# Patient Record
Sex: Female | Born: 2000 | Race: White | Hispanic: No | Marital: Single | Smoking: Former smoker
Health system: Southern US, Community
[De-identification: ages and names within clinical notes are randomized; demographics above are authoritative.]

## PROBLEM LIST (undated history)

## (undated) ENCOUNTER — Inpatient Hospital Stay (HOSPITAL_COMMUNITY): Payer: Self-pay

## (undated) DIAGNOSIS — N289 Disorder of kidney and ureter, unspecified: Secondary | ICD-10-CM

## (undated) DIAGNOSIS — Z8742 Personal history of other diseases of the female genital tract: Secondary | ICD-10-CM

## (undated) DIAGNOSIS — R51 Headache: Secondary | ICD-10-CM

## (undated) DIAGNOSIS — F32A Depression, unspecified: Secondary | ICD-10-CM

## (undated) DIAGNOSIS — R339 Retention of urine, unspecified: Secondary | ICD-10-CM

## (undated) DIAGNOSIS — R000 Tachycardia, unspecified: Secondary | ICD-10-CM

## (undated) DIAGNOSIS — E282 Polycystic ovarian syndrome: Secondary | ICD-10-CM

## (undated) DIAGNOSIS — F419 Anxiety disorder, unspecified: Secondary | ICD-10-CM

## (undated) DIAGNOSIS — R519 Headache, unspecified: Secondary | ICD-10-CM

## (undated) DIAGNOSIS — R87629 Unspecified abnormal cytological findings in specimens from vagina: Secondary | ICD-10-CM

## (undated) HISTORY — DX: Retention of urine, unspecified: R33.9

## (undated) HISTORY — DX: Headache, unspecified: R51.9

## (undated) HISTORY — DX: Tachycardia, unspecified: R00.0

## (undated) HISTORY — PX: NO PAST SURGERIES: SHX2092

## (undated) HISTORY — DX: Headache: R51

---
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
## 2018-01-07 ENCOUNTER — Ambulatory Visit (INDEPENDENT_AMBULATORY_CARE_PROVIDER_SITE_OTHER): Payer: BLUE CROSS/BLUE SHIELD | Admitting: Pediatrics

## 2018-01-07 ENCOUNTER — Encounter (INDEPENDENT_AMBULATORY_CARE_PROVIDER_SITE_OTHER): Payer: Self-pay | Admitting: Pediatrics

## 2018-01-07 VITALS — BP 98/80 | HR 72 | Ht 63.5 in | Wt 133.4 lb

## 2018-01-07 DIAGNOSIS — G44219 Episodic tension-type headache, not intractable: Secondary | ICD-10-CM

## 2018-01-07 DIAGNOSIS — G43009 Migraine without aura, not intractable, without status migrainosus: Secondary | ICD-10-CM

## 2018-01-07 MED ORDER — QUDEXY XR 100 MG PO CS24: EXTENDED_RELEASE_CAPSULE | ORAL | 5 refills | Status: DC

## 2018-01-07 NOTE — Patient Instructions (Signed)
Glad that your headaches are better.  I am concerned about the tingling in your hands.  That reason we are going to switch you to Qudexy or Trokendi.  These are extended release topiramate.  Please check with your pharmacist to see if the co-pay card significantly reduces how much she has to spend.  If we could need to go to the generic I will not hesitate to do so.  I want to know if this gets rid of your numbness and how well is helping her headaches.

## 2018-01-07 NOTE — Progress Notes (Signed)
Patient: Hannah Walker MRN: [id number] Sex: female DOB: 2000/04/11  Provider: Ellison Carwin, MD Location of Care: [facility]  Note type: Routine return visit  History of Present Illness: Referral Source: Suzanna Obey, MD History from: patient and [facility] chart Chief Complaint: Headaches  Hannah Walker is a 17 y.o. female who was evaluated on January 07, 2018 for the first time since October 22, 2017.  The patient has a history of new daily persistent.  She has persistent tension-type headaches and migraines.  Her headaches have dropped down to 2 to 3 per week.  Some are migrainous and others are tension type in nature.  She is having numbness in her hands in a glove distribution that is most prominent when she wakes up in the morning and is in a glove distribution for both hands.  I am certain it is related to topiramate.  The symptoms subside through the day.  Currently, she takes 100 mg of topiramate immediate release with 25 mg tablets.  About once a week, she has a headache that puts her to bed.  About once a month or so she has a headache that causes vomiting.  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  She sleeps 8 hours a night, but often works 16 hour shifts.  Fortunately, despite the long hours, she is doing well.  She wants to go to nursing school and I hope is saving some money and after working for year will be supported by her employers to obtain further education.  In general, her health is good.  She has gained about 7 pounds since I saw her a few months ago.  She has had no other significant medical problems.  Review of Systems: A complete review of systems was remarkable for patient reports that she is down to 2 or 3 headaches a week. She reports that when she takes the Topirimate, her fingers become numb, all other systems reviewed and negative.  Past Medical History Diagnosis Date  . Headache    Hospitalizations:  No., Head Injury: No., Nervous System Infections: No., Immunizations up to date: Yes.    Birth History 6 Lbs.7oz. infant born at [redacted]weeks gestational age to a 19year old g 1p 86female. Gestation wascomplicated bypreterm labor Normal spontaneous vaginal delivery Nursery Course wascomplicated bypremature lungs requiring surfactant Growth and Development wasrecalled asnormal  Behavior History none  Surgical History History reviewed. No pertinent surgical history.  Family History family history is not on file. Family history is negative for migraines, seizures, intellectual disabilities, blindness, deafness, birth defects, chromosomal disorder, or autism.  Social History Socioeconomic History  . Marital status: Single  . Years of education:  35  . Highest education level:  High school graduate  Occupational History  .  Works part-time in an assisted living facility  Social Needs  . Financial resource strain: Not on file  . Food insecurity:    Worry: Not on file    Inability: Not on file  . Transportation needs:    Medical: Not on file    Non-medical: Not on file  Tobacco Use  . Smoking status: Never Smoker  . Smokeless tobacco: Never Used  Substance and Sexual Activity  . Alcohol use: No  . Drug use: No  . 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    She was home schooled.    She  lives with her dad only. She has no siblings.    She enjoys playing with her dogs and being on her phone.   Allergies Allergen Reactions  . Ampicillin Rash    Has patient had a PCN reaction causing immediate rash, facial/tongue/throat swelling, SOB or lightheadedness with hypotension: Yes Has patient had a PCN reaction causing severe rash involving mucus membranes or skin necrosis: No Has patient had a PCN reaction that required hospitalization: No Has patient had a PCN reaction occurring within the last 10 years: Yes If all  of the above answers are "NO", then may proceed with Cephalosporin use.    Physical Exam BP 98/80   Pulse 72   Ht 5' 3.5" (1.613 m)   Wt 133 lb 6.4 oz (60.5 kg)   BMI 23.26 kg/m   General: alert, well developed, well nourished, in no acute distress, sandy hair, brown eyes, right handed Head: normocephalic, no dysmorphic features Ears, Nose and Throat: Otoscopic: tympanic membranes normal; pharynx: oropharynx is pink without exudates or tonsillar hypertrophy Neck: supple, full range of motion, no cranial or cervical bruits Respiratory: auscultation clear Cardiovascular: no murmurs, pulses are normal Musculoskeletal: no skeletal deformities or apparent scoliosis Skin: no rashes or neurocutaneous lesions  Neurologic Exam  Mental Status: alert; oriented to person, place and year; knowledge is normal for age; language is normal Cranial Nerves: visual fields are full to double simultaneous stimuli; extraocular movements are full and conjugate; pupils are round reactive to light; funduscopic examination shows sharp disc margins with normal vessels; symmetric facial strength; midline tongue and uvula; air conduction is greater than bone conduction bilaterally Motor: Normal strength, tone and mass; good fine motor movements; no pronator drift Sensory: intact responses to cold, vibration, proprioception and stereognosis Coordination: good finger-to-nose, rapid repetitive alternating movements and finger apposition Gait and Station: normal gait and station: patient is able to walk on heels, toes and tandem without difficulty; balance is adequate; Romberg exam is negative; Gower response is negative Reflexes: symmetric and diminished bilaterally; no clonus; bilateral flexor plantar responses  Assessment 1. Migraine without aura without status migrainosus, not intractable, G43.009. 2. Episodic tension-type headache, not intractable, G44.219.  Discussion I think that we are making progress with  the topiramate but I do not believe that we can push immediate release.  I have given her co-pay cards for Trokendi and Qudexy and written a prescription for Qudexy ER 100 mg tablets.  I asked her to speak with her pharmacist and find out which would work out best for her.  If both of the trade drugs do not work, then we will go to the generic in which case Qudexy will be fine.  Plan It is my hope that we will continue to see improvement in her headaches and diminution of her numbness.  We will continue her current treatment which includes rescue drugs with tizanidine, eletriptan, Zofran as needed for nausea and vomiting.  I asked her to limit her NSAID use which she has done.  She will return to see me in 3 months' time and will send me her headache calendars for mid July through September.  Greater than 50% of a 25 minute visit was spent in counseling and coordination of care concerning her headaches and dealing with side effects.   Medication List    Accurate as of 01/07/18  2:03 PM.      cetirizine 10 MG tablet Commonly known as:  ZYRTEC Take 1 tablet (10 mg total) by mouth daily.  eletriptan 40 MG tablet Commonly known as:  RELPAX Take 1 tablet (40 mg total) by mouth as needed for migraine or headache. May repeat in 2 hours if headache persists or recurs.   ibuprofen 200 MG tablet Commonly known as:  ADVIL,MOTRIN Take 400 mg by mouth every 6 (six) hours as needed for mild pain or moderate pain.   ipratropium 0.06 % nasal spray Commonly known as:  ATROVENT Place 2 sprays into both nostrils 4 (four) times daily.   ondansetron 4 MG tablet Commonly known as:  ZOFRAN Take 1 tablet as needed for nausea, no more than once daily   tiZANidine 4 MG tablet Commonly known as:  ZANAFLEX TAKE 1 TABLET BY MOUTH AT BEDTIME AS NEEDED FOR SEVERE PAIN   topiramate 25 MG tablet Commonly known as:  TOPAMAX Take 4 tablets at nighttime   TRI-SPRINTEC 0.18/0.215/0.25 MG-35 MCG tablet Generic  drug:  Norgestimate-Ethinyl Estradiol Triphasic TK 1 T PO D    The medication list was reviewed and reconciled. All changes or newly prescribed medications were explained.  A complete medication list was provided to the patient/caregiver.  Deetta Perla MD

## 2018-01-21 ENCOUNTER — Other Ambulatory Visit: Payer: Self-pay

## 2018-01-21 ENCOUNTER — Encounter: Payer: Self-pay | Admitting: Emergency Medicine

## 2018-01-21 ENCOUNTER — Emergency Department: Payer: BLUE CROSS/BLUE SHIELD

## 2018-01-21 ENCOUNTER — Ambulatory Visit
Admission: EM | Admit: 2018-01-21 | Discharge: 2018-01-21 | Disposition: A | Discharge status: Home / Self Care | Payer: BLUE CROSS/BLUE SHIELD | Attending: Emergency Medicine | Admitting: Emergency Medicine

## 2018-01-21 ENCOUNTER — Emergency Department
Admission: EM | Admit: 2018-01-21 | Discharge: 2018-01-21 | Disposition: A | Discharge status: Home / Self Care | Payer: BLUE CROSS/BLUE SHIELD | Attending: Emergency Medicine | Admitting: Emergency Medicine

## 2018-01-21 DIAGNOSIS — B9689 Other specified bacterial agents as the cause of diseases classified elsewhere: Secondary | ICD-10-CM | POA: Diagnosis not present

## 2018-01-21 DIAGNOSIS — Z87442 Personal history of urinary calculi: Secondary | ICD-10-CM | POA: Insufficient documentation

## 2018-01-21 DIAGNOSIS — N83201 Unspecified ovarian cyst, right side: Secondary | ICD-10-CM | POA: Insufficient documentation

## 2018-01-21 DIAGNOSIS — Z79899 Other long term (current) drug therapy: Secondary | ICD-10-CM | POA: Insufficient documentation

## 2018-01-21 DIAGNOSIS — E282 Polycystic ovarian syndrome: Secondary | ICD-10-CM | POA: Insufficient documentation

## 2018-01-21 DIAGNOSIS — R1031 Right lower quadrant pain: Secondary | ICD-10-CM | POA: Insufficient documentation

## 2018-01-21 DIAGNOSIS — R102 Pelvic and perineal pain: Secondary | ICD-10-CM | POA: Diagnosis present

## 2018-01-21 DIAGNOSIS — N760 Acute vaginitis: Secondary | ICD-10-CM | POA: Diagnosis not present

## 2018-01-21 HISTORY — DX: Disorder of kidney and ureter, unspecified: N28.9

## 2018-01-21 LAB — CBC WITH DIFFERENTIAL/PLATELET
Abs Immature Granulocytes: 0.02 10*3/uL (ref 0.00–0.07)
BASOS ABS: 0 10*3/uL (ref 0.0–0.1)
Basophils Relative: 0 %
Eosinophils Absolute: 0.2 10*3/uL (ref 0.0–0.5)
Eosinophils Relative: 2 %
HEMATOCRIT: 40 % (ref 36.0–46.0)
Hemoglobin: 13.7 g/dL (ref 12.0–15.0)
Immature Granulocytes: 0 %
LYMPHS ABS: 3.2 10*3/uL (ref 0.7–4.0)
LYMPHS PCT: 36 %
MCH: 29.6 pg (ref 26.0–34.0)
MCHC: 34.3 g/dL (ref 30.0–36.0)
MCV: 86.4 fL (ref 80.0–100.0)
Monocytes Absolute: 0.6 10*3/uL (ref 0.1–1.0)
Monocytes Relative: 7 %
NEUTROS ABS: 4.9 10*3/uL (ref 1.7–7.7)
NEUTROS PCT: 55 %
NRBC: 0 % (ref 0.0–0.2)
Platelets: 241 10*3/uL (ref 150–400)
RBC: 4.63 MIL/uL (ref 3.87–5.11)
RDW: 13 % (ref 11.5–15.5)
WBC: 8.9 10*3/uL (ref 4.0–10.5)

## 2018-01-21 LAB — CHLAMYDIA/NGC RT PCR (ARMC ONLY)
Chlamydia Tr: NOT DETECTED
N gonorrhoeae: NOT DETECTED

## 2018-01-21 LAB — WET PREP, GENITAL
Sperm: NONE SEEN
TRICH WET PREP: NONE SEEN
Yeast Wet Prep HPF POC: NONE SEEN

## 2018-01-21 LAB — URINALYSIS, COMPLETE (UACMP) WITH MICROSCOPIC
BILIRUBIN URINE: NEGATIVE
Glucose, UA: NEGATIVE mg/dL
Hgb urine dipstick: NEGATIVE
Ketones, ur: NEGATIVE mg/dL
Leukocytes, UA: NEGATIVE
NITRITE: NEGATIVE
PH: 5.5 (ref 5.0–8.0)
Protein, ur: NEGATIVE mg/dL
RBC / HPF: NONE SEEN RBC/hpf (ref 0–5)
SPECIFIC GRAVITY, URINE: 1.02 (ref 1.005–1.030)

## 2018-01-21 LAB — BASIC METABOLIC PANEL
Anion gap: 6 (ref 5–15)
BUN: 16 mg/dL (ref 6–20)
CHLORIDE: 108 mmol/L (ref 98–111)
CO2: 22 mmol/L (ref 22–32)
CREATININE: 0.68 mg/dL (ref 0.44–1.00)
Calcium: 10 mg/dL (ref 8.9–10.3)
GFR calc non Af Amer: >60 mL/min (ref >60)
Glucose, Bld: 109 mg/dL — ABNORMAL HIGH (ref 70–99)
Potassium: 3.5 mmol/L (ref 3.5–5.1)
Sodium: 136 mmol/L (ref 135–145)

## 2018-01-21 LAB — PREGNANCY, URINE: PREG TEST UR: NEGATIVE

## 2018-01-21 MED ORDER — KETOROLAC TROMETHAMINE 60 MG/2ML IM SOLN
30.0000 mg | Freq: Once | INTRAMUSCULAR | Status: AC
  Administered 2018-01-21: 30 mg via INTRAMUSCULAR

## 2018-01-21 MED ORDER — METRONIDAZOLE 500 MG PO TABS: 500.0000 mg | ORAL_TABLET | Freq: Two times a day (BID) | ORAL | 0 refills | Status: AC

## 2018-01-21 NOTE — ED Triage Notes (Signed)
Pt reports that she was at [facility] for abd pain, they did a pelvic, and labs on her, they sent here her because they think her ovaries may giving her the pain or that she may have a kidney stone. They do not have [place] or CT so they sent her her to be evaluated.

## 2018-01-21 NOTE — Discharge Instructions (Addendum)
Go immediately to the [facility] emergency department.  Do not have anything to eat or drink until your evaluation is complete.  You also have bacterial vaginosis, which is not an STD.  It is treated with Flagyl, so I am sending you home with a week of Flagyl.  Do not drink alcohol while taking this medicine.

## 2018-01-21 NOTE — ED Provider Notes (Signed)
HPI  SUBJECTIVE:  Hannah Walker is a 17 y.o. female who presents with 1 week of stabbing, "punching" intermittent right lower quadrant pain that radiates to her right back.  States that the pain is getting more intense, it has not moved since it started.  It lasts approximately 30 minutes and then resolves.  She also reports dysuria, urgency, frequency, hematuria, fevers T-max 100.  Decreased appetite.  She reports increased amount nonodorous, white vaginal discharge.  No vomiting.  No vaginal bleeding, odor, genital rash, itching.  No intercourse in the past week.  She is in a long-term monogamous relationship with a female who is asymptomatic, STDs are not a concern today.  She states that initially this felt like a urinary tract infection, but then it now feels more like a kidney stone.  She is on day #4 of a leftover Keflex scription 500 mg twice daily.  Symptoms are better with moving around, worse with lying down/sitting still.  She states that the car ride over here was painful.  She has a past medical history of UTIs, nonobstructing nephrolithiasis was confirmed on ultrasound.  No history of pyelonephritis.  Also history of migraines, PCOS.  She states that this does not feel like a ruptured cyst.  No history of diabetes, hypertension, abdominal surgeries, gonorrhea, chlamydia, HIV, HSV, syphilis, Trichomonas, BV, yeast.  No history of ectopic pregnancies, PID.  LMP: 10/13.  PMD: None.    Past Medical History:  Diagnosis Date  . Headache   . Renal disorder    kidney stones    Past Surgical History:  Procedure Laterality Date  . NO PAST SURGERIES      Family History  Problem Relation Age of Onset  . Healthy Mother   . Healthy Father     Social History   Tobacco Use  . Smoking status: Never Smoker  . Smokeless tobacco: Never Used  Substance Use Topics  . Alcohol use: No  . Drug use: No    No current facility-administered medications for this encounter.   Current Outpatient  Medications:  .  cetirizine (ZYRTEC) 10 MG tablet, Take 1 tablet (10 mg total) by mouth daily., Disp: 15 tablet, Rfl: 0 .  eletriptan (RELPAX) 40 MG tablet, Take 1 tablet (40 mg total) by mouth as needed for migraine or headache. May repeat in 2 hours if headache persists or recurs., Disp: 10 tablet, Rfl: 5 .  ipratropium (ATROVENT) 0.06 % nasal spray, Place 2 sprays into both nostrils 4 (four) times daily., Disp: 15 mL, Rfl: 0 .  ondansetron (ZOFRAN) 4 MG tablet, Take 1 tablet as needed for nausea, no more than once daily, Disp: 20 tablet, Rfl: 5 .  QUDEXY XR CS24 sprinkle capsule, Take 1 tablet at nighttime., Disp: 30 each, Rfl: 5 .  tiZANidine (ZANAFLEX) 4 MG tablet, TAKE 1 TABLET BY MOUTH AT BEDTIME AS NEEDED FOR SEVERE PAIN, Disp: 10 tablet, Rfl: 5 .  TRI-SPRINTEC 0.18/0.215/0.25 MG-35 MCG tablet, TK 1 T PO D, Disp: , Rfl: 12 .  metroNIDAZOLE (FLAGYL) 500 MG tablet, Take 1 tablet (500 mg total) by mouth 2 (two) times daily for 7 days., Disp: 14 tablet, Rfl: 0  Allergies  Allergen Reactions  . Ampicillin Rash    Has patient had a PCN reaction causing immediate rash, facial/tongue/throat swelling, SOB or lightheadedness with hypotension: Yes Has patient had a PCN reaction causing severe rash involving mucus membranes or skin necrosis: No Has patient had a PCN reaction that required hospitalization: No Has  patient had a PCN reaction occurring within the last 10 years: Yes If all of the above answers are "NO", then may proceed with Cephalosporin use.      ROS  As noted in HPI.   Physical Exam  BP 105/65 (BP Location: Left Arm)   Pulse 98   Temp 98.2 F (36.8 C) (Oral)   Resp 18   Ht 5\' 5"  (1.651 m)   Wt 59.9 kg   LMP 01/11/2018 (Approximate)   SpO2 99%   BMI 21.97 kg/m   Constitutional: Well developed, well nourished, no acute distress Eyes:  EOMI, conjunctiva normal bilaterally HENT: Normocephalic, atraumatic,mucus membranes moist Respiratory: Normal inspiratory  effort Cardiovascular: Normal rate GI: Normal appearance, soft, positive right lower quadrant tenderness with no guarding or rebound.  Negative Murphy, negative Rovsing.  Active bowel sounds.  Nondistended.  No flank tenderness Back: Mild right CVAT Pelvic: Normal external genitalia.  Normal os, scant white vaginal discharge.  Uterus nontender.  No CMT.  No appreciable adnexal masses.  Positive right adnexal tenderness.  Chaperone present during exam. skin: No rash, skin intact Musculoskeletal: no deformities Neurologic: Alert & oriented x 3, no focal neuro deficits Psychiatric: Speech and behavior appropriate   ED Course   Medications  ketorolac (TORADOL) injection 30 mg (30 mg Intramuscular Given 01/21/18 1142)    Orders Placed This Encounter  Procedures  . Pelvic exam    Standing Status:   Standing    Number of Occurrences:   1  . Wet prep, genital    Standing Status:   Standing    Number of Occurrences:   1    Order Specific Question:   Patient immune status    Answer:   Normal  . Chlamydia/NGC rt PCR    Standing Status:   Standing    Number of Occurrences:   1    Order Specific Question:   Patient immune status    Answer:   Normal  . Urine culture    Standing Status:   Standing    Number of Occurrences:   1    Order Specific Question:   Patient immune status    Answer:   Normal  . Urinalysis, Complete w Microscopic    Standing Status:   Standing    Number of Occurrences:   1  . CBC with Differential    Standing Status:   Standing    Number of Occurrences:   1  . Basic metabolic panel    Standing Status:   Standing    Number of Occurrences:   1  . Pregnancy, urine    Standing Status:   Standing    Number of Occurrences:   1    Results for orders placed or performed during the hospital encounter of 01/21/18 (from the past 24 hour(s))  Urinalysis, Complete w Microscopic     Status: Abnormal   Collection Time: 01/21/18 10:14 AM  Result Value Ref Range   Color,  Urine YELLOW YELLOW   APPearance CLEAR CLEAR   Specific Gravity, Urine 1.020 1.005 - 1.030   pH 5.5 5.0 - 8.0   Glucose, UA NEGATIVE NEGATIVE mg/dL   Hgb urine dipstick NEGATIVE NEGATIVE   Bilirubin Urine NEGATIVE NEGATIVE   Ketones, ur NEGATIVE NEGATIVE mg/dL   Protein, ur NEGATIVE NEGATIVE mg/dL   Nitrite NEGATIVE NEGATIVE   Leukocytes, UA NEGATIVE NEGATIVE   Squamous Epithelial / LPF 0-5 0 - 5   WBC, UA 0-5 0 - 5 WBC/hpf  RBC / HPF NONE SEEN 0 - 5 RBC/hpf   Bacteria, UA RARE (A) NONE SEEN  Pregnancy, urine     Status: None   Collection Time: 01/21/18 10:14 AM  Result Value Ref Range   Preg Test, Ur NEGATIVE NEGATIVE  CBC with Differential     Status: None   Collection Time: 01/21/18 10:59 AM  Result Value Ref Range   WBC 8.9 4.0 - 10.5 K/uL   RBC 4.63 3.87 - 5.11 MIL/uL   Hemoglobin 13.7 12.0 - 15.0 g/dL   HCT 40.9 81.1 - 91.4 %   MCV 86.4 80.0 - 100.0 fL   MCH 29.6 26.0 - 34.0 pg   MCHC 34.3 30.0 - 36.0 g/dL   RDW 78.2 95.6 - 21.3 %   Platelets 241 150 - 400 K/uL   nRBC 0.0 0.0 - 0.2 %   Neutrophils Relative % 55 %   Neutro Abs 4.9 1.7 - 7.7 K/uL   Lymphocytes Relative 36 %   Lymphs Abs 3.2 0.7 - 4.0 K/uL   Monocytes Relative 7 %   Monocytes Absolute 0.6 0.1 - 1.0 K/uL   Eosinophils Relative 2 %   Eosinophils Absolute 0.2 0.0 - 0.5 K/uL   Basophils Relative 0 %   Basophils Absolute 0.0 0.0 - 0.1 K/uL   Immature Granulocytes 0 %   Abs Immature Granulocytes 0.02 0.00 - 0.07 K/uL  Basic metabolic panel     Status: Abnormal   Collection Time: 01/21/18 10:59 AM  Result Value Ref Range   Sodium 136 135 - 145 mmol/L   Potassium 3.5 3.5 - 5.1 mmol/L   Chloride 108 98 - 111 mmol/L   CO2 22 22 - 32 mmol/L   Glucose, Bld 109 (H) 70 - 99 mg/dL   BUN 16 6 - 20 mg/dL   Creatinine, Ser 0.86 0.44 - 1.00 mg/dL   Calcium 57.8 8.9 - 46.9 mg/dL   GFR calc non Af Amer >60 >60 mL/min   GFR calc Af Amer >60 >60 mL/min   Anion gap 6 5 - 15  Wet prep, genital     Status:  Abnormal   Collection Time: 01/21/18 10:59 AM  Result Value Ref Range   Yeast Wet Prep HPF POC NONE SEEN NONE SEEN   Trich, Wet Prep NONE SEEN NONE SEEN   Clue Cells Wet Prep HPF POC PRESENT (A) NONE SEEN   WBC, Wet Prep HPF POC MANY (A) NONE SEEN   Sperm NONE SEEN    No results found.  ED Clinical Impression  Right lower quadrant abdominal pain  BV (bacterial vaginosis)   ED Assessment/Plan  In the differential is nephrolithiasis, ruptured ovarian cyst, appendicitis, much lower on the differential is ovarian torsion.  Doubt PID.  Checking UA, urine pregnancy, CBC, BMP, wet prep, gonorrhea, chlamydia.  Do not think that we need to treat empirically for gonorrhea chlamydia today.  Giving Toradol 30 mg IM.  Patient declined nausea medicine.  UA with rare bacteria, negative for UTI, hematuria.  Sending off for culture to confirm absence of UTI.  BMP, CBC normal.  Patient has BV.  Urine pregnancy negative.  Sending to the [facility] emergency department.  I think that the patient would benefit from additional work-up/advanced imaging to rule out the above.  We will give patient copy of her labs and also prescription for Flagyl for the BV.  Discussed labs, MDM, treatment plan, and rationale for transfer to the emergency department via private vehicle with the patient.  She agrees with plan.   Meds ordered this encounter  Medications  . ketorolac (TORADOL) injection 30 mg  . metroNIDAZOLE (FLAGYL) 500 MG tablet    Sig: Take 1 tablet (500 mg total) by mouth 2 (two) times daily for 7 days.    Dispense:  14 tablet    Refill:  0    *This clinic note was created using Scientist, clinical (histocompatibility and immunogenetics). Therefore, there may be occasional mistakes despite careful proofreading.   ?    Domenick Gong, MD 01/21/18 1155

## 2018-01-21 NOTE — ED Provider Notes (Signed)
[facility] Emergency Department Provider Note  Time seen: 3:02 PM  I have reviewed the triage vital signs and the nursing notes.   HISTORY  Chief Complaint Abdominal Pain    HPI Hannah Walker is a 17 y.o. female the past medical history of migraines, presents emergency department for right lower quadrant abdominal pain.  According to the patient for the past 1 week she has had fairly constant pain in her right pelvis.  States a very slight amount of vaginal discharge.  Denies any bleeding.  Denies any hematuria, did state a slight burning sensation earlier in the week which is resolved.  No nausea vomiting or diarrhea.  No known fever.  Describes the pain as a sharp type pain located very low in the right lower quadrant.   Past Medical History:  Diagnosis Date  . Headache   . Renal disorder    kidney stones    Patient Active Problem List   Diagnosis Date Noted  . Episodic tension-type headache, not intractable 01/07/2018  . Migraine without aura and without status migrainosus, not intractable 07/23/2017  . New daily persistent headache 02/27/2017  . Concussion without loss of consciousness 02/27/2017    Past Surgical History:  Procedure Laterality Date  . NO PAST SURGERIES      Prior to Admission medications   Medication Sig Start Date End Date Taking? Authorizing Provider  cetirizine (ZYRTEC) 10 MG tablet Take 1 tablet (10 mg total) by mouth daily. 10/27/17   Cathie Hoops, Amy V, PA-C  eletriptan (RELPAX) 40 MG tablet Take 1 tablet (40 mg total) by mouth as needed for migraine or headache. May repeat in 2 hours if headache persists or recurs. 10/22/17   Deetta Perla, MD  ipratropium (ATROVENT) 0.06 % nasal spray Place 2 sprays into both nostrils 4 (four) times daily. 10/27/17   Cathie Hoops, Amy V, PA-C  metroNIDAZOLE (FLAGYL) 500 MG tablet Take 1 tablet (500 mg total) by mouth 2 (two) times daily for 7 days. 01/21/18 01/28/18  Domenick Gong, MD  ondansetron  (ZOFRAN) 4 MG tablet Take 1 tablet as needed for nausea, no more than once daily 10/22/17   Deetta Perla, MD  QUDEXY XR CS24 sprinkle capsule Take 1 tablet at nighttime. 01/07/18   Deetta Perla, MD  tiZANidine (ZANAFLEX) 4 MG tablet TAKE 1 TABLET BY MOUTH AT BEDTIME AS NEEDED FOR SEVERE PAIN 10/22/17   Deetta Perla, MD  TRI-SPRINTEC 0.18/0.215/0.25 MG-35 MCG tablet TK 1 T PO D 03/27/17   [provider]    Allergies  Allergen Reactions  . Ampicillin Rash    Has patient had a PCN reaction causing immediate rash, facial/tongue/throat swelling, SOB or lightheadedness with hypotension: Yes Has patient had a PCN reaction causing severe rash involving mucus membranes or skin necrosis: No Has patient had a PCN reaction that required hospitalization: No Has patient had a PCN reaction occurring within the last 10 years: Yes If all of the above answers are "NO", then may proceed with Cephalosporin use.     Family History  Problem Relation Age of Onset  . Healthy Mother   . Healthy Father     Social History Social History   Tobacco Use  . Smoking status: Never Smoker  . Smokeless tobacco: Never Used  Substance Use Topics  . Alcohol use: No  . Drug use: No    Review of Systems Constitutional: Negative for fever. Cardiovascular: Negative for chest pain. Respiratory: Negative for shortness of breath.  Gastrointestinal: Right lower quadrant/right pelvis pain.  Negative for nausea vomiting diarrhea Genitourinary: Negative for dysuria/hematuria Musculoskeletal: Negative for musculoskeletal complaints Skin: Negative for skin complaints  Neurological: Negative for headache All other ROS negative  ____________________________________________   PHYSICAL EXAM:  VITAL SIGNS: ED Triage Vitals  Enc Vitals Group     BP 01/21/18 1227 119/70     Pulse Rate 01/21/18 1227 98     Resp 01/21/18 1227 20     Temp 01/21/18 1227 97.7 F (36.5 C)     Temp Source  01/21/18 1227 Oral     SpO2 01/21/18 1227 99 %     Weight 01/21/18 1228 132 lb (59.9 kg)     Height 01/21/18 1228 5\' 5"  (1.651 m)     Head Circumference --      Peak Flow --      Pain Score 01/21/18 1228 4     Pain Loc --      Pain Edu? --      Excl. in GC? --    Constitutional: Alert and oriented. Well appearing and in no distress. Eyes: Normal exam ENT   Head: Normocephalic and atraumatic.   Mouth/Throat: Mucous membranes are moist. Cardiovascular: Normal rate, regular rhythm. No murmur Respiratory: Normal respiratory effort without tachypnea nor retractions. Breath sounds are clear Gastrointestinal: Soft, mild to moderate tenderness in the very low right lower quadrant/right pelvis.  No significant discomfort over McBurney's point.  Abdomen otherwise benign.  No rebound guarding or distention. Musculoskeletal: Nontender with normal range of motion in all extremities. Neurologic:  Normal speech and language. No gross focal neurologic deficits Skin:  Skin is warm, dry and intact.  Psychiatric: Mood and affect are normal.   ____________________________________________    RADIOLOGY  Ultrasound shows 3.5 cm hemorrhagic cyst of the right ovary.  ____________________________________________   INITIAL IMPRESSION / ASSESSMENT AND PLAN / ED COURSE  Pertinent labs & imaging results that were available during my care of the patient were reviewed by me and considered in my medical decision making (see chart for details).  Patient presents the emergency department for very low right lower quadrant pain/right pelvis pain ongoing x1 week.  Patient was seen earlier today admit been urgent care and referred to the emergency department.  Patient had lab work performed at [facility] urgent care showing normal results including a normal chemistry and largely normal CBC with a normal white blood cell count.  Urinalysis also appeared normal.  Patient had a vaginal exam performed with wet prep  showing bacterial vaginitis and she was prescribed Flagyl.  I have reviewed the patient's notes and the patient was also tested for gonorrhea/chlamydia which is pending.  Given the patient's tenderness across the very low right lower quadrant/right pelvis I believe the patient would benefit from an ultrasound to help rule out ovarian pathology such as cyst, hemorrhagic cyst or torsion.  I believe appendicitis is less likely given no fever, normal white blood cell count and symptoms ongoing x1 week.  Does not appear suspicious for ureterolithiasis given no hematuria constant type pain.  We will obtain ultrasound and reassess.  Ultrasound shows likely hemorrhagic cyst of the right ovary.  I believe this explains the patient's symptoms quite well as well as clinical exam findings.  Discussed the findings with patient as well as mother, recommend repeat ultrasound in 6 to 12 weeks to ensure that the cyst has resolved.  ____________________________________________   FINAL CLINICAL IMPRESSION(S) / ED DIAGNOSES  Right pelvis pain Hemorrhagic cyst.  Minna Antis, MD 01/21/18 [phone]

## 2018-01-21 NOTE — Discharge Instructions (Addendum)
Please use Tylenol or ibuprofen as needed for pain/discomfort as written on the box.  Please follow-up with your OB/GYN or primary care doctor for a repeat ultrasound in 6 to 12 weeks to ensure resolution of your ovarian cyst.

## 2018-01-21 NOTE — ED Triage Notes (Signed)
Pt c/o lower back pain, pelvic pain on the right side, dysuria, nausea, urinary frequency, urinary rentention, and low grade fevers. Started about a week ago. Pt has a h/o kidney stones

## 2018-01-22 ENCOUNTER — Telehealth: Payer: Self-pay | Admitting: Emergency Medicine

## 2018-01-22 NOTE — Telephone Encounter (Signed)
GC/C are negative.  Urine culture still pending call when all results are in.

## 2018-01-23 ENCOUNTER — Telehealth: Payer: Self-pay | Admitting: Emergency Medicine

## 2018-01-23 LAB — URINE CULTURE: Special Requests: NORMAL

## 2018-01-23 NOTE — Telephone Encounter (Signed)
Called patient, demographics verified, results communicated, patient states she is still having some painful urination.  Patient encouraged to follow up, patient states she will follow back up with [place] or with her OBGYN.

## 2018-01-26 ENCOUNTER — Encounter (INDEPENDENT_AMBULATORY_CARE_PROVIDER_SITE_OTHER): Payer: Self-pay

## 2018-01-27 NOTE — Telephone Encounter (Signed)
I spoke with [facility].  She is having significant breakthrough bleeding at a time when she is not having her menstrual.  She also had a ruptured ovarian cyst.  I do not believe that Qudexy has anything to do with either of these.  She did not have any problems with topiramate immediate release.  I recommended that she continue the medication and see her GYN as planned on November 4 check to be certain that she is not significantly anemic and inform me if the bleeding continues on.  Ultimately the only way Qudexy could be affecting her bleeding is if it somehow affecting her birth control.

## 2018-02-15 ENCOUNTER — Other Ambulatory Visit: Payer: Self-pay

## 2018-02-15 ENCOUNTER — Ambulatory Visit (HOSPITAL_COMMUNITY)
Admission: EM | Admit: 2018-02-15 | Discharge: 2018-02-15 | Disposition: A | Discharge status: Home / Self Care | Payer: BLUE CROSS/BLUE SHIELD | Attending: Family Medicine | Admitting: Family Medicine

## 2018-02-15 ENCOUNTER — Encounter (HOSPITAL_COMMUNITY): Payer: Self-pay | Admitting: Family Medicine

## 2018-02-15 DIAGNOSIS — R05 Cough: Secondary | ICD-10-CM | POA: Diagnosis present

## 2018-02-15 DIAGNOSIS — J069 Acute upper respiratory infection, unspecified: Secondary | ICD-10-CM | POA: Diagnosis not present

## 2018-02-15 DIAGNOSIS — B9789 Other viral agents as the cause of diseases classified elsewhere: Secondary | ICD-10-CM | POA: Diagnosis not present

## 2018-02-15 DIAGNOSIS — J029 Acute pharyngitis, unspecified: Secondary | ICD-10-CM | POA: Insufficient documentation

## 2018-02-15 LAB — POCT RAPID STREP A: Streptococcus, Group A Screen (Direct): NEGATIVE

## 2018-02-15 MED ORDER — BENZONATATE 100 MG PO CAPS: 100.0000 mg | ORAL_CAPSULE | Freq: Three times a day (TID) | ORAL | 0 refills | Status: DC | PRN

## 2018-02-15 NOTE — Discharge Instructions (Signed)
Strep test is negative.

## 2018-02-15 NOTE — ED Provider Notes (Signed)
[facility]    CSN: [id number] Arrival date & time: 02/15/18  1216     History   Chief Complaint Chief Complaint  Patient presents with  . Cough  . Sore Throat    HPI [facility] is a 17 y.o. female.   [facility] with a complaint of a sore throat, cough and nasal congestion x 3 days.  Patient works as a [place]  She is accompanied by her mother today.     Past Medical History:  Diagnosis Date  . Headache   . Renal disorder    kidney stones    Patient Active Problem List   Diagnosis Date Noted  . Episodic tension-type headache, not intractable 01/07/2018  . Migraine without aura and without status migrainosus, not intractable 07/23/2017  . New daily persistent headache 02/27/2017  . Concussion without loss of consciousness 02/27/2017    Past Surgical History:  Procedure Laterality Date  . NO PAST SURGERIES      OB History   None      Home Medications    Prior to Admission medications   Medication Sig Start Date End Date Taking? Authorizing Provider  benzonatate (TESSALON) 100 MG capsule Take 1-2 capsules (100-200 mg total) by mouth 3 (three) times daily as needed for cough. 02/15/18   Elvina SidleLauenstein, Raylin Diguglielmo, MD  cetirizine (ZYRTEC) 10 MG tablet Take 1 tablet (10 mg total) by mouth daily. 10/27/17   Cathie HoopsYu, Amy V, PA-C  eletriptan (RELPAX) 40 MG tablet Take 1 tablet (40 mg total) by mouth as needed for migraine or headache. May repeat in 2 hours if headache persists or recurs. 10/22/17   Deetta PerlaHickling, William H, MD  ipratropium (ATROVENT) 0.06 % nasal spray Place 2 sprays into both nostrils 4 (four) times daily. 10/27/17   Cathie HoopsYu, Amy V, PA-C  ondansetron (ZOFRAN) 4 MG tablet Take 1 tablet as needed for nausea, no more than once daily 10/22/17   Deetta PerlaHickling, William H, MD  QUDEXY XR CS24 sprinkle capsule Take 1 tablet at nighttime. 01/07/18   Deetta PerlaHickling, William H, MD  tiZANidine (ZANAFLEX) 4 MG tablet TAKE 1 TABLET BY MOUTH AT BEDTIME AS  NEEDED FOR SEVERE PAIN 10/22/17   Deetta PerlaHickling, William H, MD  TRI-SPRINTEC 0.18/0.215/0.25 MG-35 MCG tablet TK 1 T PO D 03/27/17   [provider]    Family History Family History  Problem Relation Age of Onset  . Healthy Mother   . Healthy Father     Social History Social History   Tobacco Use  . Smoking status: Never Smoker  . Smokeless tobacco: Never Used  Substance Use Topics  . Alcohol use: No  . Drug use: No     Allergies   Ampicillin   Review of Systems Review of Systems   Physical Exam Triage Vital Signs ED Triage Vitals [02/15/18 1223]  Enc Vitals Group     BP      Pulse      Resp      Temp      Temp src      SpO2      Weight      Height      Head Circumference      Peak Flow      Pain Score 9     Pain Loc      Pain Edu?      Excl. in GC?    No data found.  Updated Vital Signs BP 118/79 (BP  Location: Right Arm)   Pulse 90   Temp 98.3 F (36.8 C) (Oral)   Resp 16   LMP 02/11/2018   SpO2 98%    Physical Exam  Constitutional: She is oriented to person, place, and time. She appears well-developed and well-nourished.  HENT:  Head: Normocephalic and atraumatic.  Right Ear: Hearing, tympanic membrane and ear canal normal.  Left Ear: Hearing, tympanic membrane and ear canal normal.  Mouth/Throat: Posterior oropharyngeal edema and posterior oropharyngeal erythema present.  Eyes: Pupils are equal, round, and reactive to light. EOM are normal.  Neck: Normal range of motion. Neck supple.  Cardiovascular: Normal rate, regular rhythm and normal heart sounds.  Pulmonary/Chest: Effort normal and breath sounds normal.  Neurological: She is alert and oriented to person, place, and time.  Skin: Skin is warm and dry.  Psychiatric: She has a normal mood and affect. Her behavior is normal.  Nursing note and vitals reviewed.    UC Treatments / Results  Labs (all labs ordered are listed, but only abnormal results are displayed) Labs Reviewed -  No data to display  EKG None  Radiology No results found.  Procedures Procedures (including critical care time)  Medications Ordered in UC Medications - No data to display  Initial Impression / Assessment and Plan / UC Course  I have reviewed the triage vital signs and the nursing notes.  Pertinent labs & imaging results that were available during my care of the patient were reviewed by me and considered in my medical decision making (see chart for details).    Final Clinical Impressions(s) / UC Diagnoses   Final diagnoses:  Viral URI with cough     Discharge Instructions     Strep test is negative.    ED Prescriptions    Medication Sig Dispense Auth. Provider   benzonatate (TESSALON) 100 MG capsule Take 1-2 capsules (100-200 mg total) by mouth 3 (three) times daily as needed for cough. 20 capsule Elvina Sidle, MD     Controlled Substance Prescriptions [place] Controlled Substance Registry consulted? Not Applicable   Elvina Sidle, MD 02/15/18 1247

## 2018-02-15 NOTE — ED Triage Notes (Addendum)
[facility] a [facility] of a sore throat, cough and nasal congestion x 3 days. The patient denied any measured fever at home.

## 2018-02-17 LAB — CULTURE, GROUP A STREP (THRC)

## 2018-03-29 ENCOUNTER — Ambulatory Visit (HOSPITAL_COMMUNITY)
Admission: EM | Admit: 2018-03-29 | Discharge: 2018-03-29 | Disposition: A | Discharge status: Home / Self Care | Payer: BLUE CROSS/BLUE SHIELD | Attending: Family Medicine | Admitting: Family Medicine

## 2018-03-29 ENCOUNTER — Other Ambulatory Visit: Payer: Self-pay

## 2018-03-29 ENCOUNTER — Encounter (HOSPITAL_COMMUNITY): Payer: Self-pay | Admitting: *Deleted

## 2018-03-29 DIAGNOSIS — R197 Diarrhea, unspecified: Secondary | ICD-10-CM | POA: Diagnosis not present

## 2018-03-29 DIAGNOSIS — G43001 Migraine without aura, not intractable, with status migrainosus: Secondary | ICD-10-CM | POA: Diagnosis not present

## 2018-03-29 MED ORDER — KETOROLAC TROMETHAMINE 60 MG/2ML IM SOLN
60.0000 mg | Freq: Once | INTRAMUSCULAR | Status: AC
  Administered 2018-03-29: 60 mg via INTRAMUSCULAR

## 2018-03-29 MED ORDER — METOCLOPRAMIDE HCL 5 MG/ML IJ SOLN
INTRAMUSCULAR | Status: AC
  Filled 2018-03-29: qty 2

## 2018-03-29 MED ORDER — DIPHENHYDRAMINE HCL 25 MG PO CAPS
ORAL_CAPSULE | ORAL | Status: AC
  Filled 2018-03-29: qty 1

## 2018-03-29 MED ORDER — DIPHENHYDRAMINE HCL 25 MG PO CAPS
25.0000 mg | ORAL_CAPSULE | Freq: Once | ORAL | Status: AC
  Administered 2018-03-29: 25 mg via ORAL

## 2018-03-29 MED ORDER — KETOROLAC TROMETHAMINE 60 MG/2ML IM SOLN
INTRAMUSCULAR | Status: AC
  Filled 2018-03-29: qty 2

## 2018-03-29 MED ORDER — METOCLOPRAMIDE HCL 5 MG/ML IJ SOLN
10.0000 mg | Freq: Once | INTRAMUSCULAR | Status: AC
  Administered 2018-03-29: 10 mg via INTRAMUSCULAR

## 2018-03-29 NOTE — ED Triage Notes (Signed)
C/O left earache and left HA x 4 days.  STates norovirus at work; started with diarrhea yesterday and emesis x 1.

## 2018-03-29 NOTE — Discharge Instructions (Signed)
Return if any problems.

## 2018-03-29 NOTE — ED Provider Notes (Signed)
[facility]    CSN: [id number] Arrival date & time: 03/29/18  1740     History   Chief Complaint Chief Complaint  Patient presents with  . Ear Pain  . Headache  . Diarrhea    HPI [name] Kafer is a 17 y.o. female.   The history is provided by the patient. No language interpreter was used.  Headache  Pain location:  Frontal Quality:  Sharp Radiates to:  Does not radiate Timing:  Constant Progression:  Worsening Chronicity:  New Similar to prior headaches: yes   Relieved by:  Nothing Worsened by:  Nothing Ineffective treatments:  Prescription medications Associated symptoms: diarrhea   Diarrhea  Associated symptoms: headaches     Past Medical History:  Diagnosis Date  . Headache   . Renal disorder    kidney stones    Patient Active Problem List   Diagnosis Date Noted  . Episodic tension-type headache, not intractable 01/07/2018  . Migraine without aura and without status migrainosus, not intractable 07/23/2017  . New daily persistent headache 02/27/2017  . Concussion without loss of consciousness 02/27/2017    Past Surgical History:  Procedure Laterality Date  . NO PAST SURGERIES      OB History   No obstetric history on file.      Home Medications    Prior to Admission medications   Medication Sig Start Date End Date Taking? Authorizing Provider  cetirizine (ZYRTEC) 10 MG tablet Take 1 tablet (10 mg total) by mouth daily. 10/27/17  Yes Yu, Amy V, PA-C  eletriptan (RELPAX) 40 MG tablet Take 1 tablet (40 mg total) by mouth as needed for migraine or headache. May repeat in 2 hours if headache persists or recurs. 10/22/17  Yes Deetta PerlaHickling, William H, MD  tiZANidine (ZANAFLEX) 4 MG tablet TAKE 1 TABLET BY MOUTH AT BEDTIME AS NEEDED FOR SEVERE PAIN 10/22/17  Yes Deetta PerlaHickling, William H, MD  TRI-SPRINTEC 0.18/0.215/0.25 MG-35 MCG tablet TK 1 T PO D 03/27/17  Yes [provider]  benzonatate (TESSALON) 100 MG capsule Take 1-2 capsules  (100-200 mg total) by mouth 3 (three) times daily as needed for cough. 02/15/18   Elvina SidleLauenstein, Kurt, MD  ondansetron (ZOFRAN) 4 MG tablet Take 1 tablet as needed for nausea, no more than once daily 10/22/17   Deetta PerlaHickling, William H, MD  QUDEXY XR CS24 sprinkle capsule Take 1 tablet at nighttime. 01/07/18   Deetta PerlaHickling, William H, MD    Family History Family History  Problem Relation Age of Onset  . Healthy Mother   . Healthy Father     Social History Social History   Tobacco Use  . Smoking status: Never Smoker  . Smokeless tobacco: Never Used  Substance Use Topics  . Alcohol use: No  . Drug use: No     Allergies   Ampicillin   Review of Systems Review of Systems  Gastrointestinal: Positive for diarrhea.  Neurological: Positive for headaches.  All other systems reviewed and are negative.    Physical Exam Triage Vital Signs ED Triage Vitals [03/29/18 1843]  Enc Vitals Group     BP 101/67     Pulse Rate 84     Resp 16     Temp 97.8 F (36.6 C)     Temp Source Oral     SpO2 95 %     Weight      Height      Head Circumference      Peak Flow  Pain Score 7     Pain Loc      Pain Edu?      Excl. in GC?    No data found.  Updated Vital Signs BP 101/67   Pulse 84   Temp 97.8 F (36.6 C) (Oral)   Resp 16   SpO2 95%   Visual Acuity Right Eye Distance:   Left Eye Distance:   Bilateral Distance:    Right Eye Near:   Left Eye Near:    Bilateral Near:     Physical Exam Vitals signs and nursing note reviewed.  Constitutional:      Appearance: She is well-developed.  HENT:     Head: Normocephalic.     Mouth/Throat:     Mouth: Mucous membranes are moist.  Eyes:     Extraocular Movements: Extraocular movements intact.  Neck:     Musculoskeletal: Normal range of motion.  Cardiovascular:     Rate and Rhythm: Normal rate.     Heart sounds: Normal heart sounds.  Pulmonary:     Effort: Pulmonary effort is normal.  Abdominal:     General: There is no  distension.  Musculoskeletal: Normal range of motion.  Skin:    General: Skin is warm.  Neurological:     Mental Status: She is alert and oriented to person, place, and time.  Psychiatric:        Mood and Affect: Mood normal.      UC Treatments / Results  Labs (all labs ordered are listed, but only abnormal results are displayed) Labs Reviewed - No data to display  EKG None  Radiology No results found.  Procedures Procedures (including critical care time)  Medications Ordered in UC Medications  ketorolac (TORADOL) injection 60 mg (has no administration in time range)  metoCLOPramide (REGLAN) injection 10 mg (has no administration in time range)  diphenhydrAMINE (BENADRYL) capsule 25 mg (has no administration in time range)    Initial Impression / Assessment and Plan / UC Course  I have reviewed the triage vital signs and the nursing notes.  Pertinent labs & imaging results that were available during my care of the patient were reviewed by me and considered in my medical decision making (see chart for details).     MDM  Pt probable has viral illness. Pt has been exposed to norovirus  Diarrhea has stopped.  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  I will try migraine cocktail Final Clinical Impressions(s) / UC Diagnoses   Final diagnoses:  Migraine without aura and with status migrainosus, not intractable  Diarrhea, unspecified type   Discharge Instructions   None    ED Prescriptions    None     Controlled Substance Prescriptions [place] Controlled Substance Registry consulted? Not Applicable   Osie CheeksSofia, Wm Sahagun K, PA-C 03/29/18 1904

## 2018-04-15 ENCOUNTER — Ambulatory Visit (INDEPENDENT_AMBULATORY_CARE_PROVIDER_SITE_OTHER): Payer: Medicaid Other | Admitting: Pediatrics

## 2018-04-16 ENCOUNTER — Encounter (INDEPENDENT_AMBULATORY_CARE_PROVIDER_SITE_OTHER): Payer: Self-pay | Admitting: Pediatrics

## 2018-04-16 ENCOUNTER — Ambulatory Visit (INDEPENDENT_AMBULATORY_CARE_PROVIDER_SITE_OTHER): Payer: BLUE CROSS/BLUE SHIELD | Admitting: Pediatrics

## 2018-04-16 VITALS — BP 98/68 | HR 76 | Ht 63.5 in | Wt 144.0 lb

## 2018-04-16 DIAGNOSIS — G43009 Migraine without aura, not intractable, without status migrainosus: Secondary | ICD-10-CM | POA: Diagnosis not present

## 2018-04-16 DIAGNOSIS — G44219 Episodic tension-type headache, not intractable: Secondary | ICD-10-CM

## 2018-04-16 MED ORDER — RIZATRIPTAN BENZOATE 10 MG PO TBDP: ORAL_TABLET | ORAL | 5 refills | Status: DC

## 2018-04-16 NOTE — Patient Instructions (Addendum)
I am sorry your headaches are problematic.  We talked about beta-blockers which are blood pressure medications.  Examples of this would be propranolol, atenolol, metoprolol, pindolol, timolol.  A newly released medication which is a monoclonal antibody shot once a month is called Aimovig.  Is not going to interfere with your contraceptives or any other medication.  It can cause problems with constipation and a funny tingling that usually improves.  Look these up and let me know what you want to do.  The beta-blockers are going to be fairly safe for you for all your life if they work.  I do not think the same can be said for Aimovig with pregnancy.  As we talked about please use My Chart to send me the old calendars and to send me completed charts monthly until I see you again.
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
## 2018-05-02 ENCOUNTER — Encounter (INDEPENDENT_AMBULATORY_CARE_PROVIDER_SITE_OTHER): Payer: Self-pay

## 2018-05-13 ENCOUNTER — Ambulatory Visit (INDEPENDENT_AMBULATORY_CARE_PROVIDER_SITE_OTHER): Payer: BLUE CROSS/BLUE SHIELD | Admitting: Family

## 2018-05-15 ENCOUNTER — Encounter (HOSPITAL_COMMUNITY): Payer: Self-pay | Admitting: *Deleted

## 2018-05-15 ENCOUNTER — Emergency Department (HOSPITAL_COMMUNITY)
Admission: EM | Admit: 2018-05-15 | Discharge: 2018-05-15 | Disposition: A | Discharge status: Home / Self Care | Payer: Self-pay | Attending: Emergency Medicine | Admitting: Emergency Medicine

## 2018-05-15 ENCOUNTER — Encounter (HOSPITAL_COMMUNITY)
Admission: AD | Disposition: A | Discharge status: Home / Self Care | Payer: Self-pay | Source: Ambulatory Visit | Attending: General Surgery

## 2018-05-15 ENCOUNTER — Encounter (HOSPITAL_COMMUNITY): Payer: Self-pay

## 2018-05-15 ENCOUNTER — Emergency Department (HOSPITAL_COMMUNITY): Payer: Self-pay

## 2018-05-15 ENCOUNTER — Ambulatory Visit (HOSPITAL_COMMUNITY): Payer: Medicaid Other | Admitting: Anesthesiology

## 2018-05-15 ENCOUNTER — Ambulatory Visit (HOSPITAL_COMMUNITY)
Admission: AD | Admit: 2018-05-15 | Discharge: 2018-05-15 | Disposition: A | Discharge status: Home / Self Care | Payer: Medicaid Other | Source: Ambulatory Visit | Attending: General Surgery | Admitting: General Surgery

## 2018-05-15 DIAGNOSIS — S66291A Other specified injury of extensor muscle, fascia and tendon of right thumb at wrist and hand level, initial encounter: Secondary | ICD-10-CM

## 2018-05-15 DIAGNOSIS — Y9389 Activity, other specified: Secondary | ICD-10-CM | POA: Insufficient documentation

## 2018-05-15 DIAGNOSIS — W458XXA Other foreign body or object entering through skin, initial encounter: Secondary | ICD-10-CM | POA: Insufficient documentation

## 2018-05-15 DIAGNOSIS — R531 Weakness: Secondary | ICD-10-CM | POA: Insufficient documentation

## 2018-05-15 DIAGNOSIS — S61511A Laceration without foreign body of right wrist, initial encounter: Secondary | ICD-10-CM

## 2018-05-15 DIAGNOSIS — S66221A Laceration of extensor muscle, fascia and tendon of right thumb at wrist and hand level, initial encounter: Secondary | ICD-10-CM | POA: Insufficient documentation

## 2018-05-15 DIAGNOSIS — Y998 Other external cause status: Secondary | ICD-10-CM | POA: Insufficient documentation

## 2018-05-15 DIAGNOSIS — Z79899 Other long term (current) drug therapy: Secondary | ICD-10-CM | POA: Insufficient documentation

## 2018-05-15 DIAGNOSIS — Y929 Unspecified place or not applicable: Secondary | ICD-10-CM | POA: Insufficient documentation

## 2018-05-15 DIAGNOSIS — W269XXA Contact with unspecified sharp object(s), initial encounter: Secondary | ICD-10-CM | POA: Insufficient documentation

## 2018-05-15 HISTORY — PX: TENDON REPAIR: SHX5111

## 2018-05-15 HISTORY — DX: Anxiety disorder, unspecified: F41.9

## 2018-05-15 HISTORY — DX: Polycystic ovarian syndrome: E28.2

## 2018-05-15 LAB — POCT PREGNANCY, URINE: PREG TEST UR: NEGATIVE

## 2018-05-15 SURGERY — TENDON REPAIR
Anesthesia: General | Site: Wrist | Laterality: Right

## 2018-05-15 MED ORDER — HYDROCODONE-ACETAMINOPHEN 5-325 MG PO TABS: 1.0000 | ORAL_TABLET | ORAL | 0 refills | Status: DC | PRN

## 2018-05-15 MED ORDER — MIDAZOLAM HCL 2 MG/2ML IJ SOLN
INTRAMUSCULAR | Status: AC
  Filled 2018-05-15: qty 2

## 2018-05-15 MED ORDER — HYDROCODONE-ACETAMINOPHEN 5-325 MG PO TABS
1.0000 | ORAL_TABLET | Freq: Once | ORAL | Status: AC
  Administered 2018-05-15: 1 via ORAL
  Filled 2018-05-15: qty 1

## 2018-05-15 MED ORDER — OXYCODONE HCL 5 MG/5ML PO SOLN: 5.0000 mg | Freq: Once | ORAL | Status: DC | PRN

## 2018-05-15 MED ORDER — LACTATED RINGERS IV SOLN
INTRAVENOUS | Status: DC
  Administered 2018-05-15: 13:00:00 via INTRAVENOUS

## 2018-05-15 MED ORDER — OXYCODONE HCL 5 MG PO TABS: 5.0000 mg | ORAL_TABLET | Freq: Once | ORAL | Status: DC | PRN

## 2018-05-15 MED ORDER — CLINDAMYCIN PHOSPHATE 600 MG/50ML IV SOLN
600.0000 mg | Freq: Once | INTRAVENOUS | Status: AC
  Administered 2018-05-15: 600 mg via INTRAVENOUS
  Filled 2018-05-15: qty 50

## 2018-05-15 MED ORDER — PROMETHAZINE HCL 25 MG/ML IJ SOLN
6.2500 mg | INTRAMUSCULAR | Status: DC | PRN
  Administered 2018-05-15: 6.25 mg via INTRAVENOUS

## 2018-05-15 MED ORDER — PROPOFOL 10 MG/ML IV BOLUS
INTRAVENOUS | Status: AC
  Filled 2018-05-15: qty 20

## 2018-05-15 MED ORDER — FENTANYL CITRATE (PF) 250 MCG/5ML IJ SOLN
INTRAMUSCULAR | Status: DC | PRN
  Administered 2018-05-15 (×2): 50 ug via INTRAVENOUS

## 2018-05-15 MED ORDER — PROMETHAZINE HCL 25 MG/ML IJ SOLN
INTRAMUSCULAR | Status: AC
  Filled 2018-05-15: qty 1

## 2018-05-15 MED ORDER — BUPIVACAINE HCL (PF) 0.25 % IJ SOLN
INTRAMUSCULAR | Status: AC
  Filled 2018-05-15: qty 30

## 2018-05-15 MED ORDER — FENTANYL CITRATE (PF) 100 MCG/2ML IJ SOLN
25.0000 ug | INTRAMUSCULAR | Status: DC | PRN
  Administered 2018-05-15: 50 ug via INTRAVENOUS

## 2018-05-15 MED ORDER — LIDOCAINE-EPINEPHRINE (PF) 1 %-1:200000 IJ SOLN
30.0000 mL | Freq: Once | INTRAMUSCULAR | Status: AC
  Administered 2018-05-15: 30 mL
  Filled 2018-05-15: qty 30

## 2018-05-15 MED ORDER — 0.9 % SODIUM CHLORIDE (POUR BTL) OPTIME
TOPICAL | Status: DC | PRN
  Administered 2018-05-15: 1000 mL

## 2018-05-15 MED ORDER — ACETAMINOPHEN 10 MG/ML IV SOLN: 1000.0000 mg | Freq: Once | INTRAVENOUS | Status: DC | PRN

## 2018-05-15 MED ORDER — PROPOFOL 10 MG/ML IV BOLUS
INTRAVENOUS | Status: DC | PRN
  Administered 2018-05-15: 200 mg via INTRAVENOUS

## 2018-05-15 MED ORDER — ONDANSETRON HCL 4 MG/2ML IJ SOLN
INTRAMUSCULAR | Status: DC | PRN
  Administered 2018-05-15: 4 mg via INTRAVENOUS

## 2018-05-15 MED ORDER — MIDAZOLAM HCL 5 MG/5ML IJ SOLN
INTRAMUSCULAR | Status: DC | PRN
  Administered 2018-05-15: 2 mg via INTRAVENOUS

## 2018-05-15 MED ORDER — DEXAMETHASONE SODIUM PHOSPHATE 10 MG/ML IJ SOLN
INTRAMUSCULAR | Status: DC | PRN
  Administered 2018-05-15: 10 mg via INTRAVENOUS

## 2018-05-15 MED ORDER — BUPIVACAINE HCL (PF) 0.25 % IJ SOLN
INTRAMUSCULAR | Status: DC | PRN
  Administered 2018-05-15: 15 mL

## 2018-05-15 MED ORDER — FENTANYL CITRATE (PF) 100 MCG/2ML IJ SOLN
INTRAMUSCULAR | Status: AC
  Filled 2018-05-15: qty 2

## 2018-05-15 MED ORDER — FENTANYL CITRATE (PF) 250 MCG/5ML IJ SOLN
INTRAMUSCULAR | Status: AC
  Filled 2018-05-15: qty 5

## 2018-05-15 MED ORDER — LIDOCAINE HCL (CARDIAC) PF 100 MG/5ML IV SOSY
PREFILLED_SYRINGE | INTRAVENOUS | Status: DC | PRN
  Administered 2018-05-15: 60 mg via INTRAVENOUS

## 2018-05-15 SURGICAL SUPPLY — 54 items
BANDAGE ACE 3X5.8 VEL STRL LF (GAUZE/BANDAGES/DRESSINGS) IMPLANT
BANDAGE ACE 4X5 VEL STRL LF (GAUZE/BANDAGES/DRESSINGS) IMPLANT
BNDG CMPR 9X4 STRL LF SNTH (GAUZE/BANDAGES/DRESSINGS) ×1
BNDG COHESIVE 1X5 TAN STRL LF (GAUZE/BANDAGES/DRESSINGS) IMPLANT
BNDG CONFORM 2 STRL LF (GAUZE/BANDAGES/DRESSINGS) ×2 IMPLANT
BNDG ELASTIC 2X5.8 VLCR STR LF (GAUZE/BANDAGES/DRESSINGS) ×2 IMPLANT
BNDG ESMARK 4X9 LF (GAUZE/BANDAGES/DRESSINGS) ×2 IMPLANT
BNDG GAUZE ELAST 4 BULKY (GAUZE/BANDAGES/DRESSINGS) IMPLANT
CORD BIPOLAR FORCEPS 12FT (ELECTRODE) ×2 IMPLANT
COVER SURGICAL LIGHT HANDLE (MISCELLANEOUS) ×2 IMPLANT
COVER WAND RF STERILE (DRAPES) ×2 IMPLANT
CUFF TOURNIQUET SINGLE 18IN (TOURNIQUET CUFF) IMPLANT
CUFF TOURNIQUET SINGLE 24IN (TOURNIQUET CUFF) ×2 IMPLANT
DECANTER SPIKE VIAL GLASS SM (MISCELLANEOUS) ×2 IMPLANT
DRAPE OEC MINIVIEW 54X84 (DRAPES) IMPLANT
DRAPE SURG 17X23 STRL (DRAPES) ×2 IMPLANT
DRSG XEROFORM 1X8 (GAUZE/BANDAGES/DRESSINGS) ×2 IMPLANT
GAUZE SPONGE 2X2 8PLY STRL LF (GAUZE/BANDAGES/DRESSINGS) IMPLANT
GAUZE SPONGE 4X4 12PLY STRL (GAUZE/BANDAGES/DRESSINGS) ×2 IMPLANT
GAUZE XEROFORM 1X8 LF (GAUZE/BANDAGES/DRESSINGS) IMPLANT
GLOVE BIOGEL M 8.0 STRL (GLOVE) ×2 IMPLANT
GOWN STRL REUS W/ TWL LRG LVL3 (GOWN DISPOSABLE) ×2 IMPLANT
GOWN STRL REUS W/ TWL XL LVL3 (GOWN DISPOSABLE) ×1 IMPLANT
GOWN STRL REUS W/TWL LRG LVL3 (GOWN DISPOSABLE) ×4
GOWN STRL REUS W/TWL XL LVL3 (GOWN DISPOSABLE) ×2
KIT BASIN OR (CUSTOM PROCEDURE TRAY) ×2 IMPLANT
KIT TURNOVER KIT B (KITS) ×2 IMPLANT
MANIFOLD NEPTUNE II (INSTRUMENTS) ×2 IMPLANT
NEEDLE HYPO 25GX1X1/2 BEV (NEEDLE) ×2 IMPLANT
NS IRRIG 1000ML POUR BTL (IV SOLUTION) ×2 IMPLANT
PACK ORTHO EXTREMITY (CUSTOM PROCEDURE TRAY) ×2 IMPLANT
PAD ARMBOARD 7.5X6 YLW CONV (MISCELLANEOUS) ×4 IMPLANT
PAD CAST 4YDX4 CTTN HI CHSV (CAST SUPPLIES) IMPLANT
PADDING CAST COTTON 4X4 STRL (CAST SUPPLIES)
PADDING UNDERCAST 2 STRL (CAST SUPPLIES) ×1
PADDING UNDERCAST 2X4 STRL (CAST SUPPLIES) ×1 IMPLANT
SOAP 2 % CHG 4 OZ (WOUND CARE) ×2 IMPLANT
SPECIMEN JAR SMALL (MISCELLANEOUS) ×2 IMPLANT
SPLINT FIBERGLASS 3X12 (CAST SUPPLIES) ×2 IMPLANT
SPONGE GAUZE 2X2 STER 10/PKG (GAUZE/BANDAGES/DRESSINGS)
SUCTION FRAZIER HANDLE 10FR (MISCELLANEOUS)
SUCTION TUBE FRAZIER 10FR DISP (MISCELLANEOUS) IMPLANT
SUT FIBERWIRE 4-0 18 TAPR NDL (SUTURE) ×4
SUT MERSILENE 4 0 P 3 (SUTURE) IMPLANT
SUT PROLENE 4 0 PS 2 18 (SUTURE) IMPLANT
SUT VIC AB 2-0 CT1 27 (SUTURE)
SUT VIC AB 2-0 CT1 TAPERPNT 27 (SUTURE) IMPLANT
SUTURE FIBERWR 4-0 18 TAPR NDL (SUTURE) ×2 IMPLANT
SYR CONTROL 10ML LL (SYRINGE) ×2 IMPLANT
TOWEL OR 17X24 6PK STRL BLUE (TOWEL DISPOSABLE) ×2 IMPLANT
TOWEL OR 17X26 10 PK STRL BLUE (TOWEL DISPOSABLE) ×2 IMPLANT
TUBE CONNECTING 12X1/4 (SUCTIONS) ×2 IMPLANT
UNDERPAD 30X30 (UNDERPADS AND DIAPERS) ×2 IMPLANT
WATER STERILE IRR 1000ML POUR (IV SOLUTION) ×2 IMPLANT

## 2018-05-15 NOTE — Anesthesia Postprocedure Evaluation (Signed)
Anesthesia Post Note  Patient: Hannah Walker  Procedure(s) Performed: EXTENSOR TENDON REPAIR RIGHT THUMB (Right Wrist)     Patient location during evaluation: PACU Anesthesia Type: General Level of consciousness: awake and alert Pain management: pain level controlled Vital Signs Assessment: post-procedure vital signs reviewed and stable Respiratory status: spontaneous breathing, nonlabored ventilation, respiratory function stable and patient connected to nasal cannula oxygen Cardiovascular status: blood pressure returned to baseline and stable Postop Assessment: no apparent nausea or vomiting Anesthetic complications: no    Last Vitals:  Vitals:   05/15/18 1637 05/15/18 1652  BP: 107/73 110/76  Pulse: 75 85  Resp: 10 12  Temp:    SpO2: 97% 98%    Last Pain:  Vitals:   05/15/18 1637  TempSrc:   PainSc: Asleep                 Ryan P Ellender

## 2018-05-15 NOTE — H&P (Signed)
Reason for Consult:hand laceration Referring Physician: ER  CC:I cut my hand  HPI:  Hannah Walker is an 18 y.o. right handed female who presents with laceration to R wrist from plate this am.  Pt unable to extend distal thumb.        Pain is rated at    7/10 and is described as sharp.  Pain is constant.  Pain is made better by rest/immobilization, worse with motion.   Associated signs/symptoms: inability to extend thumb Previous treatment:  n/a  Past Medical History:  Diagnosis Date  . Anxiety   . Headache   . PCO (polycystic ovaries)   . Renal disorder    kidney stones    Past Surgical History:  Procedure Laterality Date  . NO PAST SURGERIES      Family History  Problem Relation Age of Onset  . Healthy Mother   . Healthy Father     Social History:  reports that she has never smoked. She has never used smokeless tobacco. She reports that she does not drink alcohol or use drugs.  Allergies:  Allergies  Allergen Reactions  . Ampicillin Rash    Has patient had a PCN reaction causing immediate rash, facial/tongue/throat swelling, SOB or lightheadedness with hypotension: Yes Has patient had a PCN reaction causing severe rash involving mucus membranes or skin necrosis: No Has patient had a PCN reaction that required hospitalization: No Has patient had a PCN reaction occurring within the last 10 years: Yes If all of the above answers are "NO", then may proceed with Cephalosporin use.     Medications: I have reviewed the patient's current medications.  Results for orders placed or performed during the hospital encounter of 05/15/18 (from the past 48 hour(s))  Pregnancy, urine POC     Status: None   Collection Time: 05/15/18 12:08 PM  Result Value Ref Range   Preg Test, Ur NEGATIVE NEGATIVE    Comment:        THE SENSITIVITY OF THIS METHODOLOGY IS >24 mIU/mL     Dg Hand Complete Right  Result Date: 05/15/2018 CLINICAL DATA:  Hand laceration. EXAM: RIGHT HAND -  COMPLETE 3+ VIEW COMPARISON:  None. FINDINGS: Artifact from overlying bandage. No fracture or opaque foreign body. No dislocation. IMPRESSION: No osseous abnormality or opaque foreign body. Electronically Signed   By: Marnee Spring M.D.   On: 05/15/2018 06:19    Pertinent items are noted in HPI. Temp:  [98.5 F (36.9 C)-98.7 F (37.1 C)] 98.5 F (36.9 C) (02/14 1215) Pulse Rate:  [85-99] 85 (02/14 1215) Resp:  [16-18] 18 (02/14 1215) BP: (111-121)/(73-84) 111/73 (02/14 1215) SpO2:  [99 %-100 %] 100 % (02/14 1215) Weight:  [66.2 kg-66.7 kg] 66.2 kg (02/14 1215) General appearance: alert and cooperative Resp: clear to auscultation bilaterally Cardio: regular rate and rhythm GI: soft, non-tender; bowel sounds normal; no masses,  no organomegaly Extremities: extremities normal, atraumatic, no cyanosis or edema Except for 3cm laceration at base of thumb, inability to extend thumb  Assessment:laceration R thumb with probable extensor tendon injury Plan: Explore wound tendon repair as needed. I have discussed this treatment plan in detail with patient and family, including the risks of the recommended treatment or surgery, the benefits and the alternatives.  The patient and caregiver understands that additional treatment may be necessary.  Johnette Abraham 05/15/2018, 12:33 PM

## 2018-05-15 NOTE — Transfer of Care (Signed)
Immediate Anesthesia Transfer of Care Note  Patient: Hannah Walker Born  Procedure(s) Performed: EXTENSOR TENDON REPAIR RIGHT THUMB (Right Wrist)  Patient Location: PACU  Anesthesia Type:General  Level of Consciousness: awake, alert , oriented and patient cooperative  Airway & Oxygen Therapy: Patient Spontanous Breathing  Post-op Assessment: Report given to RN and Post -op Vital signs reviewed and stable  Post vital signs: Reviewed and stable  Last Vitals:  Vitals Value Taken Time  BP 127/92 05/15/2018  3:37 PM  Temp 36.4 C 05/15/2018  3:35 PM  Pulse 107 05/15/2018  3:37 PM  Resp 18 05/15/2018  3:37 PM  SpO2 100 % 05/15/2018  3:37 PM  Vitals shown include unvalidated device data.  Last Pain:  Vitals:   05/15/18 1215  TempSrc: Oral         Complications: No apparent anesthesia complications

## 2018-05-15 NOTE — Op Note (Signed)
NAME: Hannah Walker, PLAUTZ MEDICAL RECORD ZO:[id number] ACCOUNT [id number] DATE OF BIRTH:[birth year] FACILITY: [facility] LOCATION: [facility] PHYSICIAN:Marilyn Wing C. Shakisha Abend, MD  OPERATIVE REPORT  DATE OF PROCEDURE:  05/15/2018  PREOPERATIVE DIAGNOSIS:  Laceration to the dorsal right hand with presumed extensor tendon injury.  POSTOPERATIVE DIAGNOSIS:  Laceration to the dorsal right hand with presumed extensor tendon injury.  PROCEDURE: 1.  Exploration of wound of the right hand. 2.  Repair of the extensor pollicis longus tendon. 3.  Repair of the extensor carpi radialis longus. 4.  Repair of the extensor carpi radialis brevis.  INDICATIONS:  The patient is a 18 year old female who lacerated her dorsal hand on some kind of plate this morning.  She was seen and had inability to extend her thumb as well as weak wrist extension, presumed tendon injury was suspected.  Risks,  benefits and alternatives of surgical exploration and repair were discussed with her and her family.  They agree with these risks.  Consent was obtained.  DESCRIPTION OF PROCEDURE:  The patient was taken to the operating room and placed supine on the operating table.  Anesthesia was administered without difficulty.  The temporary sutures were removed from the wound.  The wound was opened and explored after  it was prepped and draped in normal sterile fashion.  Initially, it was felt that only the extensor pollicis longus tendon was lacerated.  After exploration, it was evident that the wound and damage was more extensive with complete lacerations of the  ECRL and ECRB as well as the EPL.  The wound had to be opened a couple of centimeters proximally to gain access to the ends of the tendons.  The ECRL and ECRB were repaired first with a modified Kessler with 4-0 FiberWire creating a nice compact and  strong repair.  The EPL tendon was then grasped and also repaired in a modified Kessler type fashion.  Afterwards, gentle range of motion  of the thumb and wrist did not show any gaping at the tendon repair site.  The tourniquet was released.  Hemostasis  was obtained.  The wound was closed with interrupted 4-0 nylon sutures.  A sterile dressing and splint with the thumb in abduction and extension as well as the wrist in extension was performed.  The patient tolerated the procedure well and was taken to  recovery room stable.  TN/NUANCE  D:05/15/2018 T:05/15/2018 JOB:[id number]

## 2018-05-15 NOTE — Discharge Instructions (Signed)
As we discussed, we suspect you have a tendon injury to your thumb.  Call Dr. Debby Bud office today at 9:00 to be seen today.  Do not eat or drink until you see him.  Your sutures need to be removed in 1 week but he may put a new sutures today after evaluating your wound.  Return to the ED with worsening pain, weakness, numbness, bleeding, drainage or other concerns.

## 2018-05-15 NOTE — Progress Notes (Signed)
Loraine Leriche, Consulting civil engineer administered Fentanyl to patient while CJ, RN was at lunch.  CJ, RN and Ladona Ridgel, RN wasted Fentanyl in Pyxis stericycle in PACU.  Patient has been discharged home.

## 2018-05-15 NOTE — Discharge Instructions (Signed)
Discharge Instructions:  Keep your dressing clean, dry and in place until instructed to remove by Dr. Coley.  If the dressing becomes dirty or wet call the office for instructions during business hours. Elevate the extremity to help with swelling, this will also help with any discomfort. Take your medication as prescribed. No lifting with the injured  extremity. If you feel that the dressing is too tight, you may loosen it, but keep it on; finger tips should be pink; if there is a concern, call the office. [phone] Ice may be used if the injury is a fracture, do not apply ice directly to the skin. Please call the office on the next business day after discharge to arrange a follow up appointment.  Call [phone] between the hours of 9am - 5pm M-Th or 9am - 1pm on Fri. For most hand injuries and/or conditions, you may return to work using the uninjured hand (one handed duty) within 24-72 hours.  A detailed note will be provided to you at your follow up appointment or may contact the office prior to your follow up.   Post Anesthesia Home Care Instructions  Activity: Get plenty of rest for the remainder of the day. A responsible individual must stay with you for 24 hours following the procedure.  For the next 24 hours, DO NOT: -Drive a car -Operate machinery -Drink alcoholic beverages -Take any medication unless instructed by your physician -Make any legal decisions or sign important papers.  Meals: Start with liquid foods such as gelatin or soup. Progress to regular foods as tolerated. Avoid greasy, spicy, heavy foods. If nausea and/or vomiting occur, drink only clear liquids until the nausea and/or vomiting subsides. Call your physician if vomiting continues.  Special Instructions/Symptoms: Your throat may feel dry or sore from the anesthesia or the breathing tube placed in your throat during surgery. If this causes discomfort, gargle with warm salt water. The discomfort should  disappear within 24 hours.  If you had a scopolamine patch placed behind your ear for the management of post- operative nausea and/or vomiting:  1. The medication in the patch is effective for 72 hours, after which it should be removed.  Wrap patch in a tissue and discard in the trash. Wash hands thoroughly with soap and water. 2. You may remove the patch earlier than 72 hours if you experience unpleasant side effects which may include dry mouth, dizziness or visual disturbances. 3. Avoid touching the patch. Wash your hands with soap and water after contact with the patch.        

## 2018-05-15 NOTE — Final Progress Note (Signed)
Lab work not needed per [employer]

## 2018-05-15 NOTE — Anesthesia Procedure Notes (Signed)
Procedure Name: LMA Insertion Date/Time: 05/15/2018 1:57 PM Performed by: Adonis Housekeeper, CRNA Pre-anesthesia Checklist: Patient identified, Emergency Drugs available, Suction available and Patient being monitored Patient Re-evaluated:Patient Re-evaluated prior to induction Oxygen Delivery Method: Circle system utilized Preoxygenation: Pre-oxygenation with 100% oxygen Induction Type: IV induction Ventilation: Mask ventilation without difficulty LMA: LMA inserted LMA Size: 4.0 Number of attempts: 1 Placement Confirmation: positive ETCO2 and breath sounds checked- equal and bilateral Tube secured with: Tape Dental Injury: Teeth and Oropharynx as per pre-operative assessment

## 2018-05-15 NOTE — ED Provider Notes (Signed)
[facility]ERGENCY DEPARTMENT Provider Note   CSN: [id number] Arrival date & time: 05/15/18  0530     History   Chief Complaint Chief Complaint  Patient presents with  . Laceration    HPI Hannah Walker is a 18 y.o. female.  Patient sustained laceration to her right dorsal hand and wrist from a ceramic plate that shattered.  States she bumped a plate and it fell and shattered lacerating her right dorsal wrist.  Bleeding is controlled.  She has some weakness in thumb extension.  There is no numbness or tingling.  She believes her tetanus shot is up-to-date.  No other injuries.  Denies any intentional self-harm.  The history is provided by the patient.  Laceration  Associated symptoms: no fever     Past Medical History:  Diagnosis Date  . Headache   . Renal disorder    kidney stones    Patient Active Problem List   Diagnosis Date Noted  . Episodic tension-type headache, not intractable 01/07/2018  . Migraine without aura and without status migrainosus, not intractable 07/23/2017  . New daily persistent headache 02/27/2017  . Concussion without loss of consciousness 02/27/2017    Past Surgical History:  Procedure Laterality Date  . NO PAST SURGERIES       OB History   No obstetric history on file.      Home Medications    Prior to Admission medications   Medication Sig Start Date End Date Taking? Authorizing Provider  cetirizine (ZYRTEC) 10 MG tablet Take 1 tablet (10 mg total) by mouth daily. 10/27/17  Yes Yu, Amy V, PA-C  ESTARYLLA 0.25-35 MG-MCG tablet TK 1 T PO D 04/08/18  Yes [provider]  ondansetron (ZOFRAN) 4 MG tablet Take 1 tablet as needed for nausea, no more than once daily 10/22/17  Yes Hickling, Deanna ArtisWilliam H, MD  rizatriptan (MAXALT-MLT) 10 MG disintegrating tablet Take 1 tablet under your tongue with a nonsteroidal medication and onset of migraine may repeat an additional tablet in 2 hours if needed 04/16/18  Yes Hickling, Deanna ArtisWilliam H, MD    tiZANidine (ZANAFLEX) 4 MG tablet TAKE 1 TABLET BY MOUTH AT BEDTIME AS NEEDED FOR SEVERE PAIN 10/22/17  Yes Deetta PerlaHickling, William H, MD  benzonatate (TESSALON) 100 MG capsule Take 1-2 capsules (100-200 mg total) by mouth 3 (three) times daily as needed for cough. 02/15/18   Elvina SidleLauenstein, Kurt, MD  QUDEXY XR CS24 sprinkle capsule Take 1 tablet at nighttime. Patient not taking: Reported on 04/16/2018 01/07/18   Deetta PerlaHickling, William H, MD  TRI-SPRINTEC 0.18/0.215/0.25 MG-35 MCG tablet TK 1 T PO D 03/27/17   [provider]    Family History Family History  Problem Relation Age of Onset  . Healthy Mother   . Healthy Father     Social History Social History   Tobacco Use  . Smoking status: Never Smoker  . Smokeless tobacco: Never Used  Substance Use Topics  . Alcohol use: No  . Drug use: No     Allergies   Ampicillin   Review of Systems Review of Systems  Constitutional: Negative for activity change, appetite change and fever.  HENT: Negative for congestion.   Respiratory: Negative for cough, chest tightness and shortness of breath.   Cardiovascular: Negative for chest pain.  Gastrointestinal: Negative for abdominal pain, nausea and vomiting.  Genitourinary: Negative for dysuria, vaginal bleeding and vaginal discharge.  Musculoskeletal: Negative for arthralgias and myalgias.  Skin: Positive for wound.  Neurological: Positive for weakness. Negative  for light-headedness and headaches.    all other systems are negative except as noted in the HPI and PMH.    Physical Exam Updated Vital Signs BP 117/84 (BP Location: Left Arm)   Pulse 98   Temp 98.7 F (37.1 C) (Oral)   Resp 17   Ht 5\' 4"  (1.626 m)   Wt 66.7 kg   LMP 05/14/2018   SpO2 100%   BMI 25.23 kg/m   Physical Exam Vitals signs and nursing note reviewed.  Constitutional:      General: She is not in acute distress.    Appearance: She is well-developed.     Comments: Tearful and anxious  HENT:     Head:  Normocephalic and atraumatic.     Mouth/Throat:     Mouth: Mucous membranes are moist.     Pharynx: No oropharyngeal exudate.  Eyes:     Conjunctiva/sclera: Conjunctivae normal.     Pupils: Pupils are equal, round, and reactive to light.  Neck:     Musculoskeletal: Normal range of motion and neck supple.     Comments: No meningismus. Cardiovascular:     Rate and Rhythm: Normal rate and regular rhythm.     Heart sounds: Normal heart sounds. No murmur.  Pulmonary:     Effort: Pulmonary effort is normal. No respiratory distress.     Breath sounds: Normal breath sounds.  Abdominal:     Palpations: Abdomen is soft.     Tenderness: There is no abdominal tenderness. There is no guarding or rebound.  Musculoskeletal: Normal range of motion.        General: Tenderness and signs of injury present.     Comments: There is a 3 cm laceration across right dorsal wrist.  Bleeding is controlled. No visible foreign body. Wrist flexion and extension are intact.  There is weakness on thumb extension.  Patient is able to oppose thumb and flex at IP joint as well as flex thumb into the palm.  She states she cannot extend thumb at MCP or IP joint.  On encouragement, patient does have some resistance of extension of her thumb MCP joint but still has weakness with extension of the IP joint  Skin:    General: Skin is warm.     Capillary Refill: Capillary refill takes less than 2 seconds.  Neurological:     Mental Status: She is alert and oriented to person, place, and time. Mental status is at baseline.     Cranial Nerves: No cranial nerve deficit.     Motor: No abnormal muscle tone.     Coordination: Coordination normal.     Comments:  5/5 strength throughout. CN 2-12 intact.Equal grip strength.   Psychiatric:        Behavior: Behavior normal.        ED Treatments / Results  Labs (all labs ordered are listed, but only abnormal results are displayed) Labs Reviewed - No data to  display  EKG None  Radiology Dg Hand Complete Right  Result Date: 05/15/2018 CLINICAL DATA:  Hand laceration. EXAM: RIGHT HAND - COMPLETE 3+ VIEW COMPARISON:  None. FINDINGS: Artifact from overlying bandage. No fracture or opaque foreign body. No dislocation. IMPRESSION: No osseous abnormality or opaque foreign body. Electronically Signed   By: Marnee Spring M.D.   On: 05/15/2018 06:19    Procedures .Marland KitchenLaceration Repair Date/Time: 05/15/2018 7:11 AM Performed by: Glynn Octave, MD Authorized by: Glynn Octave, MD   Consent:    Consent obtained:  Emergent situation  and verbal   Consent given by:  Patient   Risks discussed:  Infection, pain, poor cosmetic result, poor wound healing, need for additional repair, nerve damage, retained foreign body, tendon damage and vascular damage   Alternatives discussed:  No treatment Anesthesia (see MAR for exact dosages):    Anesthesia method:  Local infiltration   Local anesthetic:  Lidocaine 1% WITH epi Laceration details:    Location:  Hand   Hand location:  R wrist   Length (cm):  3 Repair type:    Repair type:  Complex Pre-procedure details:    Preparation:  Patient was prepped and draped in usual sterile fashion and imaging obtained to evaluate for foreign bodies Exploration:    Hemostasis achieved with:  Epinephrine and direct pressure   Wound exploration: wound explored through full range of motion and entire depth of wound probed and visualized     Wound extent: fascia violated, muscle damage, nerve damage and tendon damage     Wound extent: no foreign bodies/material noted     Tendon damage location:  Upper extremity   Upper extremity tendon damage location:  Finger extensor   Finger extensor tendon:  Extensor pollicis longus   Tendon damage extent:  Complete transection   Tendon repair plan:  Refer for evaluation   Contaminated: no   Treatment:    Area cleansed with:  Betadine   Amount of cleaning:  Standard    Irrigation solution:  Sterile saline   Irrigation volume:  2 L   Irrigation method:  Pressure wash   Visualized foreign bodies/material removed: no     Scar revision: no   Skin repair:    Repair method:  Sutures   Suture size:  4-0   Suture material:  Nylon   Suture technique:  Simple interrupted   Number of sutures:  7 Approximation:    Approximation:  Close Post-procedure details:    Dressing:  Antibiotic ointment, adhesive bandage and splint for protection   Patient tolerance of procedure:  Tolerated well, no immediate complications   (including critical care time)  Medications Ordered in ED Medications  lidocaine-EPINEPHrine (XYLOCAINE-EPINEPHrine) 1 %-1:200000 (PF) injection 30 mL (has no administration in time range)  HYDROcodone-acetaminophen (NORCO/VICODIN) 5-325 MG per tablet 1 tablet (has no administration in time range)     Initial Impression / Assessment and Plan / ED Course  I have reviewed the triage vital signs and the nursing notes.  Pertinent labs & imaging results that were available during my care of the patient were reviewed by me and considered in my medical decision making (see chart for details).    Laceration with concern for thumb extensor injury.  Bleeding is controlled.  Tetanus is up-to-date.  Discussed with hand surgery Dr. Roda Shutters. he states he is not on-call for hand surgery even though he is listed for call at [facility] hand surgery.  Discussed with Dr. Izora Ribas of hand surgery.  He asks for wound to be closed loosely and he will see patient today in the office.  Recommends she call the office at 9 AM.  Remain n.p.o. until seen.  Patient wound repaired as above.  Emphasized the patient that she will need additional repair due to her extensor tendon injury.   Wound repaired as above.  Patient splinted.  She is in agreement to call Dr. Debby Bud office today at 9 AM for an appointment later today.  She understands her sutures will need to be removed in 7  days. Return precautions  discussed.   Final Clinical Impressions(s) / ED Diagnoses   Final diagnoses:  Laceration of right wrist, initial encounter  Other specified injury of extensor muscle, fascia and tendon of right thumb at wrist and hand level, initial encounter    ED Discharge Orders    None       Bryelle Spiewak, Jeannett Senior, MD 05/15/18 [phone]

## 2018-05-15 NOTE — ED Triage Notes (Signed)
Pt had a ceramic plate fall off of a shelf and hit her in the right wrist as the plate shattered.

## 2018-05-15 NOTE — Anesthesia Preprocedure Evaluation (Addendum)
Anesthesia Evaluation  Patient identified by MRN, date of birth, ID band Patient awake    Reviewed: Allergy & Precautions, NPO status , Patient's Chart, lab work & pertinent test results  History of Anesthesia Complications Negative for: history of anesthetic complications  Airway Mallampati: I  TM Distance: >3 FB Neck ROM: Full    Dental  (+) Teeth Intact, Dental Advisory Given   Pulmonary neg pulmonary ROS,    Pulmonary exam normal breath sounds clear to auscultation       Cardiovascular negative cardio ROS Normal cardiovascular exam Rhythm:Regular Rate:Normal     Neuro/Psych  Headaches, Anxiety    GI/Hepatic negative GI ROS, Neg liver ROS,   Endo/Other  negative endocrine ROSPCO (polycystic ovaries)  Renal/GU negative Renal ROS     Musculoskeletal negative musculoskeletal ROS (+)   Abdominal   Peds  Hematology negative hematology ROS (+)   Anesthesia Other Findings Laceration  Reproductive/Obstetrics hcg negative                            Anesthesia Physical Anesthesia Plan  ASA: II and emergent  Anesthesia Plan: General   Post-op Pain Management:    Induction: Intravenous  PONV Risk Score and Plan: 3 and Ondansetron, Dexamethasone, Midazolam and Treatment may vary due to age or medical condition  Airway Management Planned: LMA  Additional Equipment:   Intra-op Plan:   Post-operative Plan: Extubation in OR  Informed Consent: I have reviewed the patients History and Physical, chart, labs and discussed the procedure including the risks, benefits and alternatives for the proposed anesthesia with the patient or authorized representative who has indicated his/her understanding and acceptance.     Dental advisory given  Plan Discussed with: CRNA  Anesthesia Plan Comments:        Anesthesia Quick Evaluation

## 2018-05-16 ENCOUNTER — Encounter (HOSPITAL_COMMUNITY): Payer: Self-pay | Admitting: General Surgery

## 2018-06-04 ENCOUNTER — Ambulatory Visit (INDEPENDENT_AMBULATORY_CARE_PROVIDER_SITE_OTHER): Payer: Medicaid Other | Admitting: Family

## 2018-07-17 ENCOUNTER — Telehealth (INDEPENDENT_AMBULATORY_CARE_PROVIDER_SITE_OTHER): Payer: Self-pay | Admitting: Pediatrics

## 2018-07-17 ENCOUNTER — Ambulatory Visit (INDEPENDENT_AMBULATORY_CARE_PROVIDER_SITE_OTHER): Payer: Medicaid Other | Admitting: Pediatrics

## 2018-07-17 NOTE — Telephone Encounter (Signed)
Patient apparently quit her job in January has a new job, but does not yet have full insurance.  In a freak accident she severed some tendons in her arm in February and she is gradually recovering, but did this without insurance and so is in a very difficult financial bind.  I told her when she got to a point where she felt she could come that she should make an appoint with Inetta Fermo who would discuss Aimovig with her.  I asked her to schedule on a day when I was available.

## 2018-07-23 ENCOUNTER — Encounter (INDEPENDENT_AMBULATORY_CARE_PROVIDER_SITE_OTHER): Payer: Self-pay | Admitting: Family

## 2018-07-23 ENCOUNTER — Other Ambulatory Visit: Payer: Self-pay

## 2018-07-23 ENCOUNTER — Ambulatory Visit (INDEPENDENT_AMBULATORY_CARE_PROVIDER_SITE_OTHER): Payer: Managed Care, Other (non HMO) | Admitting: Family

## 2018-07-23 VITALS — BP 104/68 | HR 80 | Ht 63.75 in | Wt 141.0 lb

## 2018-07-23 DIAGNOSIS — G43009 Migraine without aura, not intractable, without status migrainosus: Secondary | ICD-10-CM | POA: Diagnosis not present

## 2018-07-23 NOTE — Patient Instructions (Signed)
Thank you for coming in today.   Instructions for you until your next appointment are as follows: 1. Call your insurance to ask if they will cover Aimovig. We may need to do some work to get it approved, such as a prior authorization, but we can work on that once we know if it is on your insurance formulary 2. If they do not cover Aimovig, the other two options are Emgality and Ajovy, so ask if either of those would be covered.  3. If the Aimovig is covered by Rosann Auerbach, you can go online to Aimovig.com and they have a coupon card and as well as a patient assistance program, in the event that the medication is not on your insurance plan.  4. Once you find out about the medication, send me a MyChart message or call me to let me know. I will send in a prescription and then we can get you scheduled to come in for your first injection.

## 2018-07-23 NOTE — Progress Notes (Signed)
Patient: [name] MRN: [id number] Sex: female DOB: Oct 03, 2000  Provider: Elveria Risingina Goodpasture, NP Location of Care: [facility]  Note type: Routine return visit  History of Present Illness: Referral Source: Suzanna Obeyeleste Wallace, MD [facility] Chief Complaint: Headaches  [name] is a 18 y.o. girl with history of new daily persistent headaches that are migrainous with pounding pain, nausea, vomiting and dizziness. She was last seen by Dr Sharene SkeansHickling on April 16, 2018. She tried Qudexy XR for prevention but had breakthrough bleeding and stopped it. He has been reluctant to try her on Depakote as she is a young woman of childbearing age. He is also reluctant to try beta blockers because her blood pressure is borderline and could make her symptomatically low. Dr Sharene SkeansHickling recommended a trial of CGRP inhibitors such as Aimovig, and she is here today to learn more about that.   [name] has tried Eletriptan, Sumatriptan, Axert, Zomig, and Maxalt without good results. She continues to have severe daily headaches that can be debilitating at times.   [name] has been otherwise generally healthy since she was last seen. She has no other health concerns   today other than previously mentioned.  Review of Systems: Please see the HPI for neurologic and other pertinent review of systems. Otherwise, all other systems were reviewed and were negative.    Past Medical History:  Diagnosis Date  . Anxiety   . Headache   . PCO (polycystic ovaries)   . Renal disorder    kidney stones   Hospitalizations: No., Head Injury: No., Nervous System Infections: No., Immunizations up to date: Yes.   Past Medical History Comments: See HPI Copied from previous record:  Birth History 6 Lbs.7oz. infant born at 7936weeks gestational age to a 2568year old g 1p 710female. Gestation wascomplicated bypreterm labor Normal spontaneous vaginal delivery Nursery Course  wascomplicated bypremature lungs requiring surfactant Growth and Development wasrecalled asnormal  Surgical History Past Surgical History:  Procedure Laterality Date  . NO PAST SURGERIES    . TENDON REPAIR Right 05/15/2018   Procedure: EXTENSOR TENDON REPAIR RIGHT THUMB;  Surgeon: Knute Neuoley, Harrill, MD;  Location: [facility] OR;  Service: Plastics;  Laterality: Right;    Family History family history includes Healthy in her father and mother. Family History is otherwise negative for migraines, seizures, cognitive impairment, blindness, deafness, birth defects, chromosomal disorder, autism.  Social History Social History   Socioeconomic History  . Marital status: Single    Spouse name: Not on file  . Number of children: Not on file  . Years of education: Not on file  . Highest education level: Not on file  Occupational History  . Not on file  Social Needs  . Financial resource strain: Not on file  . Food insecurity:    Worry: Not on file    Inability: Not on file  . Transportation needs:    Medical: Not on file    Non-medical: Not on file  Tobacco Use  . Smoking status: Never Smoker  . Smokeless tobacco: Never Used  Substance and Sexual Activity  . Alcohol use: No  . Drug use: No  . Sexual activity: Not on file  Lifestyle  . Physical activity:    Days per week: Not on file    Minutes per session: Not on file  . Stress: Not on file  Relationships  . Social connections:    Talks on phone: Not on file    Gets together: Not  on file    Attends religious service: Not on file    Active member of club or organization: Not on file    Attends meetings of clubs or organizations: Not on file    Relationship status: Not on file  Other Topics Concern  . 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   She was home schooled.   She lives with her dad only. She has no siblings.   She enjoys playing with her dogs and being on her phone.    Allergies  Allergies  Allergen Reactions  . Ampicillin Rash    Has patient had a PCN reaction causing immediate rash, facial/tongue/throat swelling, SOB or lightheadedness with hypotension: Yes Has patient had a PCN reaction causing severe rash involving mucus membranes or skin necrosis: No Has patient had a PCN reaction that required hospitalization: No Has patient had a PCN reaction occurring within the last 10 years: Yes If all of the above answers are "NO", then may proceed with Cephalosporin use.     Physical Exam BP 104/68   Pulse 80   Ht 5' 3.75" (1.619 m)   Wt 141 lb (64 kg)   BMI 24.39 kg/m  General: Well developed, well nourished, seated, in no evident distress, sandy red hair, brown eyes, right handed. She reports having a headache at the time of the visit. Head: Head normocephalic and atraumatic.  Oropharynx benign. Neck: Supple with no carotid bruits Cardiovascular: Regular rate and rhythm, no murmurs Respiratory: Breath sounds clear to auscultation Musculoskeletal: No obvious deformities or scoliosis Skin: No rashes or neurocutaneous lesions  Neurologic Exam Mental Status: Awake and fully alert.  Oriented to place and time.  Recent and remote memory intact.  Attention span, concentration, and fund of knowledge appropriate.  Mood and affect appropriate. Cranial Nerves: Fundoscopic exam reveals sharp disc margins.  Pupils equal, briskly reactive to light.  Extraocular movements full without nystagmus.  Visual fields full to confrontation.  Hearing intact and symmetric to finger rub.  Facial sensation intact.  Face tongue, palate move normally and symmetrically.  Neck flexion and extension normal. Motor: Normal bulk and tone. Normal strength in all tested extremity muscles. Sensory: Intact to touch and temperature in all extremities.  Coordination: Rapid alternating movements normal in all extremities.  Finger-to-nose and heel-to shin performed accurately bilaterally.  Romberg  negative. Gait and Station: Arises from chair without difficulty.  Stance is normal. Gait demonstrates normal stride length and balance.   Able to heel, toe and tandem walk without difficulty.  Impression 1.  Migraine without aura 2.  Episodic tension headache 3.  Daily persistent headache  Recommendations for plan of care The patient's previous [facility] records were reviewed. Altie has neither had nor required imaging or lab studies since the last visit. She is a 18 year old girl with history of migraine and tension headaches as well as daily persistent headaches. She is interested in treatment with a CGRP inhibitor and I talked with her about these medications, reviewing risks and benefits as well as possible side effects. I explained that she would need to come into the office for her first injection and she agreed with that. Rayann will check to see what her insurance will cover and will let me know. I will then schedule her for an appointment to receive her first injection and learn how to administer it at home after that. Jerelene agreed to the plans made today.  The medication list  was reviewed and reconciled.  No changes were made in the prescribed medications today.  A complete medication list was provided to the patient.   Allergies as of 07/23/2018      Reactions   Ampicillin Rash   Has patient had a PCN reaction causing immediate rash, facial/tongue/throat swelling, SOB or lightheadedness with hypotension: Yes Has patient had a PCN reaction causing severe rash involving mucus membranes or skin necrosis: No Has patient had a PCN reaction that required hospitalization: No Has patient had a PCN reaction occurring within the last 10 years: Yes If all of the above answers are "NO", then may proceed with Cephalosporin use.      Medication List       Accurate as of July 23, 2018  2:33 PM. Always use your most recent med list.        benzonatate 100 MG capsule Commonly known as:   TESSALON Take 1-2 capsules (100-200 mg total) by mouth 3 (three) times daily as needed for cough.   cetirizine 10 MG tablet Commonly known as:  ZYRTEC Take 1 tablet (10 mg total) by mouth daily.   Estarylla 0.25-35 MG-MCG tablet Generic drug:  norgestimate-ethinyl estradiol TK 1 T PO D   HYDROcodone-acetaminophen 5-325 MG tablet Commonly known as:  NORCO/VICODIN Take 1 tablet by mouth every 4 (four) hours as needed for moderate pain.   ondansetron 4 MG tablet Commonly known as:  ZOFRAN Take 1 tablet as needed for nausea, no more than once daily   rizatriptan 10 MG disintegrating tablet Commonly known as:  MAXALT-MLT Take 1 tablet under your tongue with a nonsteroidal medication and onset of migraine may repeat an additional tablet in 2 hours if needed   tiZANidine 4 MG tablet Commonly known as:  ZANAFLEX TAKE 1 TABLET BY MOUTH AT BEDTIME AS NEEDED FOR SEVERE PAIN       Dr. Sharene Skeans was consulted and came in to see the patient.   Total time spent with the patient was 25 minutes, of which 50% or more was spent in counseling and coordination of care.   Elveria Rising NP-C

## 2018-07-24 ENCOUNTER — Encounter (INDEPENDENT_AMBULATORY_CARE_PROVIDER_SITE_OTHER): Payer: Self-pay | Admitting: Family

## 2018-08-13 ENCOUNTER — Ambulatory Visit (INDEPENDENT_AMBULATORY_CARE_PROVIDER_SITE_OTHER): Payer: Medicaid Other | Admitting: Pediatrics

## 2018-10-28 ENCOUNTER — Other Ambulatory Visit: Payer: Self-pay

## 2018-10-28 ENCOUNTER — Other Ambulatory Visit: Payer: Medicaid Other

## 2018-10-28 DIAGNOSIS — Z20822 Contact with and (suspected) exposure to covid-19: Secondary | ICD-10-CM

## 2018-10-30 LAB — NOVEL CORONAVIRUS, NAA: SARS-CoV-2, NAA: NOT DETECTED

## 2018-11-16 ENCOUNTER — Other Ambulatory Visit (INDEPENDENT_AMBULATORY_CARE_PROVIDER_SITE_OTHER): Payer: Self-pay | Admitting: Pediatrics

## 2018-11-16 DIAGNOSIS — G43009 Migraine without aura, not intractable, without status migrainosus: Secondary | ICD-10-CM

## 2019-02-18 ENCOUNTER — Ambulatory Visit
Admission: EM | Admit: 2019-02-18 | Discharge: 2019-02-18 | Disposition: A | Discharge status: Home / Self Care | Payer: Medicaid Other | Attending: Emergency Medicine | Admitting: Emergency Medicine

## 2019-02-18 ENCOUNTER — Other Ambulatory Visit: Payer: Self-pay

## 2019-02-18 DIAGNOSIS — Z20828 Contact with and (suspected) exposure to other viral communicable diseases: Secondary | ICD-10-CM | POA: Insufficient documentation

## 2019-02-18 DIAGNOSIS — J0100 Acute maxillary sinusitis, unspecified: Secondary | ICD-10-CM | POA: Insufficient documentation

## 2019-02-18 DIAGNOSIS — Z20822 Contact with and (suspected) exposure to covid-19: Secondary | ICD-10-CM

## 2019-02-18 DIAGNOSIS — R300 Dysuria: Secondary | ICD-10-CM | POA: Insufficient documentation

## 2019-02-18 DIAGNOSIS — R05 Cough: Secondary | ICD-10-CM | POA: Insufficient documentation

## 2019-02-18 DIAGNOSIS — R059 Cough, unspecified: Secondary | ICD-10-CM

## 2019-02-18 LAB — POCT URINALYSIS DIP (MANUAL ENTRY)
Bilirubin, UA: NEGATIVE
Blood, UA: NEGATIVE
Glucose, UA: NEGATIVE mg/dL
Ketones, POC UA: NEGATIVE mg/dL
Leukocytes, UA: NEGATIVE
Nitrite, UA: NEGATIVE
Protein Ur, POC: NEGATIVE mg/dL
Spec Grav, UA: 1.015 (ref 1.010–1.025)
Urobilinogen, UA: 0.2 E.U./dL
pH, UA: 6.5 (ref 5.0–8.0)

## 2019-02-18 LAB — POCT RAPID STREP A (OFFICE): Rapid Strep A Screen: NEGATIVE

## 2019-02-18 LAB — POCT URINE PREGNANCY: Preg Test, Ur: NEGATIVE

## 2019-02-18 MED ORDER — DOXYCYCLINE HYCLATE 100 MG PO CAPS: 100.0000 mg | ORAL_CAPSULE | Freq: Two times a day (BID) | ORAL | 0 refills | Status: DC

## 2019-02-18 MED ORDER — BENZONATATE 100 MG PO CAPS: 100.0000 mg | ORAL_CAPSULE | Freq: Three times a day (TID) | ORAL | 0 refills | Status: DC

## 2019-02-18 NOTE — ED Provider Notes (Signed)
[facility]   [id number] 02/18/19 Arrival Time: 1151   CC: URI symptoms; dysuria  SUBJECTIVE: History from: patient.  Hannah Walker is a 18 y.o. female who presents with sore throat, nasal congestion, sinus pain/ pressure x 1 week, and dry cough that began yesterday.  Denies sick exposure to COVID, flu or strep.  Denies recent travel.  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  Has tried OTC medications with minimal relief.  Symptoms are made worse with laying down.  Reports previous symptoms in the past related to sinuses and strep.     Patient also complains of burning with urination, and frequency x 4 days.  Denies precipitating event.  Denies aggravating or alleviating factors.  Admits to similar symptoms in the past associated with UTI.    Denies fever, chills, fatigue, sinus pain, rhinorrhea, sore throat, SOB, wheezing, chest pain, nausea, vomiting, abdominal pain, changes in bowel habits.    ROS: As per HPI.  All other pertinent ROS negative.     Past Medical History:  Diagnosis Date  . Anxiety   . Headache   . PCO (polycystic ovaries)   . Renal disorder    kidney stones   Past Surgical History:  Procedure Laterality Date  . NO PAST SURGERIES    . TENDON REPAIR Right 05/15/2018   Procedure: EXTENSOR TENDON REPAIR RIGHT THUMB;  Surgeon: Knute Neu, MD;  Location: [facility] OR;  Service: Plastics;  Laterality: Right;   Allergies  Allergen Reactions  . Ampicillin Rash    Has patient had a PCN reaction causing immediate rash, facial/tongue/throat swelling, SOB or lightheadedness with hypotension: Yes Has patient had a PCN reaction causing severe rash involving mucus membranes or skin necrosis: No Has patient had a PCN reaction that required hospitalization: No Has patient had a PCN reaction occurring within the last 10 years: Yes If all of the above answers are "NO", then may proceed with Cephalosporin use.    No current facility-administered medications on  file prior to encounter.    Current Outpatient Medications on File Prior to Encounter  Medication Sig Dispense Refill  . cetirizine (ZYRTEC) 10 MG tablet Take 1 tablet (10 mg total) by mouth daily. 15 tablet 0  . HYDROcodone-acetaminophen (NORCO/VICODIN) 5-325 MG tablet Take 1 tablet by mouth every 4 (four) hours as needed for moderate pain. (Patient not taking: Reported on 07/23/2018) 20 tablet 0  . ondansetron (ZOFRAN) 4 MG tablet Take 1 tablet as needed for nausea, no more than once daily 20 tablet 5  . rizatriptan (MAXALT-MLT) 10 MG disintegrating tablet Take 1 tablet under your tongue with a nonsteroidal medication and onset of migraine may repeat an additional tablet in 2 hours if needed 10 tablet 5  . tiZANidine (ZANAFLEX) 4 MG tablet TAKE 1 TABLET BY MOUTH AT BEDTIME AS NEEDED FOR SEVERE PAIN 10 tablet 5  . [DISCONTINUED] ESTARYLLA 0.25-35 MG-MCG tablet TK 1 T PO D    . [DISCONTINUED] topiramate (TOPAMAX) 25 MG tablet TAKE 4 TABLETS BY MOUTH AT NIGHT TIME 124 tablet 0   Social History   Socioeconomic History  . Marital status: Single    Spouse name: Not on file  . Number of children: Not on file  . Years of education: Not on file  . Highest education level: Not on file  Occupational History  . Not on file  Social Needs  . Financial resource strain: Not on file  . Food insecurity    Worry: Not on file  Inability: Not on file  . Transportation needs    Medical: Not on file    Non-medical: Not on file  Tobacco Use  . Smoking status: Never Smoker  . Smokeless tobacco: Never Used  Substance and Sexual Activity  . Alcohol use: No  . Drug use: No  . Sexual activity: Not on file  Lifestyle  . Physical activity    Days per week: Not on file    Minutes per session: Not on file  . Stress: Not on file  Relationships  . Social Musicianconnections    Talks on phone: Not on file    Gets together: Not on file    Attends religious service: Not on file    Active member of club or  organization: Not on file    Attends meetings of clubs or organizations: Not on file    Relationship status: Not on file  . Intimate partner violence    Fear of current or ex partner: Not on file    Emotionally abused: Not on file    Physically abused: Not on file    Forced sexual activity: Not on file  Other Topics Concern  . 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   She was home schooled.   She lives with her dad only. She has no siblings.   She enjoys playing with her dogs and being on her phone.   Family History  Problem Relation Age of Onset  . Healthy Mother   . Healthy Father     OBJECTIVE:  Vitals:   02/18/19 1207  BP: 115/75  Pulse: 89  Resp: 20  Temp: 99 F (37.2 C)  SpO2: 98%     General appearance: alert; appears mildly fatigued, but nontoxic; speaking in full sentences and tolerating own secretions HEENT: NCAT; Ears: EACs clear, TMs pearly gray; Eyes: PERRL.  EOM grossly intact. Sinuses: TTP over maxillary sinuses; Nose: nares patent without rhinorrhea, Throat: oropharynx clear, tonsils non erythematous or enlarged, uvula midline  Neck: supple without LAD Lungs: unlabored respirations, symmetrical air entry; cough: mild; no respiratory distress; CTAB Heart: regular rate and rhythm.   Back: No CVA tenderness Abdomen: soft, nondistended, normal active bowel sounds; nontender to palpation; no guarding  Skin: warm and dry Psychological: alert and cooperative; normal mood and affect  LABS:  Results for orders placed or performed during the hospital encounter of 02/18/19 (from the past 24 hour(s))  POCT rapid strep A     Status: None   Collection Time: 02/18/19 12:16 PM  Result Value Ref Range   Rapid Strep A Screen Negative Negative  POCT urinalysis dipstick     Status: None   Collection Time: 02/18/19 12:21 PM  Result Value Ref Range   Color, UA yellow yellow   Clarity, UA clear clear   Glucose, UA negative negative  mg/dL   Bilirubin, UA negative negative   Ketones, POC UA negative negative mg/dL   Spec Grav, UA [id number] - 1.025   Blood, UA negative negative   pH, UA 6.5 5.0 - 8.0   Protein Ur, POC negative negative mg/dL   Urobilinogen, UA 0.2 0.2 or 1.0 E.U./dL   Nitrite, UA Negative Negative   Leukocytes, UA Negative Negative  POCT urine pregnancy     Status: None   Collection Time: 02/18/19 12:25 PM  Result Value Ref Range   Preg Test, Ur Negative Negative     ASSESSMENT & PLAN:  1.  Acute non-recurrent maxillary sinusitis   2. Suspected COVID-19 virus infection   3. Dysuria   4. Cough     Meds ordered this encounter  Medications  . doxycycline (VIBRAMYCIN) 100 MG capsule    Sig: Take 1 capsule (100 mg total) by mouth 2 (two) times daily.    Dispense:  20 capsule    Refill:  0    Order Specific Question:   Supervising Provider    Answer:   Raylene Everts [[id number]]  . benzonatate (TESSALON) 100 MG capsule    Sig: Take 1 capsule (100 mg total) by mouth every 8 (eight) hours.    Dispense:  21 capsule    Refill:  0    Order Specific Question:   Supervising Provider    Answer:   Raylene Everts [[id number]]   Sinus infection/ COVID test: Strep was negative.  Culture sent.   COVID testing ordered.  It will take between 5-7 days for test results.  Someone will contact you regarding abnormal results.    In the meantime: You should remain isolated in your home for 10 days from symptom onset AND greater than 72 hours after symptoms resolution (absence of fever without the use of fever-reducing medication and improvement in respiratory symptoms), whichever is longer Get plenty of rest and push fluids Tessalon Perles prescribed for cough Doxycycline prescribed for sinus infection.  Take as directed and to completion You may use OTC zyrtec for nasal congestion, runny nose, and/or sore throat You may use OTC flonase for for nasal congestion and runny nose Use medications daily for  symptom relief Use OTC medications like ibuprofen or tylenol as needed fever or pain Call or go to the ED if you have any new or worsening symptoms such as fever, worsening cough, shortness of breath, chest tightness, chest pain, turning blue, changes in mental status, etc...   Burning with urination: Urine did not show signs of infection We will send out to culture and notify you abnormal results Push fluids Continue with AZO as needed Follow up with PCP as needed Return or go to the ED if you have any new or worsening symptoms such as nausea, vomiting, abdominal pain, vaginal symptoms, etc...  Reviewed expectations re: course of current medical issues. Questions answered. Outlined signs and symptoms indicating need for more acute intervention. Patient verbalized understanding. After Visit Summary given.         Lestine Box, PA-C 02/18/19 1248

## 2019-02-18 NOTE — ED Triage Notes (Signed)
Pt also states that she has had dysuria since Sunday as well

## 2019-02-18 NOTE — Discharge Instructions (Signed)
Sinus infection/ COVID test: Strep was negative.  Culture sent.   COVID testing ordered.  It will take between 5-7 days for test results.  Someone will contact you regarding abnormal results.    In the meantime: You should remain isolated in your home for 10 days from symptom onset AND greater than 72 hours after symptoms resolution (absence of fever without the use of fever-reducing medication and improvement in respiratory symptoms), whichever is longer Get plenty of rest and push fluids Tessalon Perles prescribed for cough Doxycycline prescribed for sinus infection.  Take as directed and to completion You may use OTC zyrtec for nasal congestion, runny nose, and/or sore throat You may use OTC flonase for for nasal congestion and runny nose Use medications daily for symptom relief Use OTC medications like ibuprofen or tylenol as needed fever or pain Call or go to the ED if you have any new or worsening symptoms such as fever, worsening cough, shortness of breath, chest tightness, chest pain, turning blue, changes in mental status, etc...   Burning with urination: Urine did not show signs of infection We will send out to culture and notify you abnormal results Push fluids Continue with AZO as needed Follow up with PCP as needed Return or go to the ED if you have any new or worsening symptoms such as nausea, vomiting, abdominal pain, vaginal symptoms, etc..[name]

## 2019-02-18 NOTE — ED Triage Notes (Signed)
Pt presents with c/o sore throat and cold symptoms that began a couple weeks ago

## 2019-02-20 ENCOUNTER — Telehealth: Payer: Self-pay

## 2019-02-20 NOTE — Telephone Encounter (Signed)
Pt called for results- results not back. Pt verbalized understanding.

## 2019-02-21 LAB — CULTURE, GROUP A STREP (THRC)

## 2019-02-21 LAB — NOVEL CORONAVIRUS, NAA: SARS-CoV-2, NAA: NOT DETECTED

## 2019-03-24 ENCOUNTER — Encounter (INDEPENDENT_AMBULATORY_CARE_PROVIDER_SITE_OTHER): Payer: Self-pay

## 2019-04-07 ENCOUNTER — Ambulatory Visit
Admission: EM | Admit: 2019-04-07 | Discharge: 2019-04-07 | Disposition: A | Discharge status: Home / Self Care | Payer: BLUE CROSS/BLUE SHIELD | Attending: Emergency Medicine | Admitting: Emergency Medicine

## 2019-04-07 ENCOUNTER — Encounter: Payer: Self-pay | Admitting: Emergency Medicine

## 2019-04-07 ENCOUNTER — Other Ambulatory Visit: Payer: Self-pay

## 2019-04-07 DIAGNOSIS — R531 Weakness: Secondary | ICD-10-CM | POA: Diagnosis not present

## 2019-04-07 DIAGNOSIS — Z20822 Contact with and (suspected) exposure to covid-19: Secondary | ICD-10-CM | POA: Diagnosis not present

## 2019-04-07 DIAGNOSIS — R42 Dizziness and giddiness: Secondary | ICD-10-CM

## 2019-04-07 LAB — POCT URINALYSIS DIP (MANUAL ENTRY)
Bilirubin, UA: NEGATIVE
Blood, UA: NEGATIVE
Glucose, UA: NEGATIVE mg/dL
Ketones, POC UA: NEGATIVE mg/dL
Leukocytes, UA: NEGATIVE
Nitrite, UA: NEGATIVE
Protein Ur, POC: NEGATIVE mg/dL
Spec Grav, UA: 1.02 (ref 1.010–1.025)
Urobilinogen, UA: 0.2 E.U./dL
pH, UA: 8.5 — AB (ref 5.0–8.0)

## 2019-04-07 LAB — POCT FASTING CBG KUC MANUAL ENTRY: POCT Glucose (KUC): 106 mg/dL — AB (ref 70–99)

## 2019-04-07 LAB — POCT URINE PREGNANCY: Preg Test, Ur: NEGATIVE

## 2019-04-07 MED ORDER — MECLIZINE HCL 25 MG PO TABS: 25.0000 mg | ORAL_TABLET | Freq: Three times a day (TID) | ORAL | 0 refills | Status: DC | PRN

## 2019-04-07 NOTE — ED Provider Notes (Signed)
[facility]    CSN: [id number] Arrival date & time: 04/07/19  1256      History   Chief Complaint No chief complaint on file.   HPI Hannah Walker is a 19 y.o. female.   Patient complains of dizziness that began this morning.  Denies a precipitating event, trauma, or recent URI within the past month.  Describes the dizziness as "the room spinning".  She states that it is t intermittent with episodes lasting 10 seconds seconds/.  Has tried ibuprofen without relief.  Symptoms made worse with movement.  Denies  previous symptoms.  Denies fever, chills, nausea, vomiting, hearing changes, tinnitus, ear pain, chest pain, syncope, SOB, weakness, slurred speech, memory or emotional changes, facial drooping/ asymmetry, incoordination, numbness or tingling, abdominal pain, changes in bowel or bladder habits.    The history is provided by the patient. No language interpreter was used.    Past Medical History:  Diagnosis Date  . Anxiety   . Headache   . PCO (polycystic ovaries)   . Renal disorder    kidney stones    Patient Active Problem List   Diagnosis Date Noted  . Episodic tension-type headache, not intractable 01/07/2018  . Migraine without aura and without status migrainosus, not intractable 07/23/2017  . New daily persistent headache 02/27/2017  . Concussion without loss of consciousness 02/27/2017    Past Surgical History:  Procedure Laterality Date  . NO PAST SURGERIES    . TENDON REPAIR Right 05/15/2018   Procedure: EXTENSOR TENDON REPAIR RIGHT THUMB;  Surgeon: Knute Neu, MD;  Location: [facility];  Service: Plastics;  Laterality: Right;    OB History   No obstetric history on file.      Home Medications    Prior to Admission medications   Medication Sig Start Date End Date Taking? Authorizing Provider  ibuprofen (ADVIL) 200 MG tablet Take 200 mg by mouth every 6 (six) hours as needed.   Yes [provider]  benzonatate (TESSALON) 100 MG  capsule Take 1 capsule (100 mg total) by mouth every 8 (eight) hours. 02/18/19   Wurst, [place], PA-C  cetirizine (ZYRTEC) 10 MG tablet Take 1 tablet (10 mg total) by mouth daily. 10/27/17   Cathie Hoops, Amy V, PA-C  doxycycline (VIBRAMYCIN) 100 MG capsule Take 1 capsule (100 mg total) by mouth 2 (two) times daily. 02/18/19   Wurst, [place], PA-C  HYDROcodone-acetaminophen (NORCO/VICODIN) 5-325 MG tablet Take 1 tablet by mouth every 4 (four) hours as needed for moderate pain. Patient not taking: Reported on 07/23/2018 05/15/18 05/15/19  Knute Neu, MD  ondansetron [facility]) 4 MG tablet Take 1 tablet as needed for nausea, no more than once daily 10/22/17   Deetta Perla, MD  rizatriptan (MAXALT-MLT) 10 MG disintegrating tablet Take 1 tablet under your tongue with a nonsteroidal medication and onset of migraine may repeat an additional tablet in 2 hours if needed 04/16/18   Deetta Perla, MD  tiZANidine (ZANAFLEX) 4 MG tablet TAKE 1 TABLET BY MOUTH AT BEDTIME AS NEEDED FOR SEVERE PAIN 10/22/17   Deetta Perla, MD  ESTARYLLA 0.25-35 MG-MCG tablet TK 1 T PO D 04/08/18 02/18/19  [provider]  topiramate (TOPAMAX) 25 MG tablet TAKE 4 TABLETS BY MOUTH AT NIGHT TIME 11/17/18 02/18/19  Deetta Perla, MD    Family History Family History  Problem Relation Age of Onset  . Healthy Mother   . Healthy Father     Social History Social History  Tobacco Use  . Smoking status: Never Smoker  . Smokeless tobacco: Never Used  Substance Use Topics  . Alcohol use: No  . Drug use: No     Allergies   Ampicillin   Review of Systems Review of Systems  Constitutional: Negative.   Respiratory: Negative.   Cardiovascular: Negative.   Musculoskeletal: Negative.   Neurological: Positive for dizziness.  ROS: All other negative   Physical Exam Triage Vital Signs ED Triage Vitals [04/07/19 1303]  Enc Vitals Group     BP 133/75     Pulse Rate 95     Resp 16     Temp 98.2 F  (36.8 C)     Temp Source Oral     SpO2 98 %     Weight      Height      Head Circumference      Peak Flow      Pain Score      Pain Loc      Pain Edu?      Excl. in GC?    No data found.  Updated Vital Signs BP 133/75 (BP Location: Right Arm)   Pulse 95   Temp 98.2 F (36.8 C) (Oral)   Resp 16   SpO2 98%   Visual Acuity Right Eye Distance:   Left Eye Distance:   Bilateral Distance:    Right Eye Near:   Left Eye Near:    Bilateral Near:     Physical Exam Vitals and nursing note reviewed.  Constitutional:      General: She is not in acute distress.    Appearance: Normal appearance. She is normal weight. She is not toxic-appearing or diaphoretic.  Cardiovascular:     Rate and Rhythm: Normal rate and regular rhythm.     Pulses: Normal pulses.     Heart sounds: Normal heart sounds. No murmur.  Pulmonary:     Effort: Pulmonary effort is normal. No respiratory distress.     Breath sounds: Normal breath sounds. No stridor. No wheezing, rhonchi or rales.  Chest:     Chest wall: No tenderness.  Neurological:     General: No focal deficit present.     Mental Status: She is alert. Mental status is at baseline.     Cranial Nerves: No cranial nerve deficit.     Sensory: No sensory deficit.     Motor: No weakness.     Coordination: Coordination normal.     Gait: Gait normal.     Deep Tendon Reflexes: Reflexes normal.      UC Treatments / Results  Labs (all labs ordered are listed, but only abnormal results are displayed) Labs Reviewed  POCT FASTING CBG [facility] MANUAL ENTRY - Abnormal; Notable for the following components:      Result Value   POCT Glucose ([facility]) 106 (*)    All other components within normal limits  POCT URINALYSIS DIP (MANUAL ENTRY) - Abnormal; Notable for the following components:   pH, UA 8.5 (*)    All other components within normal limits  URINE CULTURE  NOVEL CORONAVIRUS, NAA  CBC WITH DIFFERENTIAL/PLATELET  COMPREHENSIVE METABOLIC PANEL    POCT URINE PREGNANCY    EKG   Radiology No results found.  Procedures Procedures (including critical care time)  Medications Ordered in UC Medications - No data to display  Initial Impression / Assessment and Plan / UC Course  I have reviewed the triage vital signs and the nursing notes.  Pertinent labs &  imaging results that were available during my care of the patient were reviewed by me and considered in my medical decision making (see chart for details).   Point-of-care urine analysis test was ordered result review.  Normal urine result. Point of care CBGs test was ordered and result review.  Point-of-care CBG is 106 Point-of-care urine pregnancy test was ordered and result review.  Result is negative for pregnancy Patient is stable for discharge.  Benign physical exam. CBC, CMP test were ordered.  Patient is stable for discharge.  Advised patient to quarantine until COVID-19 test result become available.  To go to ED for worsening of symptoms.  Patient verbalized understanding of the plan of care.  Final Clinical Impressions(s) / UC Diagnoses   Final diagnoses:  Dizziness  Weakness  COVID-19 ruled out     Discharge Instructions     Urine analysis, urine pregnancy test were negative CBC and CMP were ordered. Somebody will call if result is abnormal Advised patient to drink plenty of water Advised patient to quarantine until COVID-19 test result become available Meclizine was prescribed for dizziness To go to ED for worsening of symptoms     ED Prescriptions    None     PDMP not reviewed this encounter.   Emerson Monte, [place] 04/07/19 1515

## 2019-04-07 NOTE — ED Triage Notes (Signed)
Patient felt like hands and face became puffy.  This has resolved, but now feels lightheaded and weak.    Patient took ibuprofen

## 2019-04-07 NOTE — Discharge Instructions (Addendum)
Urine analysis, urine pregnancy test were negative CBC and CMP were ordered. Somebody will call if result is abnormal Advised patient to drink plenty of water Advised patient to quarantine until COVID-19 test result become available Meclizine was prescribed for dizziness To go to ED for worsening of symptoms

## 2019-04-08 LAB — CBC WITH DIFFERENTIAL/PLATELET
Basophils Absolute: 0 10*3/uL (ref 0.0–0.2)
Basos: 0 %
EOS (ABSOLUTE): 0.2 10*3/uL (ref 0.0–0.4)
Eos: 2 %
Hematocrit: 39.7 % (ref 34.0–46.6)
Hemoglobin: 14.5 g/dL (ref 11.1–15.9)
Immature Grans (Abs): 0 10*3/uL (ref 0.0–0.1)
Immature Granulocytes: 0 %
Lymphocytes Absolute: 2.9 10*3/uL (ref 0.7–3.1)
Lymphs: 34 %
MCH: 31.2 pg (ref 26.6–33.0)
MCHC: 36.5 g/dL — ABNORMAL HIGH (ref 31.5–35.7)
MCV: 85 fL (ref 79–97)
Monocytes Absolute: 0.7 10*3/uL (ref 0.1–0.9)
Monocytes: 9 %
Neutrophils Absolute: 4.7 10*3/uL (ref 1.4–7.0)
Neutrophils: 55 %
Platelets: 279 10*3/uL (ref 150–450)
RBC: 4.65 x10E6/uL (ref 3.77–5.28)
RDW: 12.6 % (ref 11.7–15.4)
WBC: 8.6 10*3/uL (ref 3.4–10.8)

## 2019-04-08 LAB — COMPREHENSIVE METABOLIC PANEL
ALT: 15 IU/L (ref 0–32)
AST: 17 IU/L (ref 0–40)
Albumin/Globulin Ratio: 1.8 (ref 1.2–2.2)
Albumin: 4.5 g/dL (ref 3.9–5.0)
Alkaline Phosphatase: 96 IU/L (ref 39–117)
BUN/Creatinine Ratio: 14 (ref 9–23)
BUN: 9 mg/dL (ref 6–20)
Bilirubin Total: <0.2 mg/dL (ref 0.0–1.2)
CO2: 22 mmol/L (ref 20–29)
Calcium: 10.3 mg/dL — ABNORMAL HIGH (ref 8.7–10.2)
Chloride: 103 mmol/L (ref 96–106)
Creatinine, Ser: 0.66 mg/dL (ref 0.57–1.00)
GFR calc Af Amer: 147 mL/min/{1.73_m2} (ref >59)
GFR calc non Af Amer: 128 mL/min/{1.73_m2} (ref >59)
Globulin, Total: 2.5 g/dL (ref 1.5–4.5)
Sodium: 139 mmol/L (ref 134–144)
Total Protein: 7 g/dL (ref 6.0–8.5)

## 2019-04-08 LAB — NOVEL CORONAVIRUS, NAA: SARS-CoV-2, NAA: NOT DETECTED

## 2019-04-09 LAB — URINE CULTURE: Culture: NO GROWTH

## 2019-04-16 ENCOUNTER — Ambulatory Visit (INDEPENDENT_AMBULATORY_CARE_PROVIDER_SITE_OTHER): Payer: Managed Care, Other (non HMO) | Admitting: Pediatrics

## 2019-05-06 ENCOUNTER — Ambulatory Visit (INDEPENDENT_AMBULATORY_CARE_PROVIDER_SITE_OTHER): Payer: Managed Care, Other (non HMO) | Admitting: Pediatrics

## 2019-05-14 ENCOUNTER — Ambulatory Visit (HOSPITAL_COMMUNITY)
Admission: EM | Admit: 2019-05-14 | Discharge: 2019-05-14 | Disposition: A | Discharge status: Home / Self Care | Payer: BLUE CROSS/BLUE SHIELD | Attending: Urgent Care | Admitting: Urgent Care

## 2019-05-14 ENCOUNTER — Encounter (HOSPITAL_COMMUNITY): Payer: Self-pay

## 2019-05-14 ENCOUNTER — Other Ambulatory Visit: Payer: Self-pay

## 2019-05-14 DIAGNOSIS — R112 Nausea with vomiting, unspecified: Secondary | ICD-10-CM

## 2019-05-14 DIAGNOSIS — R102 Pelvic and perineal pain: Secondary | ICD-10-CM

## 2019-05-14 DIAGNOSIS — Z3202 Encounter for pregnancy test, result negative: Secondary | ICD-10-CM

## 2019-05-14 DIAGNOSIS — Z975 Presence of (intrauterine) contraceptive device: Secondary | ICD-10-CM

## 2019-05-14 LAB — POC URINE PREG, ED: Preg Test, Ur: NEGATIVE

## 2019-05-14 LAB — POCT URINALYSIS DIP (DEVICE)
Bilirubin Urine: NEGATIVE
Glucose, UA: NEGATIVE mg/dL
Hgb urine dipstick: NEGATIVE
Ketones, ur: NEGATIVE mg/dL
Nitrite: NEGATIVE
Protein, ur: NEGATIVE mg/dL
Specific Gravity, Urine: 1.025 (ref 1.005–1.030)
Urobilinogen, UA: 0.2 mg/dL (ref 0.0–1.0)
pH: 7 (ref 5.0–8.0)

## 2019-05-14 LAB — POCT PREGNANCY, URINE: Preg Test, Ur: NEGATIVE

## 2019-05-14 MED ORDER — ONDANSETRON 8 MG PO TBDP: 8.0000 mg | ORAL_TABLET | Freq: Three times a day (TID) | ORAL | 0 refills | Status: DC | PRN

## 2019-05-14 MED ORDER — PROMETHAZINE HCL 25 MG PO TABS: 25.0000 mg | ORAL_TABLET | Freq: Four times a day (QID) | ORAL | 0 refills | Status: DC | PRN

## 2019-05-14 MED ORDER — NAPROXEN 500 MG PO TABS: 500.0000 mg | ORAL_TABLET | Freq: Two times a day (BID) | ORAL | 0 refills | Status: DC

## 2019-05-14 NOTE — ED Triage Notes (Addendum)
Pt is here with constant vomiting since last Tuesday, she thinks its from her birth control this is overall a daily thing. She was COVID tested yesterday.

## 2019-05-14 NOTE — ED Provider Notes (Signed)
[facility]   MRN: [id number] DOB: 03-10-2001  Subjective:   Hannah Walker is a 19 y.o. female presenting for 2 week hx of persistent nausea and vomiting in the mornings. Had intermittent lower left quadrant/pelvic pains.  Started spotting ~2 weeks ago.  Has been using Tylenol.  Also using Zofran tablets without relief.  Had Nexplanon implant in 07/2018 after having Nexplanon implanted. Has not had problems since then.  She discussed her symptoms with her gynecologist, was advised to take a pregnancy test at home which she did.  This was negative.  However, concern was that she still has some kind of pregnancy, possible ectopic pregnancy.  They advise she come to our clinic for consideration of blood test to rule this out.  Patient also reports a history of intermittent ovarian cyst.  No current facility-administered medications for this encounter.  Current Outpatient Medications:  .  norethindrone-ethinyl estradiol (CYCLAFEM) 0.5/0.75/1-35 MG-MCG tablet, , Disp: , Rfl:  .  cetirizine (ZYRTEC) 10 MG tablet, Take 1 tablet (10 mg total) by mouth daily., Disp: 15 tablet, Rfl: 0 .  eletriptan (RELPAX) 40 MG tablet, eletriptan 40 mg tablet, Disp: , Rfl:  .  etonogestrel (NEXPLANON) 68 MG IMPL implant, Nexplanon 68 mg subdermal implant  Inject 1 implant by subcutaneous route., Disp: , Rfl:  .  ipratropium (ATROVENT) 0.06 % nasal spray, ipratropium bromide 42 mcg (0.06 %) nasal spray  U 2 SPRAYS IEN QID, Disp: , Rfl:  .  nitrofurantoin, macrocrystal-monohydrate, (MACROBID) 100 MG capsule, nitrofurantoin monohydrate/macrocrystals 100 mg capsule, Disp: , Rfl:  .  ondansetron (ZOFRAN) 4 MG tablet, Take 1 tablet as needed for nausea, no more than once daily, Disp: 20 tablet, Rfl: 5 .  rizatriptan (MAXALT-MLT) 10 MG disintegrating tablet, Take 1 tablet under your tongue with a nonsteroidal medication and onset of migraine may repeat an additional tablet in 2 hours if needed, Disp: 10 tablet, Rfl:  5   Allergies  Allergen Reactions  . Ampicillin Rash    Has patient had a PCN reaction causing immediate rash, facial/tongue/throat swelling, SOB or lightheadedness with hypotension: Yes Has patient had a PCN reaction causing severe rash involving mucus membranes or skin necrosis: No Has patient had a PCN reaction that required hospitalization: No Has patient had a PCN reaction occurring within the last 10 years: Yes If all of the above answers are "NO", then may proceed with Cephalosporin use.     Past Medical History:  Diagnosis Date  . Anxiety   . Headache   . PCO (polycystic ovaries)   . Renal disorder    kidney stones     Past Surgical History:  Procedure Laterality Date  . NO PAST SURGERIES    . TENDON REPAIR Right 05/15/2018   Procedure: EXTENSOR TENDON REPAIR RIGHT THUMB;  Surgeon: Knute Neu, MD;  Location: [facility];  Service: Plastics;  Laterality: Right;    Family History  Problem Relation Age of Onset  . Healthy Mother   . Healthy Father     Social History   Tobacco Use  . Smoking status: Never Smoker  . Smokeless tobacco: Never Used  Substance Use Topics  . Alcohol use: No  . Drug use: No    ROS   Objective:   Vitals: BP 107/72 (BP Location: Left Arm)   Pulse 79   Temp 98.1 F (36.7 C) (Oral)   Resp 17   Wt 153 lb 9.6 oz (69.7 kg)   SpO2 100%   BMI 26.57 kg/m  Physical Exam Constitutional:      General: She is not in acute distress.    Appearance: Normal appearance. She is well-developed. She is not ill-appearing, toxic-appearing or diaphoretic.  HENT:     Head: Normocephalic and atraumatic.     Nose: Nose normal.     Mouth/Throat:     Mouth: Mucous membranes are moist.     Pharynx: Oropharynx is clear.  Eyes:     General: No scleral icterus.    Extraocular Movements: Extraocular movements intact.     Pupils: Pupils are equal, round, and reactive to light.  Cardiovascular:     Rate and Rhythm: Normal rate.  Pulmonary:      Effort: Pulmonary effort is normal.  Abdominal:     General: Bowel sounds are normal. There is no distension.     Palpations: Abdomen is soft. There is no mass.     Tenderness: There is abdominal tenderness in the suprapubic area and left lower quadrant. There is no right CVA tenderness, left CVA tenderness, guarding or rebound.  Skin:    General: Skin is warm and dry.  Neurological:     General: No focal deficit present.     Mental Status: She is alert and oriented to person, place, and time.  Psychiatric:        Mood and Affect: Mood normal.        Behavior: Behavior normal.        Thought Content: Thought content normal.        Judgment: Judgment normal.     Results for orders placed or performed during the hospital encounter of 05/14/19 (from the past 24 hour(s))  POCT urinalysis dip (device)     Status: Abnormal   Collection Time: 05/14/19 11:10 AM  Result Value Ref Range   Glucose, UA NEGATIVE NEGATIVE mg/dL   Bilirubin Urine NEGATIVE NEGATIVE   Ketones, ur NEGATIVE NEGATIVE mg/dL   Specific Gravity, Urine 1.025 1.005 - 1.030   Hgb urine dipstick NEGATIVE NEGATIVE   pH 7.0 5.0 - 8.0   Protein, ur NEGATIVE NEGATIVE mg/dL   Urobilinogen, UA 0.2 0.0 - 1.0 mg/dL   Nitrite NEGATIVE NEGATIVE   Leukocytes,Ua TRACE (A) NEGATIVE  POC urine pregnancy     Status: None   Collection Time: 05/14/19 11:24 AM  Result Value Ref Range   Preg Test, Ur NEGATIVE NEGATIVE  Pregnancy, urine POC     Status: None   Collection Time: 05/14/19 11:24 AM  Result Value Ref Range   Preg Test, Ur NEGATIVE NEGATIVE    Assessment and Plan :   1. Nausea and vomiting, intractability of vomiting not specified, unspecified vomiting type   2. Pelvic pain in female   3. Nexplanon in place     Urine pregnancy test is negative.  Unfortunately we do not have the correct test tubes to perform beta hCG qualitative in clinic.  I printed requisition for her to take this to the laboratory.  Recommended she  discuss obtaining a pelvic and transvaginal ultrasound as an outpatient through her gynecologist.  Will use supportive care including Zofran ODT, Phenergan suppositories and naproxen. Counseled patient on potential for adverse effects with medications prescribed/recommended today, ER and return-to-clinic precautions discussed, patient verbalized understanding.    Jaynee Eagles, PA-C 05/14/19 1150

## 2019-05-21 ENCOUNTER — Telehealth (HOSPITAL_COMMUNITY): Payer: Self-pay

## 2019-05-31 ENCOUNTER — Encounter: Payer: BLUE CROSS/BLUE SHIELD | Admitting: Adult Health

## 2019-06-02 ENCOUNTER — Other Ambulatory Visit (INDEPENDENT_AMBULATORY_CARE_PROVIDER_SITE_OTHER): Payer: Self-pay | Admitting: Pediatrics

## 2019-06-02 DIAGNOSIS — G43009 Migraine without aura, not intractable, without status migrainosus: Secondary | ICD-10-CM

## 2019-06-03 ENCOUNTER — Other Ambulatory Visit: Payer: Self-pay

## 2019-06-03 ENCOUNTER — Encounter: Payer: Self-pay | Admitting: Adult Health

## 2019-06-03 ENCOUNTER — Encounter: Payer: BLUE CROSS/BLUE SHIELD | Admitting: Adult Health

## 2019-06-03 ENCOUNTER — Ambulatory Visit (INDEPENDENT_AMBULATORY_CARE_PROVIDER_SITE_OTHER): Payer: BLUE CROSS/BLUE SHIELD | Admitting: Adult Health

## 2019-06-03 VITALS — BP 111/76 | HR 83 | Ht 65.0 in | Wt 153.0 lb

## 2019-06-03 DIAGNOSIS — Z975 Presence of (intrauterine) contraceptive device: Secondary | ICD-10-CM | POA: Diagnosis not present

## 2019-06-03 DIAGNOSIS — R5383 Other fatigue: Secondary | ICD-10-CM | POA: Diagnosis not present

## 2019-06-03 DIAGNOSIS — R102 Pelvic and perineal pain: Secondary | ICD-10-CM | POA: Insufficient documentation

## 2019-06-03 DIAGNOSIS — Z8742 Personal history of other diseases of the female genital tract: Secondary | ICD-10-CM

## 2019-06-03 DIAGNOSIS — R1032 Left lower quadrant pain: Secondary | ICD-10-CM | POA: Insufficient documentation

## 2019-06-03 DIAGNOSIS — N926 Irregular menstruation, unspecified: Secondary | ICD-10-CM | POA: Insufficient documentation

## 2019-06-03 HISTORY — DX: Personal history of other diseases of the female genital tract: Z87.42

## 2019-06-03 LAB — POCT HEMOGLOBIN: Hemoglobin: 14 g/dL (ref 11–14.6)

## 2019-06-03 MED ORDER — ONDANSETRON 8 MG PO TBDP: 8.0000 mg | ORAL_TABLET | Freq: Three times a day (TID) | ORAL | 1 refills | Status: DC | PRN

## 2019-06-03 MED ORDER — MEGESTROL ACETATE 40 MG PO TABS: ORAL_TABLET | ORAL | 0 refills | Status: DC

## 2019-06-03 NOTE — Progress Notes (Signed)
Patient ID: Hannah Walker, female   DOB: Jan 24, 2001, 19 y.o.   MRN: [id number] History of Present Illness: Hannah Walker is a 19 year old white female, single, G0P0 with nexplanon , in complaining of bleeding.IT started with spotting second week in February and she has some nausea and then bleeding got heavier Sunday and has had pelvic pain with nausea and vomiting and felt tired. She had negative HPT and spoke with Dr Langston Masker at [facility] in [place], who placed the nexplanon in May 2020. She has history of ovarian cyst she says and she fels now like she did then. 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   Current Medications, Allergies, Past Medical History, Past Surgical History, Family History and Social History were reviewed in [employer] record.     Review of Systems: Bleeding since mid February, heavier now +Tired +nausea and vomiting +Dizzy at times.    Physical Exam:BP 111/76 (BP Location: Left Arm, Patient Position: Sitting, Cuff Size: Normal)   Pulse 83   Ht 5\' 5"  (1.651 m)   Wt 153 lb (69.4 kg)   BMI 25.46 kg/m HGB 14. General:  Well developed, well nourished, no acute distress Skin:  Warm and dry Neck:  Midline trachea, normal thyroid, good ROM, no lymphadenopathy Lungs; Clear to auscultation bilaterally Cardiovascular: Regular rate and rhythm Abdomen:  Soft, non tender, no hepatosplenomegaly Pelvic:  External genitalia is normal in appearance, no lesions.  The vagina is normal in appearance,+blood. Urethra has no lesions or masses. The cervix is smooth, noCMT.  Uterus is felt to be normal size, shape, and contour, mildly tender.  No adnexal masses or tenderness noted.Bladder is non tender, no masses felt. Extremities/musculoskeletal:  No swelling or varicosities noted, no clubbing or cyanosis Psych:  No mood changes, alert and cooperative,seems happy Fall risk is low PHQ 2 score is 0. Examination chaperoned by LPN  Impression and  Plan:  1. Irregular bleeding Will Rx megace to stop the bleeding Will refill Zofran  Meds ordered this encounter  Medications  . ondansetron (ZOFRAN-ODT) 8 MG disintegrating tablet    Sig: Take 1 tablet (8 mg total) by mouth every 8 (eight) hours as needed for nausea or vomiting.    Dispense:  20 tablet    Refill:  1    Order Specific Question:   Supervising Provider    Answer:   Malachy Mood, LUTHER H [[id number]]  . megestrol (MEGACE) 40 MG tablet    Sig: Take 3 x 5 days then 2 x 5 days then 1 daily    Dispense:  45 tablet    Refill:  0    Order Specific Question:   Supervising Provider    Answer:   Despina Hidden [[id number]]  Will get GYN Lazaro Arms to assess uterus and ovaries   2. Nexplanon in place   3. History of ovarian cyst Will get GYN [place] in a week and talk when results back   4. Tired Push fluids  5. Pelvic pain Will get GUN [place] in a week to assess uterus and ovaries  Note given for work today, return 06/04/19

## 2019-06-09 ENCOUNTER — Other Ambulatory Visit: Payer: BLUE CROSS/BLUE SHIELD

## 2019-06-24 ENCOUNTER — Ambulatory Visit: Payer: BLUE CROSS/BLUE SHIELD | Attending: Internal Medicine

## 2019-06-24 ENCOUNTER — Ambulatory Visit: Payer: BLUE CROSS/BLUE SHIELD

## 2019-06-24 DIAGNOSIS — Z23 Encounter for immunization: Secondary | ICD-10-CM

## 2019-06-24 NOTE — Progress Notes (Signed)
   Covid-19 Vaccination Clinic  Name:  Hannah Walker    MRN: [id number] DOB: 2000/08/16  06/24/2019  Ms. Hyden was observed post Covid-19 immunization for 15 minutes without incident. She was provided with Vaccine Information Sheet and instruction to access the V-Safe system.   Ms. Roat was instructed to call 911 with any severe reactions post vaccine: [name] Difficulty breathing  . Swelling of face and throat  . A fast heartbeat  . A bad rash all over body  . Dizziness and weakness   Immunizations Administered    Name Date Dose VIS Date Route   Moderna COVID-19 Vaccine 06/24/2019 12:35 PM 0.5 mL 03/02/2019 Intramuscular   Manufacturer: Moderna   Lot: [id number]   NDC: 77824-235-36

## 2019-07-27 ENCOUNTER — Ambulatory Visit: Payer: BLUE CROSS/BLUE SHIELD | Attending: Internal Medicine

## 2019-07-27 DIAGNOSIS — Z23 Encounter for immunization: Secondary | ICD-10-CM

## 2019-07-27 NOTE — Progress Notes (Signed)
   Covid-19 Vaccination Clinic  Name:  LUANNE KRZYZANOWSKI    MRN: [id number] DOB: 18-Aug-2000  07/27/2019  Ms. Slaydon was observed post Covid-19 immunization for 15 minutes without incident. She was provided with Vaccine Information Sheet and instruction to access the V-Safe system.   Ms. Mirabal was instructed to call 911 with any severe reactions post vaccine: [name] Difficulty breathing  . Swelling of face and throat  . A fast heartbeat  . A bad rash all over body  . Dizziness and weakness   Immunizations Administered    Name Date Dose VIS Date Route   Moderna COVID-19 Vaccine 07/27/2019 12:44 PM 0.5 mL 03/2019 Intramuscular   Manufacturer: Moderna   Lot: [id number]   NDC: 68341-962-22

## 2019-09-17 ENCOUNTER — Other Ambulatory Visit: Payer: Self-pay

## 2019-09-17 ENCOUNTER — Emergency Department (HOSPITAL_COMMUNITY)
Admission: EM | Admit: 2019-09-17 | Discharge: 2019-09-17 | Discharge status: Against Medical Advice | Payer: BLUE CROSS/BLUE SHIELD | Attending: Emergency Medicine | Admitting: Emergency Medicine

## 2019-09-17 ENCOUNTER — Encounter (HOSPITAL_COMMUNITY): Payer: Self-pay | Admitting: Emergency Medicine

## 2019-09-17 ENCOUNTER — Ambulatory Visit (INDEPENDENT_AMBULATORY_CARE_PROVIDER_SITE_OTHER)
Admission: EM | Admit: 2019-09-17 | Discharge: 2019-09-17 | Disposition: A | Discharge status: Home / Self Care | Payer: BLUE CROSS/BLUE SHIELD | Source: Home / Self Care

## 2019-09-17 ENCOUNTER — Encounter (HOSPITAL_COMMUNITY): Payer: Self-pay | Admitting: *Deleted

## 2019-09-17 DIAGNOSIS — G43109 Migraine with aura, not intractable, without status migrainosus: Secondary | ICD-10-CM

## 2019-09-17 DIAGNOSIS — Z5321 Procedure and treatment not carried out due to patient leaving prior to being seen by health care provider: Secondary | ICD-10-CM | POA: Diagnosis not present

## 2019-09-17 DIAGNOSIS — G43909 Migraine, unspecified, not intractable, without status migrainosus: Secondary | ICD-10-CM | POA: Insufficient documentation

## 2019-09-17 MED ORDER — DEXAMETHASONE SODIUM PHOSPHATE 10 MG/ML IJ SOLN
10.0000 mg | Freq: Once | INTRAMUSCULAR | Status: AC
  Administered 2019-09-17: 10 mg via INTRAMUSCULAR

## 2019-09-17 MED ORDER — DEXAMETHASONE SODIUM PHOSPHATE 10 MG/ML IJ SOLN
INTRAMUSCULAR | Status: AC
  Filled 2019-09-17: qty 1

## 2019-09-17 MED ORDER — KETOROLAC TROMETHAMINE 30 MG/ML IJ SOLN
INTRAMUSCULAR | Status: AC
  Filled 2019-09-17: qty 1

## 2019-09-17 MED ORDER — METOCLOPRAMIDE HCL 5 MG/ML IJ SOLN
INTRAMUSCULAR | Status: AC
  Filled 2019-09-17: qty 2

## 2019-09-17 MED ORDER — METOCLOPRAMIDE HCL 5 MG/ML IJ SOLN
5.0000 mg | Freq: Once | INTRAMUSCULAR | Status: AC
  Administered 2019-09-17: 5 mg via INTRAMUSCULAR

## 2019-09-17 MED ORDER — KETOROLAC TROMETHAMINE 30 MG/ML IJ SOLN
30.0000 mg | Freq: Once | INTRAMUSCULAR | Status: AC
  Administered 2019-09-17: 30 mg via INTRAMUSCULAR

## 2019-09-17 NOTE — ED Triage Notes (Signed)
Migraine started at 3am. Vomited several times. Photophobia.   She has history of same and has tried her migraine meds. Last seen in ER for migraine in Jan 2021

## 2019-09-17 NOTE — ED Provider Notes (Signed)
[facility]    CSN: [id number] Arrival date & time: 09/17/19  1932   History   Chief Complaint Chief Complaint  Patient presents with  . Migraine    HPI Hannah Walker is a 19 y.o. female with past medical history of migraine headaches presents to urgent care today with complaints of headache.  Patient states headaches like her typical migraines however unrelieved with Maxalt taken x 2 prior to visit.  Patient reports generalized throbbing pain worse in temporal region with nausea, vomiting and photophobia    Past Medical History:  Diagnosis Date  . Anxiety   . Headache   . PCO (polycystic ovaries)   . Renal disorder    kidney stones    Patient Active Problem List   Diagnosis Date Noted  . Tired 06/03/2019  . History of ovarian cyst 06/03/2019  . Nexplanon in place 06/03/2019  . LLQ pain 06/03/2019  . Irregular bleeding 06/03/2019  . Pelvic pain 06/03/2019  . Episodic tension-type headache, not intractable 01/07/2018  . Migraine without aura and without status migrainosus, not intractable 07/23/2017  . New daily persistent headache 02/27/2017  . Concussion without loss of consciousness 02/27/2017    Past Surgical History:  Procedure Laterality Date  . NO PAST SURGERIES    . TENDON REPAIR Right 05/15/2018   Procedure: EXTENSOR TENDON REPAIR RIGHT THUMB;  Surgeon: Knute Neu, MD;  Location: [facility];  Service: Plastics;  Laterality: Right;    OB History    Gravida  0   Para  0   Term  0   Preterm  0   AB  0   Living  0     SAB  0   TAB  0   Ectopic  0   Multiple  0   Live Births  0            Home Medications    Prior to Admission medications   Medication Sig Start Date End Date Taking? Authorizing Provider  ondansetron (ZOFRAN-ODT) 8 MG disintegrating tablet Take 1 tablet (8 mg total) by mouth every 8 (eight) hours as needed for nausea or vomiting. 06/03/19  Yes Cyril Mourning A, NP  rizatriptan (MAXALT-MLT) 10 MG  disintegrating tablet TAKE 1 TABLET BY MOUTH UNDER YOUR TONGUE WITH NONSTEROIDAL MEDICATION AND ONSET OF MIGRAINE MAY REPEAT AN ADDITIONAL TABLET IN 2 HOURS IF NEEDED 06/02/19  Yes Deetta Perla, MD  etonogestrel (NEXPLANON) 68 MG IMPL implant Nexplanon 68 mg subdermal implant  Inject 1 implant by subcutaneous route.    [provider]  megestrol (MEGACE) 40 MG tablet Take 3 x 5 days then 2 x 5 days then 1 daily 06/03/19   Adline Potter, NP  ondansetron (ZOFRAN) 4 MG tablet Take 1 tablet as needed for nausea, no more than once daily 10/22/17   Deetta Perla, MD  ESTARYLLA 0.25-35 MG-MCG tablet TK 1 T PO D 04/08/18 02/18/19  [provider]  topiramate (TOPAMAX) 25 MG tablet TAKE 4 TABLETS BY MOUTH AT NIGHT TIME 11/17/18 02/18/19  Deetta Perla, MD    Family History Family History  Problem Relation Age of Onset  . Healthy Mother   . Healthy Father     Social History Social History   Tobacco Use  . Smoking status: Never Smoker  . Smokeless tobacco: Never Used  Vaping Use  . Vaping Use: Never used  Substance Use Topics  . Alcohol use: No  . Drug use: No  Allergies   Ampicillin   Review of Systems As stated in hpi, otherwise negative.    Physical Exam Triage Vital Signs ED Triage Vitals [09/17/19 1945]  Enc Vitals Group     BP 109/74     Pulse Rate 80     Resp 16     Temp 98.1 F (36.7 C)     Temp Source Oral     SpO2 100 %     Weight      Height      Head Circumference      Peak Flow      Pain Score      Pain Loc      Pain Edu?      Excl. in [place]?    No data found.  Updated Vital Signs BP 109/74 (BP Location: Left Arm)   Pulse 80   Temp 98.1 F (36.7 C) (Oral)   Resp 16   SpO2 100%     Physical Exam Constitutional:      Appearance: Normal appearance.  HENT:     Head: Normocephalic and atraumatic.  Eyes:     Extraocular Movements: Extraocular movements intact.     Pupils: Pupils are equal, round, and reactive to  light.  Neurological:     General: No focal deficit present.     Mental Status: She is alert and oriented to person, place, and time.  Psychiatric:        Mood and Affect: Mood normal.        Behavior: Behavior normal.      UC Treatments / Results  Labs (all labs ordered are listed, but only abnormal results are displayed) Labs Reviewed - No data to display  EKG   Radiology No results found.  Procedures Procedures (including critical care time)  Medications Ordered in UC Medications  ketorolac (TORADOL) 30 MG/ML injection 30 mg (30 mg Intramuscular Given 09/17/19 2038)  metoCLOPramide (REGLAN) injection 5 mg (5 mg Intramuscular Given 09/17/19 2039)  dexamethasone (DECADRON) injection 10 mg (10 mg Intramuscular Given 09/17/19 2039)    Initial Impression / Assessment and Plan / UC Course  I have reviewed the triage vital signs and the nursing notes.  Pertinent labs & imaging results that were available during my care of the patient were reviewed by me and considered in my medical decision making (see chart for details).  Migraine headache -hx of same. Unrelieved c Maxalt -IM Toradol, Reglan and decadron -f/u as needed   Final Clinical Impressions(s) / UC Diagnoses   Final diagnoses:  Migraine with aura and without status migrainosus, not intractable     Discharge Instructions     Get plenty of rest and drink plenty of fluids.  Return to urgent care or go to ER for any worsening discomfort or if symptoms persist    ED Prescriptions    None     PDMP not reviewed this encounter.   Rudolpho Sevin, NP 09/19/19 1019

## 2019-09-17 NOTE — ED Triage Notes (Signed)
Migraine headache since 0300

## 2019-09-17 NOTE — Discharge Instructions (Addendum)
Get plenty of rest and drink plenty of fluids.  Return to urgent care or go to ER for any worsening discomfort or if symptoms persist

## 2019-10-31 ENCOUNTER — Ambulatory Visit (HOSPITAL_COMMUNITY): Admit: 2019-10-31 | Discharge status: Against Medical Advice | Payer: BLUE CROSS/BLUE SHIELD | Source: Home / Self Care

## 2019-11-16 ENCOUNTER — Ambulatory Visit
Admission: EM | Admit: 2019-11-16 | Discharge: 2019-11-16 | Disposition: A | Discharge status: Home / Self Care | Payer: BLUE CROSS/BLUE SHIELD | Attending: Emergency Medicine | Admitting: Emergency Medicine

## 2019-11-16 ENCOUNTER — Other Ambulatory Visit: Payer: Self-pay

## 2019-11-16 DIAGNOSIS — Z1152 Encounter for screening for COVID-19: Secondary | ICD-10-CM | POA: Insufficient documentation

## 2019-11-16 DIAGNOSIS — J069 Acute upper respiratory infection, unspecified: Secondary | ICD-10-CM | POA: Insufficient documentation

## 2019-11-16 DIAGNOSIS — Z20822 Contact with and (suspected) exposure to covid-19: Secondary | ICD-10-CM | POA: Diagnosis present

## 2019-11-16 LAB — POCT RAPID STREP A (OFFICE): Rapid Strep A Screen: NEGATIVE

## 2019-11-16 MED ORDER — BENZONATATE 100 MG PO CAPS
100.0000 mg | ORAL_CAPSULE | Freq: Three times a day (TID) | ORAL | 0 refills | Status: DC
Start: 2019-11-16 — End: 2020-06-12

## 2019-11-16 NOTE — ED Triage Notes (Signed)
Pt presents with c/o fever that began Saturday and then developed sore throat, 4 coworkers tested positive

## 2019-11-16 NOTE — ED Provider Notes (Signed)
[facility]   [id number] 11/16/19 Arrival Time: 0820   CC: COVID symptoms  SUBJECTIVE: History from: patient.  Hannah Walker is a 19 y.o. female who presents with runny nose, congestion, ear pain, sore throat, cough and fever, with tmax of 102, x 3-4 days.  Immunized coworkers tested positive for COVID.  Patient is immunized as well.  Has tried OTC medications with relief.  Symptoms are made worse at night.  Reports previous symptoms in the past.   Denies SOB, wheezing, chest pain, nausea, vomiting,changes in bowel or bladder habits.    ROS: As per HPI.  All other pertinent ROS negative.     Past Medical History:  Diagnosis Date  . Anxiety   . Headache   . PCO (polycystic ovaries)   . Renal disorder    kidney stones   Past Surgical History:  Procedure Laterality Date  . NO PAST SURGERIES    . TENDON REPAIR Right 05/15/2018   Procedure: EXTENSOR TENDON REPAIR RIGHT THUMB;  Surgeon: Knute Neu, MD;  Location: [facility];  Service: Plastics;  Laterality: Right;   Allergies  Allergen Reactions  . Ampicillin Rash    Has patient had a PCN reaction causing immediate rash, facial/tongue/throat swelling, SOB or lightheadedness with hypotension: Yes Has patient had a PCN reaction causing severe rash involving mucus membranes or skin necrosis: No Has patient had a PCN reaction that required hospitalization: No Has patient had a PCN reaction occurring within the last 10 years: Yes If all of the above answers are "NO", then may proceed with Cephalosporin use.    No current facility-administered medications on file prior to encounter.   Current Outpatient Medications on File Prior to Encounter  Medication Sig Dispense Refill  . ondansetron (ZOFRAN) 4 MG tablet Take 1 tablet as needed for nausea, no more than once daily 20 tablet 5  . ondansetron (ZOFRAN-ODT) 8 MG disintegrating tablet Take 1 tablet (8 mg total) by mouth every 8 (eight) hours as needed for nausea or vomiting.  20 tablet 1  . rizatriptan (MAXALT-MLT) 10 MG disintegrating tablet TAKE 1 TABLET BY MOUTH UNDER YOUR TONGUE WITH NONSTEROIDAL MEDICATION AND ONSET OF MIGRAINE MAY REPEAT AN ADDITIONAL TABLET IN 2 HOURS IF NEEDED 10 tablet 5  . [DISCONTINUED] ESTARYLLA 0.25-35 MG-MCG tablet TK 1 T PO D    . [DISCONTINUED] etonogestrel (NEXPLANON) 68 MG IMPL implant Nexplanon 68 mg subdermal implant  Inject 1 implant by subcutaneous route.    . [DISCONTINUED] topiramate (TOPAMAX) 25 MG tablet TAKE 4 TABLETS BY MOUTH AT NIGHT TIME 124 tablet 0   Social History   Socioeconomic History  . Marital status: Single    Spouse name: Not on file  . Number of children: Not on file  . Years of education: Not on file  . Highest education level: Not on file  Occupational History  . Not on file  Tobacco Use  . Smoking status: Never Smoker  . Smokeless tobacco: Never Used  Vaping Use  . Vaping Use: Never used  Substance and Sexual Activity  . Alcohol use: No  . Drug use: No  . Sexual activity: Yes    Birth control/protection: Implant  Other Topics Concern  . 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   She was home schooled.   She lives with her dad only. She has no siblings.   She enjoys playing with her dogs and being on her phone.   Social  Determinants of Health   Financial Resource Strain:   . Difficulty of Paying Living Expenses:   Food Insecurity:   . Worried About Programme researcher, broadcasting/film/video in the Last Year:   . Barista in the Last Year:   Transportation Needs:   . Freight forwarder (Medical):   [name] Lack of Transportation (Non-Medical):   Physical Activity:   . Days of Exercise per Week:   . Minutes of Exercise per Session:   Stress:   . Feeling of Stress :   Social Connections:   . Frequency of Communication with Friends and Family:   . Frequency of Social Gatherings with Friends and Family:   . Attends Religious Services:   . Active Member of Clubs or  Organizations:   . Attends Banker Meetings:   [name] Marital Status:   Intimate Partner Violence:   . Fear of Current or Ex-Partner:   . Emotionally Abused:   [name] Physically Abused:   . Sexually Abused:    Family History  Problem Relation Age of Onset  . Healthy Mother   . Healthy Father     OBJECTIVE:  Vitals:   11/16/19 0832  BP: 108/77  Pulse: 95  Resp: 20  Temp: 98.1 F (36.7 C)  SpO2: 95%     General appearance: alert; appears fatigued, but nontoxic; speaking in full sentences and tolerating own secretions HEENT: NCAT; Ears: EACs clear, TMs pearly gray; Eyes: PERRL.  EOM grossly intact. Nose: nares patent with clear rhinorrhea, Throat: oropharynx clear, tonsils non erythematous or enlarged, uvula midline  Neck: supple without LAD Lungs: unlabored respirations, symmetrical air entry; cough: absent; no respiratory distress; CTAB Heart: regular rate and rhythm.   Skin: warm and dry Psychological: alert and cooperative; normal mood and affect  LABS:   No results found for this or any previous visit (from the past 24 hour(s)).   ASSESSMENT & PLAN:  1. Encounter for screening for COVID-19   2. Viral URI with cough   3. Suspected COVID-19 virus infection     Meds ordered this encounter  Medications  . benzonatate (TESSALON) 100 MG capsule    Sig: Take 1 capsule (100 mg total) by mouth every 8 (eight) hours.    Dispense:  21 capsule    Refill:  0    Order Specific Question:   Supervising Provider    Answer:   Eustace Moore [[id number]]    COVID testing ordered.  It will take between 2-5 days for test results.  Someone will contact you regarding abnormal results.    In the meantime: You should remain isolated in your home for 10 days from symptom onset AND greater than 72 hours after symptoms resolution (absence of fever without the use of fever-reducing medication and improvement in respiratory symptoms), whichever is longer Get plenty of rest and  push fluids Tessalon Perles prescribed for cough Use OTC zyrtec for nasal congestion, runny nose, and/or sore throat Use OTC flonase for nasal congestion and runny nose Use medications daily for symptom relief Use OTC medications like ibuprofen or tylenol as needed fever or pain Call or go to the ED if you have any new or worsening symptoms such as fever, worsening cough, shortness of breath, chest tightness, chest pain, turning blue, changes in mental status, etc...   Reviewed expectations re: course of current medical issues. Questions answered. Outlined signs and symptoms indicating need for more acute intervention. Patient verbalized understanding. After Visit Summary given.  Rennis Harding, PA-C 11/16/19 [phone]

## 2019-11-16 NOTE — Discharge Instructions (Signed)

## 2019-11-17 LAB — NOVEL CORONAVIRUS, NAA: SARS-CoV-2, NAA: NOT DETECTED

## 2019-11-17 LAB — SARS-COV-2, NAA 2 DAY TAT

## 2019-11-18 ENCOUNTER — Telehealth: Payer: Self-pay | Admitting: Emergency Medicine

## 2019-11-18 MED ORDER — AZITHROMYCIN 250 MG PO TABS: 250.0000 mg | ORAL_TABLET | Freq: Every day | ORAL | 0 refills | Status: DC

## 2019-11-18 NOTE — Telephone Encounter (Signed)
Pt covid is neg and was told to call office to have abx called in

## 2019-11-19 LAB — CULTURE, GROUP A STREP (THRC)

## 2019-12-11 ENCOUNTER — Ambulatory Visit
Admission: EM | Admit: 2019-12-11 | Discharge: 2019-12-11 | Disposition: A | Discharge status: Home / Self Care | Payer: BLUE CROSS/BLUE SHIELD | Attending: Emergency Medicine | Admitting: Emergency Medicine

## 2019-12-11 ENCOUNTER — Other Ambulatory Visit: Payer: Self-pay

## 2019-12-11 ENCOUNTER — Encounter: Payer: Self-pay | Admitting: Emergency Medicine

## 2019-12-11 DIAGNOSIS — Z113 Encounter for screening for infections with a predominantly sexual mode of transmission: Secondary | ICD-10-CM | POA: Insufficient documentation

## 2019-12-11 DIAGNOSIS — R300 Dysuria: Secondary | ICD-10-CM

## 2019-12-11 LAB — POCT URINALYSIS DIP (MANUAL ENTRY)
Blood, UA: NEGATIVE
Glucose, UA: NEGATIVE mg/dL
Nitrite, UA: NEGATIVE
Protein Ur, POC: 100 mg/dL — AB
Spec Grav, UA: >=1.03 — AB (ref 1.010–1.025)
Urobilinogen, UA: 1 E.U./dL
pH, UA: 6 (ref 5.0–8.0)

## 2019-12-11 LAB — POCT URINE PREGNANCY: Preg Test, Ur: NEGATIVE

## 2019-12-11 MED ORDER — PHENAZOPYRIDINE HCL 100 MG PO TABS
100.0000 mg | ORAL_TABLET | Freq: Three times a day (TID) | ORAL | 0 refills | Status: DC | PRN
Start: 2019-12-11 — End: 2020-06-12

## 2019-12-11 MED ORDER — METRONIDAZOLE 500 MG PO TABS
500.0000 mg | ORAL_TABLET | Freq: Two times a day (BID) | ORAL | 0 refills | Status: DC
Start: 2019-12-11 — End: 2020-06-12

## 2019-12-11 NOTE — ED Triage Notes (Addendum)
Vaginal irritation and thick white discharge, has tried at home yeast meds with no relief. Also burning with urination.

## 2019-12-11 NOTE — Discharge Instructions (Addendum)
Prescribed metronidazole 500 mg twice daily for 7 days (do not take while consuming alcohol and/or if breastfeeding) Take medications as prescribed and to completion If tests results are positive, please abstain from sexual activity until you and your partner(s) have been treated Follow up with PCP or [facility] if symptoms persists Return here or go to ER if you have any new or worsening symptoms fever, chills, nausea, vomiting, abdominal or pelvic pain, painful intercourse, vaginal discharge, vaginal bleeding, persistent symptoms despite treatment, etc..[name]

## 2019-12-11 NOTE — ED Provider Notes (Signed)
[facility]   [id number] 12/11/19 Arrival Time: 1059   CZ:[id number] DISCHARGE  SUBJECTIVE:  Hannah Walker is a 19 y.o. female who presents to the urgent care for complaint of dysuria, vaginal irritation and thick white discharge for the past few days.  She denies a precipitating event, recent sexual encounter or recent antibiotic use.  Patient is sexually active with 1 female partners.   Denies aggravating factors.  Has tried OTC Monistat and Diflucan with no relief.  She denies fever, chills, nausea, vomiting, abdominal or pelvic pain, urinary symptoms,  dyspareunia, vaginal rashes or lesions.   Patient's last menstrual period was 11/06/2019. Current birth control method: Compliant with BC:  ROS: As per HPI.  All other pertinent ROS negative.     Past Medical History:  Diagnosis Date  . Anxiety   . Headache   . PCO (polycystic ovaries)   . Renal disorder    kidney stones   Past Surgical History:  Procedure Laterality Date  . NO PAST SURGERIES    . TENDON REPAIR Right 05/15/2018   Procedure: EXTENSOR TENDON REPAIR RIGHT THUMB;  Surgeon: Knute Neu, MD;  Location: [facility] OR;  Service: Plastics;  Laterality: Right;   Allergies  Allergen Reactions  . Ampicillin Rash    Has patient had a PCN reaction causing immediate rash, facial/tongue/throat swelling, SOB or lightheadedness with hypotension: Yes Has patient had a PCN reaction causing severe rash involving mucus membranes or skin necrosis: No Has patient had a PCN reaction that required hospitalization: No Has patient had a PCN reaction occurring within the last 10 years: Yes If all of the above answers are "NO", then may proceed with Cephalosporin use.    No current facility-administered medications on file prior to encounter.   Current Outpatient Medications on File Prior to Encounter  Medication Sig Dispense Refill  . azithromycin (ZITHROMAX) 250 MG tablet Take 1 tablet (250 mg total) by mouth daily. Take first  2 tablets together, then 1 every day until finished. 6 tablet 0  . benzonatate (TESSALON) 100 MG capsule Take 1 capsule (100 mg total) by mouth every 8 (eight) hours. 21 capsule 0  . ondansetron (ZOFRAN) 4 MG tablet Take 1 tablet as needed for nausea, no more than once daily 20 tablet 5  . ondansetron (ZOFRAN-ODT) 8 MG disintegrating tablet Take 1 tablet (8 mg total) by mouth every 8 (eight) hours as needed for nausea or vomiting. 20 tablet 1  . rizatriptan (MAXALT-MLT) 10 MG disintegrating tablet TAKE 1 TABLET BY MOUTH UNDER YOUR TONGUE WITH NONSTEROIDAL MEDICATION AND ONSET OF MIGRAINE MAY REPEAT AN ADDITIONAL TABLET IN 2 HOURS IF NEEDED 10 tablet 5  . [DISCONTINUED] ESTARYLLA 0.25-35 MG-MCG tablet TK 1 T PO D    . [DISCONTINUED] etonogestrel (NEXPLANON) 68 MG IMPL implant Nexplanon 68 mg subdermal implant  Inject 1 implant by subcutaneous route.    . [DISCONTINUED] topiramate (TOPAMAX) 25 MG tablet TAKE 4 TABLETS BY MOUTH AT NIGHT TIME 124 tablet 0    Social History   Socioeconomic History  . Marital status: Single    Spouse name: Not on file  . Number of children: Not on file  . Years of education: Not on file  . Highest education level: Not on file  Occupational History  . Not on file  Tobacco Use  . Smoking status: Never Smoker  . Smokeless tobacco: Never Used  Vaping Use  . Vaping Use: Never used  Substance and Sexual Activity  . Alcohol use: No  .  Drug use: No  . Sexual activity: Yes    Birth control/protection: Implant  Other Topics Concern  . 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   She was home schooled.   She lives with her dad only. She has no siblings.   She enjoys playing with her dogs and being on her phone.   Social Determinants of Health   Financial Resource Strain:   . Difficulty of Paying Living Expenses: Not on file  Food Insecurity:   . Worried About Programme researcher, broadcasting/film/video in the Last Year: Not on file  . Ran Out of  Food in the Last Year: Not on file  Transportation Needs:   . Lack of Transportation (Medical): Not on file  . Lack of Transportation (Non-Medical): Not on file  Physical Activity:   . Days of Exercise per Week: Not on file  . Minutes of Exercise per Session: Not on file  Stress:   . Feeling of Stress : Not on file  Social Connections:   . Frequency of Communication with Friends and Family: Not on file  . Frequency of Social Gatherings with Friends and Family: Not on file  . Attends Religious Services: Not on file  . Active Member of Clubs or Organizations: Not on file  . Attends Banker Meetings: Not on file  . Marital Status: Not on file  Intimate Partner Violence:   . Fear of Current or Ex-Partner: Not on file  . Emotionally Abused: Not on file  . Physically Abused: Not on file  . Sexually Abused: Not on file   Family History  Problem Relation Age of Onset  . Healthy Mother   . Healthy Father     OBJECTIVE:  Vitals:   12/11/19 1134 12/11/19 1135  BP: 109/73   Pulse: 69   Resp: 18   Temp: 98.2 F (36.8 C)   TempSrc: Oral   SpO2: 98%   Weight:  150 lb (68 kg)  Height:  5\' 5"  (1.651 m)     General appearance: Alert, NAD, appears stated age Head: NCAT Throat: lips, mucosa, and tongue normal; teeth and gums normal Lungs: CTA bilaterally without adventitious breath sounds Heart: regular rate and rhythm.  Radial pulses 2+ symmetrical bilaterally Back: no CVA tenderness Abdomen: soft, non-tender; bowel sounds normal; no masses or organomegaly; no guarding or rebound tenderness GU:  Cervical self swab obtained Skin: warm and dry Psychological:  Alert and cooperative. Normal mood and affect.  LABS:  Results for orders placed or performed during the hospital encounter of 12/11/19  POCT urinalysis dipstick  Result Value Ref Range   Color, UA yellow yellow   Clarity, UA clear clear   Glucose, UA negative negative mg/dL   Bilirubin, UA small (A) negative    Ketones, POC UA trace (5) (A) negative mg/dL   Spec Grav, UA 02/10/20 (A) 1.010 - 1.025   Blood, UA negative negative   pH, UA 6.0 5.0 - 8.0   Protein Ur, POC =100 (A) negative mg/dL   Urobilinogen, UA 1.0 0.2 or 1.0 E.U./dL   Nitrite, UA Negative Negative   Leukocytes, UA Trace (A) Negative  POCT urine pregnancy  Result Value Ref Range   Preg Test, Ur Negative Negative    Labs Reviewed  POCT URINALYSIS DIP (MANUAL ENTRY) - Abnormal; Notable for the following components:      Result Value   Bilirubin, UA small (*)    Ketones, POC  UA trace (5) (*)    Spec Grav, UA >=1.030 (*)    Protein Ur, POC =100 (*)    Leukocytes, UA Trace (*)    All other components within normal limits  URINE CULTURE  POCT URINE PREGNANCY  CERVICOVAGINAL ANCILLARY ONLY    ASSESSMENT & PLAN:  1. Screening for STD (sexually transmitted disease)   2. Dysuria     Meds ordered this encounter  Medications  . metroNIDAZOLE (FLAGYL) 500 MG tablet    Sig: Take 1 tablet (500 mg total) by mouth 2 (two) times daily.    Dispense:  14 tablet    Refill:  0  . phenazopyridine (PYRIDIUM) 100 MG tablet    Sig: Take 1 tablet (100 mg total) by mouth 3 (three) times daily as needed for pain.    Dispense:  10 tablet    Refill:  0    Pending: Labs Reviewed  POCT URINALYSIS DIP (MANUAL ENTRY) - Abnormal; Notable for the following components:      Result Value   Bilirubin, UA small (*)    Ketones, POC UA trace (5) (*)    Spec Grav, UA >=1.030 (*)    Protein Ur, POC =100 (*)    Leukocytes, UA Trace (*)    All other components within normal limits  URINE CULTURE  POCT URINE PREGNANCY  CERVICOVAGINAL ANCILLARY ONLY   She has been treated for possible BV or Trichomonas.  Will await cervical ancillary test.   Discharge instructions   Prescribed metronidazole 500 mg twice daily for 7 days (do not take while consuming alcohol and/or if breastfeeding) Take medications as prescribed and to completion If tests  results are positive, please abstain from sexual activity until you and your partner(s) have been treated Follow up with PCP or [facility] if symptoms persists Return here or go to ER if you have any new or worsening symptoms fever, chills, nausea, vomiting, abdominal or pelvic pain, painful intercourse, vaginal discharge, vaginal bleeding, persistent symptoms despite treatment, etc...  Reviewed expectations re: course of current medical issues. Questions answered. Outlined signs and symptoms indicating need for more acute intervention. Patient verbalized understanding. After Visit Summary given.    Note: This document was prepared using Dragon voice recognition software and may include unintentional dictation errors.    Durward Parcel, FNP 12/11/19 1216

## 2019-12-12 LAB — URINE CULTURE: Special Requests: NORMAL

## 2019-12-13 LAB — CERVICOVAGINAL ANCILLARY ONLY
Bacterial Vaginitis (gardnerella): NEGATIVE
Candida Glabrata: NEGATIVE
Candida Vaginitis: POSITIVE — AB
Chlamydia: NEGATIVE
Comment: NEGATIVE
Comment: NEGATIVE
Comment: NEGATIVE
Comment: NEGATIVE
Comment: NEGATIVE
Comment: NORMAL
Neisseria Gonorrhea: NEGATIVE
Trichomonas: NEGATIVE

## 2019-12-13 NOTE — Telephone Encounter (Signed)
Called back to office for re collection of urine

## 2019-12-14 ENCOUNTER — Telehealth (HOSPITAL_COMMUNITY): Payer: Self-pay

## 2019-12-14 LAB — URINE CULTURE: Culture: 70000 — AB

## 2019-12-14 MED ORDER — FLUCONAZOLE 150 MG PO TABS
150.0000 mg | ORAL_TABLET | Freq: Every day | ORAL | 0 refills | Status: AC
Start: 2019-12-14 — End: 2019-12-16

## 2020-02-14 ENCOUNTER — Encounter (HOSPITAL_COMMUNITY): Payer: Self-pay

## 2020-02-14 ENCOUNTER — Other Ambulatory Visit: Payer: Self-pay

## 2020-02-14 ENCOUNTER — Ambulatory Visit (HOSPITAL_COMMUNITY): Payer: Self-pay

## 2020-02-14 ENCOUNTER — Emergency Department (HOSPITAL_COMMUNITY)
Admission: EM | Admit: 2020-02-14 | Discharge: 2020-02-14 | Disposition: A | Discharge status: Home / Self Care | Payer: 59 | Attending: Emergency Medicine | Admitting: Emergency Medicine

## 2020-02-14 DIAGNOSIS — R519 Headache, unspecified: Secondary | ICD-10-CM | POA: Insufficient documentation

## 2020-02-14 DIAGNOSIS — M62838 Other muscle spasm: Secondary | ICD-10-CM | POA: Diagnosis not present

## 2020-02-14 DIAGNOSIS — M542 Cervicalgia: Secondary | ICD-10-CM | POA: Diagnosis present

## 2020-02-14 MED ORDER — DIPHENHYDRAMINE HCL 50 MG/ML IJ SOLN
25.0000 mg | Freq: Once | INTRAMUSCULAR | Status: DC
  Filled 2020-02-14: qty 1

## 2020-02-14 MED ORDER — METOCLOPRAMIDE HCL 5 MG/ML IJ SOLN
10.0000 mg | Freq: Once | INTRAMUSCULAR | Status: DC
  Filled 2020-02-14: qty 2

## 2020-02-14 MED ORDER — CYCLOBENZAPRINE HCL 10 MG PO TABS
10.0000 mg | ORAL_TABLET | Freq: Two times a day (BID) | ORAL | 0 refills | Status: DC | PRN
Start: 2020-02-14 — End: 2020-06-12

## 2020-02-14 MED ORDER — CYCLOBENZAPRINE HCL 10 MG PO TABS
10.0000 mg | ORAL_TABLET | Freq: Once | ORAL | Status: AC
  Administered 2020-02-14: 10 mg via ORAL
  Filled 2020-02-14: qty 1

## 2020-02-14 MED ORDER — SODIUM CHLORIDE 0.9 % IV BOLUS: 1000.0000 mL | Freq: Once | INTRAVENOUS | Status: DC

## 2020-02-14 NOTE — ED Notes (Signed)
Pt in bed, pt states that she has a stiff neck and headache, states that she doesn't want medicine for her headache, states that she would like something for her neck and to go home and take her headache medicine.  PA notified, of pt's request for po flexeril, meds given, pt expressed thanks for care received, pt verbalized understanding d/c instructions, script given, pt ambulatory from dpt with associate.

## 2020-02-14 NOTE — ED Triage Notes (Signed)
Pt presents to ED with complaints of left sided neck pain started Thursday morning. Pt states she felt she slept wrong on her neck but has progressively gotten worse and now has a migraine, nausea and vomiting.

## 2020-02-14 NOTE — Discharge Instructions (Signed)
Please read instructions below. Stay hydrated. You can take your prescribed medications as directed for headache. Schedule an appointment with your primary care provider to follow up on your neck pain.  Return to the ER for severely worsening headache, vision changes, fever, weakness or numbness, or new or concerning symptoms.

## 2020-02-14 NOTE — ED Provider Notes (Signed)
[facility] EMERGENCY DEPARTMENT Provider Note   CSN: [id number] Arrival date & time: 02/14/20  1745     History Chief Complaint  Patient presents with   Neck Pain    Hannah Walker is a 19 y.o. female past medical history of migraine headache, tension headache, presenting to the emergency department with complaint of left-sided neck pain that began Thursday.  She states she woke up on Thursday morning and felt as though she slept wrong.  She had soreness to the left neck that has progressively worsened.  It is worse with movement and palpation.  It is to her left neck and radiates down towards her shoulder.  It is also worse with lifting her arm.  It is causing her to have a headache that began today.  Her headache feels typical of her chronic recurrent migraine headache.  She reports some nausea with it and a few episodes of emesis earlier today though that part has resolved.  She also has photophobia.  She has treated her neck pain with Aleve and ibuprofen.  She has not treated her headache yet, though she is prescribed Imitrex and usually takes this for headache.  She mostly presents today for neck pain.  The history is provided by the patient.       Past Medical History:  Diagnosis Date   Anxiety    Headache    PCO (polycystic ovaries)    Renal disorder    kidney stones    Patient Active Problem List   Diagnosis Date Noted   Tired 06/03/2019   History of ovarian cyst 06/03/2019   Nexplanon in place 06/03/2019   LLQ pain 06/03/2019   Irregular bleeding 06/03/2019   Pelvic pain 06/03/2019   Episodic tension-type headache, not intractable 01/07/2018   Migraine without aura and without status migrainosus, not intractable 07/23/2017   New daily persistent headache 02/27/2017   Concussion without loss of consciousness 02/27/2017    Past Surgical History:  Procedure Laterality Date   NO PAST SURGERIES     TENDON REPAIR Right 05/15/2018   Procedure:  EXTENSOR TENDON REPAIR RIGHT THUMB;  Surgeon: Knute Neu, MD;  Location: [facility] OR;  Service: Plastics;  Laterality: Right;     OB History    Gravida  0   Para  0   Term  0   Preterm  0   AB  0   Living  0     SAB  0   TAB  0   Ectopic  0   Multiple  0   Live Births  0           Family History  Problem Relation Age of Onset   Healthy Mother    Healthy Father     Social History   Tobacco Use   Smoking status: Never Smoker   Smokeless tobacco: Never Used  Building services engineer Use: Never used  Substance Use Topics   Alcohol use: No   Drug use: No    Home Medications Prior to Admission medications   Medication Sig Start Date End Date Taking? Authorizing Provider  azithromycin (ZITHROMAX) 250 MG tablet Take 1 tablet (250 mg total) by mouth daily. Take first 2 tablets together, then 1 every day until finished. 11/18/19   Avegno, Zachery Dakins, FNP  benzonatate (TESSALON) 100 MG capsule Take 1 capsule (100 mg total) by mouth every 8 (eight) hours. 11/16/19   Wurst, [place], PA-C  cyclobenzaprine (FLEXERIL) 10 MG tablet Take 1  tablet (10 mg total) by mouth 2 (two) times daily as needed for muscle spasms. 02/14/20   Jolon Degante, [place] N, PA-C  metroNIDAZOLE (FLAGYL) 500 MG tablet Take 1 tablet (500 mg total) by mouth 2 (two) times daily. 12/11/19   Avegno, Zachery Dakins, FNP  ondansetron (ZOFRAN) 4 MG tablet Take 1 tablet as needed for nausea, no more than once daily 10/22/17   Deetta Perla, MD  ondansetron (ZOFRAN-ODT) 8 MG disintegrating tablet Take 1 tablet (8 mg total) by mouth every 8 (eight) hours as needed for nausea or vomiting. 06/03/19   Adline Potter, NP  phenazopyridine (PYRIDIUM) 100 MG tablet Take 1 tablet (100 mg total) by mouth 3 (three) times daily as needed for pain. 12/11/19   Avegno, Zachery Dakins, FNP  rizatriptan (MAXALT-MLT) 10 MG disintegrating tablet TAKE 1 TABLET BY MOUTH UNDER YOUR TONGUE WITH NONSTEROIDAL MEDICATION AND ONSET OF  MIGRAINE MAY REPEAT AN ADDITIONAL TABLET IN 2 HOURS IF NEEDED 06/02/19   Deetta Perla, MD  ESTARYLLA 0.25-35 MG-MCG tablet TK 1 T PO D 04/08/18 02/18/19  [provider]  etonogestrel (NEXPLANON) 68 MG IMPL implant Nexplanon 68 mg subdermal implant  Inject 1 implant by subcutaneous route.  11/16/19  [provider]  topiramate (TOPAMAX) 25 MG tablet TAKE 4 TABLETS BY MOUTH AT NIGHT TIME 11/17/18 02/18/19  Deetta Perla, MD    Allergies    Ampicillin  Review of Systems   Review of Systems  All other systems reviewed and are negative.   Physical Exam Updated Vital Signs BP 106/90 (BP Location: Left Arm)    Pulse 88    Temp 98.1 F (36.7 C) (Oral)    Resp 18    Ht 5\' 5"  (1.651 m)    Wt 68 kg    LMP 12/23/2019    SpO2 100%    BMI 24.96 kg/m   Physical Exam Vitals and nursing note reviewed.  Constitutional:      General: She is not in acute distress.    Appearance: She is well-developed. She is not ill-appearing.  HENT:     Head: Normocephalic and atraumatic.  Eyes:     Conjunctiva/sclera: Conjunctivae normal.  Neck:     Comments: TTP to left paracervical musculature extending down through trapezius muscle group. Normal ROM of the neck though this does cause pain to left neck. No midline spinal TTP or gross deformity. No skin changes. Cardiovascular:     Rate and Rhythm: Normal rate and regular rhythm.  Pulmonary:     Effort: Pulmonary effort is normal. No respiratory distress.     Breath sounds: Normal breath sounds.  Abdominal:     Palpations: Abdomen is soft.  Musculoskeletal:     Cervical back: Normal range of motion and neck supple.  Skin:    General: Skin is warm.  Neurological:     Mental Status: She is alert.     Comments: Mental Status:  Alert, oriented, thought content appropriate, able to give a coherent history. Speech fluent without evidence of aphasia. Able to follow 2 step commands without difficulty.  Cranial Nerves grossly normal. Nl  EOM, PERRL Motor:  Normal tone. 5/5 strength in BUE and BLE with strong and equal grip strength and dorsiflexion/plantar flexion Sensory: grossly normal in all extremities.  CV: distal pulses palpable throughout    Psychiatric:        Behavior: Behavior normal.     ED Results / Procedures / Treatments   Labs (all labs ordered  are listed, but only abnormal results are displayed) Labs Reviewed - No data to display  EKG None  Radiology No results found.  Procedures Procedures (including critical care time)  Medications Ordered in ED Medications  cyclobenzaprine (FLEXERIL) tablet 10 mg (10 mg Oral Given 02/14/20 1936)    ED Course  I have reviewed the triage vital signs and the nursing notes.  Pertinent labs & imaging results that were available during my care of the patient were reviewed by me and considered in my medical decision making (see chart for details).  Clinical Course as of Feb 14 1943  Mon Feb 14, 2020  1935 Initially planned to give migraine cocktail per request of patient, however she changed her mind and would prefer PO muscle relaxer and discharge to take her imitrex at home.    [JR]    Clinical Course User Index [JR] Charels Stambaugh, [place] N, PA-C   MDM Rules/Calculators/A&P                          Patient presenting to the ED for muscle spasm to the left neck that began on Thursday.  She states she woke up that morning and felt as though she slept wrong.  She has been having pain that is worse with movement to the left neck extending into the shoulder since that time.  No neuro symptoms or abnormal findings on exam.  No indication for advanced imaging.  She does have history of chronic headache, and began having typical headache today.  Patient is prescribed Imitrex for her HAs, however states this is at her home, and she came directly to the ED from work.  On exam, she is well-appearing and in no distress.  Presentation is likely due to muscle spasm which is  palpable on exam.  Recommend symptomatic management, including heat, gentle massage/stretches, additionally will prescribe muscle relaxer.  Offered headache treatment in the ED, initially patient preferred IV migraine cocktail, however changed her mind and would prefer p.o. dose of muscle relaxant here, and discharged to home where she can take her prescribed Imitrex.  Patient discharged in no acute distress.  Outpatient follow-up recommended.   Final Clinical Impression(s) / ED Diagnoses Final diagnoses:  Muscle spasms of neck  Bad headache    Rx / DC Orders ED Discharge Orders         Ordered    cyclobenzaprine (FLEXERIL) 10 MG tablet  2 times daily PRN        02/14/20 1930           Buddie Marston, [place] N, PA-C 02/14/20 1944    Gerhard Munch, MD 02/14/20 2319

## 2020-03-17 ENCOUNTER — Ambulatory Visit (INDEPENDENT_AMBULATORY_CARE_PROVIDER_SITE_OTHER): Payer: Managed Care, Other (non HMO) | Admitting: Pediatrics

## 2020-06-12 ENCOUNTER — Encounter (INDEPENDENT_AMBULATORY_CARE_PROVIDER_SITE_OTHER): Payer: Self-pay

## 2020-06-12 ENCOUNTER — Encounter (INDEPENDENT_AMBULATORY_CARE_PROVIDER_SITE_OTHER): Payer: Self-pay | Admitting: Pediatrics

## 2020-06-12 ENCOUNTER — Other Ambulatory Visit: Payer: Self-pay

## 2020-06-12 ENCOUNTER — Ambulatory Visit (INDEPENDENT_AMBULATORY_CARE_PROVIDER_SITE_OTHER): Payer: PRIVATE HEALTH INSURANCE | Admitting: Pediatrics

## 2020-06-12 VITALS — BP 100/68 | HR 88 | Ht 63.5 in | Wt 148.2 lb

## 2020-06-12 DIAGNOSIS — M542 Cervicalgia: Secondary | ICD-10-CM | POA: Diagnosis not present

## 2020-06-12 DIAGNOSIS — G43009 Migraine without aura, not intractable, without status migrainosus: Secondary | ICD-10-CM

## 2020-06-12 DIAGNOSIS — G44219 Episodic tension-type headache, not intractable: Secondary | ICD-10-CM | POA: Diagnosis not present

## 2020-06-12 HISTORY — DX: Cervicalgia: M54.2

## 2020-06-12 MED ORDER — ONDANSETRON HCL 4 MG PO TABS: 4.0000 mg | ORAL_TABLET | Freq: Three times a day (TID) | ORAL | 5 refills | Status: DC | PRN

## 2020-06-12 MED ORDER — NURTEC 75 MG PO TBDP: ORAL_TABLET | ORAL | 5 refills | Status: DC

## 2020-06-12 NOTE — Progress Notes (Signed)
Patient: Hannah Walker MRN: [id number] Sex: female DOB: 01/07/2001  Provider: Ellison Carwin, MD Location of Care: [facility]  Note type: Routine return visit  History of Present Illness: Referral Source: Suzanna Obey, MD History from: patient and [facility] chart Chief Complaint: Headaches  Hannah Walker is a 20 y.o. female who was evaluated June 12, 2020 for the first time since July 23, 2018.  At that time she had new daily persistent headaches that were associated with tension type and migraines.  Amazingly, the frequency and severity of her headaches declined considerably after this time which is why she was lost to follow-up.  She developed severe neck pain and presented to the emergency department initially February 14, 2020.  The pain radiated down into her shoulder, was exacerbated by lifting her arm and seemed to trigger a severe headache that became migrainous with nausea vomiting and sensitivity to light.  Over-the-counter nonsteroidal medications did not provide any relief.  She had tenderness and decreased range of motion in her neck.  She had a normal neurologic examination.  A diagnosis of muscle spasms was made.  She was treated with cyclobenzaprine.  Her symptoms worsened in January.  She now experiences headaches every day and has migraines 4 days a week.  Migraines are associated with blurred vision nausea and vomiting.  She is not taking preventative medication.  She is taking Flexeril, and ondansetron for her nausea she is also taking Excedrin more than once in a day.  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  Whether not this is enough to provide evidence for her neck pain is unclear.  Her headaches involve both sides of her scalp and the back of her neck.  She experiences nausea vomiting and blurred vision.  She has a mixture of  chronic tension type headaches and migraine without aura unless the blurred vision is considered to be an aura.  What is clear based on today's examination is that she has significant decreased range of motion in virtually all directions.  She definitely benefit from aggressive physical therapy to try to increase range of motion and decrease the spasm in her neck.  She also would benefit from symptomatic treatment of her migraines with an abortive CGRP inhibitor like Nurtec and ondansetron for nausea.  She is try rizatriptan which was last prescribed by me and it has not helped.  She is also takes Lexapro for anxiety and depression and hydroxyzine to help her try to sleep.  She wears NuvaRing as contraception.  She somehow is able to work with her severe headaches.  She is had Covid in April 2021 for shortly after she was vaccinated, November 2021 in February 2022.  Review of Systems: A complete review of systems was remarkable for patient is here to be seen for a follow up. She reports that she has started tohave severe neck pain since January. She states that since then she has had a headache every day. She reports four out of the seven days are migraines. She reports that she has vomiting and blurry vision. She has no other concerns at thist time., all other systems reviewed and negative.  Past Medical History Diagnosis Date  . Anxiety   . Headache   . PCO (polycystic ovaries)   . Renal disorder    kidney stones   Hospitalizations: No., Head Injury: No.,  Nervous System Infections: No., Immunizations up to date: Yes.    CT cervical spine August 31, 2016:1. No evidence of fracture or subluxation along the cervical spine. 2. Underlying Klippel-Feil deformity, with fusion of C2 and C3, and slight developmental asymmetry of C3  Birth History 6 Lbs.7oz. infant born at [redacted]weeks gestational age to a 22year old g 1p 13female. Gestation wascomplicated bypreterm labor Normal spontaneous  vaginal delivery Nursery Course wascomplicated bypremature lungs requiring surfactant Growth and Development wasrecalled asnormal  Behavior History none  Surgical History Procedure Laterality Date  . NO PAST SURGERIES    . TENDON REPAIR Right 05/15/2018   Procedure: EXTENSOR TENDON REPAIR RIGHT THUMB;  Surgeon: Knute Neu, MD;  Location: [facility] OR;  Service: Plastics;  Laterality: Right;   Family History family history includes Healthy in her father and mother. Family history is negative for migraines, seizures, intellectual disabilities, blindness, deafness, birth defects, chromosomal disorder, or autism.  Social History  Socioeconomic History  . Marital status: Single    Spouse name:  Joselyn Glassman  . Number of children:  Joselyn Glassman has 1 child by another marriage  . Years of education:  42  . Highest education level:  1 year community college  Occupational History  . Not on file  Tobacco Use  . Smoking status: Never Smoker  . Smokeless tobacco: Never Used  Vaping Use  . Vaping Use: Never used  Substance and Sexual Activity  . Alcohol use: No  . Drug use: No  . 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    She was home schooled.    She lives with her dad only. She has no siblings.    She enjoys playing with her dogs and being on her phone.   Allergies Allergen Reactions  . Ampicillin Rash    Has patient had a PCN reaction causing immediate rash, facial/tongue/throat swelling, SOB or lightheadedness with hypotension: Yes Has patient had a PCN reaction causing severe rash involving mucus membranes or skin necrosis: No Has patient had a PCN reaction that required hospitalization: No Has patient had a PCN reaction occurring within the last 10 years: Yes If all of the above answers are "NO", then may proceed with Cephalosporin use.    Physical Exam BP 100/68   Pulse 88   Ht 5' 3.5" (1.613 m)    Wt 148 lb 3.2 oz (67.2 kg)   BMI 25.84 kg/m   General: alert, well developed, well nourished, in no acute distress, brown hair, brown eyes, right handed Head: normocephalic, no dysmorphic features Ears, Nose and Throat: Otoscopic: tympanic membranes normal; pharynx: oropharynx is pink without exudates or tonsillar hypertrophy Neck: Tender, decreased range of motion in all directions, no cranial or cervical bruits Respiratory: auscultation clear Cardiovascular: no murmurs, pulses are normal Musculoskeletal: no skeletal deformities or apparent scoliosis Skin: no rashes or neurocutaneous lesions  Neurologic Exam  Mental Status: alert; oriented to person, place and year; knowledge is normal for age; language is normal Cranial Nerves: visual fields are full to double simultaneous stimuli; extraocular movements are full and conjugate; pupils are round reactive to light; funduscopic examination shows sharp disc margins with normal vessels; symmetric facial strength; midline tongue and uvula; air conduction is greater than bone conduction bilaterally Motor: Normal strength, tone and mass; good fine motor movements; no pronator drift Sensory: intact responses to cold, vibration, proprioception and stereognosis Coordination: good finger-to-nose, rapid repetitive alternating movements and  finger apposition Gait and Station: normal gait and station: patient is able to walk on heels, toes and tandem without difficulty; balance is adequate; Romberg exam is negative; Gower response is negative Reflexes: symmetric and diminished bilaterally; no clonus; bilateral flexor plantar responses  Assessment 1. Migraine without aura and without status migrainosus, not intractable, G43.009. 2.   Episodic tension-type headache, not intractable, G44.219. 3.   Bilateral posterior neck pain, M54.2.   Discussion I believe that the neck pain is musculoskeletal and mechanical.  Some of her symptoms may be related to  her cervical spine abnormalities.  Plan I would refer her to integrative therapies to see if we can get a multidisciplinary physical therapy approach to her neck to decrease spasm tenderness trigger points and increased range of motion. I will order Nurtec as an abortive migraine medicine We will give her ondansetron for her nausea.  She will return to see me in 3 months I will see her sooner based on clinical need.  She will keep a daily prospective headache calendar and send it to me. We may consider preventative CGRP inhibitors if her headaches do not improve as we improve her neck pain.  Greater than 50% of a 40-minute visit was spent counseling coordination of care and discussing long-term care with an adult neurologist.  I am going to see her through this acute crisis. Allergies as of 06/12/2020      Reactions   Ampicillin Rash   Has patient had a PCN reaction causing immediate rash, facial/tongue/throat swelling, SOB or lightheadedness with hypotension: Yes Has patient had a PCN reaction causing severe rash involving mucus membranes or skin necrosis: No Has patient had a PCN reaction that required hospitalization: No Has patient had a PCN reaction occurring within the last 10 years: Yes If all of the above answers are "NO", then may proceed with Cephalosporin use.      Medication List       Accurate as of June 12, 2020  9:03 AM. If you have any questions, ask your nurse or doctor.        azithromycin 250 MG tablet Commonly known as: ZITHROMAX Take 1 tablet (250 mg total) by mouth daily. Take first 2 tablets together, then 1 every day until finished.   benzonatate 100 MG capsule Commonly known as: TESSALON Take 1 capsule (100 mg total) by mouth every 8 (eight) hours.   cyclobenzaprine 10 MG tablet Commonly known as: FLEXERIL Take 1 tablet (10 mg total) by mouth 2 (two) times daily as needed for muscle spasms.   escitalopram 20 MG tablet Commonly known as: LEXAPRO Take 20  mg by mouth daily.   etonogestrel-ethinyl estradiol 0.12-0.015 MG/24HR vaginal ring Commonly known as: NUVARING Place vaginally.   hydrOXYzine 25 MG tablet Commonly known as: ATARAX/VISTARIL Take 25 mg by mouth every 8 (eight) hours as needed.   metroNIDAZOLE 500 MG tablet Commonly known as: FLAGYL Take 1 tablet (500 mg total) by mouth 2 (two) times daily.   ondansetron 4 MG tablet Commonly known as: ZOFRAN Take 1 tablet as needed for nausea, no more than once daily   ondansetron 8 MG disintegrating tablet Commonly known as: ZOFRAN-ODT Take 1 tablet (8 mg total) by mouth every 8 (eight) hours as needed for nausea or vomiting.   phenazopyridine 100 MG tablet Commonly known as: Pyridium Take 1 tablet (100 mg total) by mouth 3 (three) times daily as needed for pain.   rizatriptan 10 MG disintegrating tablet Commonly known as: MAXALT-MLT TAKE  1 TABLET BY MOUTH UNDER YOUR TONGUE WITH NONSTEROIDAL MEDICATION AND ONSET OF MIGRAINE MAY REPEAT AN ADDITIONAL TABLET IN 2 HOURS IF NEEDED       The medication list was reviewed and reconciled. All changes or newly prescribed medications were explained.  A complete medication list was provided to the patient/caregiver.  Deetta PerlaWilliam H Hickling MD

## 2020-06-12 NOTE — Patient Instructions (Signed)
It was a pleasure to see you today.  I am sorry that you are having neck pain which I think is leading to migraines.  We will going to attack this problem with therapy first and with abortive migraine medications.  Please keep in touch with me through MyChart.  I will see you in 3 months but may need to see you sooner.  Bring over the MRI scan CD-ROM when you have a chance.

## 2020-06-14 ENCOUNTER — Telehealth (INDEPENDENT_AMBULATORY_CARE_PROVIDER_SITE_OTHER): Payer: Self-pay | Admitting: Pediatrics

## 2020-06-14 DIAGNOSIS — G43009 Migraine without aura, not intractable, without status migrainosus: Secondary | ICD-10-CM

## 2020-06-14 NOTE — Telephone Encounter (Signed)
Tiffanie can you help me with both of these things?

## 2020-06-14 NOTE — Telephone Encounter (Signed)
Referral has been sent as of this morning and her insurance denied the medication. It does not cover it

## 2020-06-14 NOTE — Telephone Encounter (Signed)
Noted  

## 2020-06-14 NOTE — Telephone Encounter (Signed)
I updated Tarynn, Please call after PA is a completed.

## 2020-06-14 NOTE — Telephone Encounter (Signed)
  Who's calling (name and relationship to patient) :  Best contact number:  Provider they see:  Reason for call: Patient called this morning regarding two things The referral is in our system for the physical therapy discussed but the physical therapy Dept is telling patient they have not received that fax so they can get her scheduled Patient also asking about her medication prior authorization      PRESCRIPTION REFILL ONLY  Name of prescription:  Pharmacy: [facility]  [address]

## 2020-06-15 MED ORDER — NURTEC 75 MG PO TBDP: ORAL_TABLET | ORAL | 5 refills | Status: DC

## 2020-06-15 NOTE — Telephone Encounter (Signed)
Thank you :)

## 2020-06-15 NOTE — Telephone Encounter (Signed)
I called [facility]. They approved Nurtec ODT for quantity of 8 tablets every 30 days. This will be effective until June 15, 2021.  I sent in updated Rx for quantity of 8 tablets per month.  There is a coupon online at http://[email or web address]-[email or web address]/. This reduces the copay to $0 for most patients.   Tiffanie, please let Brit know. Thanks, Inetta Fermo

## 2020-06-15 NOTE — Telephone Encounter (Signed)
Spoke with patient to inform her that the Nurtec has been approved

## 2020-06-26 ENCOUNTER — Encounter: Payer: Self-pay | Admitting: Emergency Medicine

## 2020-06-26 ENCOUNTER — Ambulatory Visit
Admission: EM | Admit: 2020-06-26 | Discharge: 2020-06-26 | Disposition: A | Discharge status: Home / Self Care | Payer: PRIVATE HEALTH INSURANCE | Attending: Family Medicine | Admitting: Family Medicine

## 2020-06-26 ENCOUNTER — Ambulatory Visit (INDEPENDENT_AMBULATORY_CARE_PROVIDER_SITE_OTHER): Payer: PRIVATE HEALTH INSURANCE

## 2020-06-26 DIAGNOSIS — S96912A Strain of unspecified muscle and tendon at ankle and foot level, left foot, initial encounter: Secondary | ICD-10-CM

## 2020-06-26 DIAGNOSIS — W19XXXA Unspecified fall, initial encounter: Secondary | ICD-10-CM | POA: Diagnosis not present

## 2020-06-26 DIAGNOSIS — M25572 Pain in left ankle and joints of left foot: Secondary | ICD-10-CM

## 2020-06-26 NOTE — ED Provider Notes (Signed)
[facility]    CSN: [id number] Arrival date & time: 06/26/20  1930      History   Chief Complaint Chief Complaint  Patient presents with  . Ankle Pain    HPI Hannah Walker is a 20 y.o. female.   HPI   Patient reports experiencing an injury to left ankle in which she twisted her ankle and felt a pop. She has had pain with weight bearing since injury occurred. No prior injuries involving left ankle. Denies any numbness or tingling of toes or left foot. Past Medical History:  Diagnosis Date  . Anxiety   . Headache   . PCO (polycystic ovaries)   . Renal disorder    kidney stones    Patient Active Problem List   Diagnosis Date Noted  . Bilateral posterior neck pain 06/12/2020  . Tired 06/03/2019  . History of ovarian cyst 06/03/2019  . Nexplanon in place 06/03/2019  . LLQ pain 06/03/2019  . Irregular bleeding 06/03/2019  . Pelvic pain 06/03/2019  . Episodic tension-type headache, not intractable 01/07/2018  . Migraine without aura and without status migrainosus, not intractable 07/23/2017  . New daily persistent headache 02/27/2017  . Concussion without loss of consciousness 02/27/2017    Past Surgical History:  Procedure Laterality Date  . NO PAST SURGERIES    . TENDON REPAIR Right 05/15/2018   Procedure: EXTENSOR TENDON REPAIR RIGHT THUMB;  Surgeon: Knute Neu, MD;  Location: [facility] OR;  Service: Plastics;  Laterality: Right;    OB History    Gravida  0   Para  0   Term  0   Preterm  0   AB  0   Living  0     SAB  0   IAB  0   Ectopic  0   Multiple  0   Live Births  0            Home Medications    Prior to Admission medications   Medication Sig Start Date End Date Taking? Authorizing Provider  escitalopram (LEXAPRO) 20 MG tablet Take 20 mg by mouth daily. 05/08/20   [provider]  etonogestrel-ethinyl estradiol (NUVARING) 0.12-0.015 MG/24HR vaginal ring Place vaginally. 05/25/20   [provider]   hydrOXYzine (ATARAX/VISTARIL) 25 MG tablet Take 25 mg by mouth every 8 (eight) hours as needed. 04/11/20   [provider]  NURTEC 75 MG TBDP Take 1 tablet at onset of migraine 06/15/20   Elveria Rising, NP  ondansetron (ZOFRAN) 4 MG tablet Take 1 tablet (4 mg total) by mouth every 8 (eight) hours as needed for nausea or vomiting. 06/12/20   Deetta Perla, MD  ESTARYLLA 0.25-35 MG-MCG tablet TK 1 T PO D 04/08/18 02/18/19  [provider]  etonogestrel (NEXPLANON) 68 MG IMPL implant Nexplanon 68 mg subdermal implant  Inject 1 implant by subcutaneous route.  11/16/19  [provider]  topiramate (TOPAMAX) 25 MG tablet TAKE 4 TABLETS BY MOUTH AT NIGHT TIME 11/17/18 02/18/19  Deetta Perla, MD    Family History Family History  Problem Relation Age of Onset  . Healthy Mother   . Healthy Father     Social History Social History   Tobacco Use  . Smoking status: Never Smoker  . Smokeless tobacco: Never Used  Vaping Use  . Vaping Use: Never used  Substance Use Topics  . Alcohol use: No  . Drug use: No     Allergies   Ampicillin  Review of Systems Review of Systems Pertinent negatives listed in HPI Physical Exam Triage Vital Signs ED Triage Vitals [06/26/20 1939]  Enc Vitals Group     BP 104/66     Pulse Rate 100     Resp 18     Temp 98.5 F (36.9 C)     Temp Source Oral     SpO2 98 %     Weight      Height      Head Circumference      Peak Flow      Pain Score 5     Pain Loc      Pain Edu?      Excl. in GC?    No data found.  Updated Vital Signs BP 104/66 (BP Location: Right Arm)   Pulse 100   Temp 98.5 F (36.9 C) (Oral)   Resp 18   SpO2 98%   Visual Acuity Right Eye Distance:   Left Eye Distance:   Bilateral Distance:    Right Eye Near:   Left Eye Near:    Bilateral Near:     Physical Exam Vitals and nursing note reviewed.  Constitutional:      Appearance: Normal appearance.  HENT:     Head: Normocephalic.      Nose: Nose normal.  Cardiovascular:     Rate and Rhythm: Normal rate and regular rhythm.     Pulses: Normal pulses.     Heart sounds: Normal heart sounds.  Pulmonary:     Effort: Pulmonary effort is normal.  Musculoskeletal:        General: Normal range of motion.  Skin:    General: Skin is warm.     Capillary Refill: Capillary refill takes less than 2 seconds.  Neurological:     General: No focal deficit present.     Mental Status: She is alert.  Psychiatric:        Mood and Affect: Mood normal.        Thought Content: Thought content normal.        Judgment: Judgment normal.      UC Treatments / Results  Labs (all labs ordered are listed, but only abnormal results are displayed) Labs Reviewed - No data to display  EKG   Radiology DG Ankle Complete Left  Result Date: 06/26/2020 CLINICAL DATA:  Fall with ankle pain. EXAM: LEFT ANKLE COMPLETE - 3+ VIEW COMPARISON:  December 17, 2012 FINDINGS: There is no evidence of fracture or dislocation. Small joint effusion. There is no evidence of arthropathy or other focal bone abnormality. Soft tissues are unremarkable. IMPRESSION: No fracture or dislocation of the left ankle. Small joint effusion. Electronically Signed   By: Maudry Mayhew MD   On: 06/26/2020 19:51    Procedures Procedures (including critical care time)  Medications Ordered in UC Medications - No data to display  Initial Impression / Assessment and Plan / UC Course  I have reviewed the triage vital signs and the nursing notes.  Pertinent labs & imaging results that were available during my care of the patient were reviewed by me and considered in my medical decision making (see chart for details).     Left ankle strain. Apply cam boot. Patient declined crutches. Naproxen or Tylenol  for pain and inflammation. Follow-up with [facility] if pain persist Final Clinical Impressions(s) / UC Diagnoses   Final diagnoses:  Left ankle strain, initial  encounter   Discharge Instructions   None  ED Prescriptions    None     PDMP not reviewed this encounter.   Bing Neighbors, FNP 06/28/20 2227

## 2020-06-26 NOTE — ED Triage Notes (Signed)
Twisted LT ankle today and heard a pop

## 2020-07-11 ENCOUNTER — Other Ambulatory Visit: Payer: Self-pay

## 2020-07-11 ENCOUNTER — Ambulatory Visit
Admission: RE | Admit: 2020-07-11 | Discharge: 2020-07-11 | Disposition: A | Discharge status: Home / Self Care | Payer: PRIVATE HEALTH INSURANCE | Source: Ambulatory Visit | Attending: Emergency Medicine | Admitting: Emergency Medicine

## 2020-07-11 VITALS — BP 119/79 | HR 109 | Temp 98.7°F | Resp 18

## 2020-07-11 DIAGNOSIS — R319 Hematuria, unspecified: Secondary | ICD-10-CM | POA: Diagnosis present

## 2020-07-11 DIAGNOSIS — N898 Other specified noninflammatory disorders of vagina: Secondary | ICD-10-CM

## 2020-07-11 LAB — POCT URINALYSIS DIP (MANUAL ENTRY)
Bilirubin, UA: NEGATIVE
Glucose, UA: NEGATIVE mg/dL
Ketones, POC UA: NEGATIVE mg/dL
Leukocytes, UA: NEGATIVE
Nitrite, UA: NEGATIVE
Protein Ur, POC: 30 mg/dL — AB
Spec Grav, UA: >=1.03 — AB (ref 1.010–1.025)
Urobilinogen, UA: 1 E.U./dL
pH, UA: 6.5 (ref 5.0–8.0)

## 2020-07-11 LAB — POCT URINE PREGNANCY: Preg Test, Ur: NEGATIVE

## 2020-07-11 MED ORDER — NITROFURANTOIN MONOHYD MACRO 100 MG PO CAPS: 100.0000 mg | ORAL_CAPSULE | Freq: Two times a day (BID) | ORAL | 0 refills | Status: DC

## 2020-07-11 NOTE — ED Provider Notes (Signed)
[facility] CARE CENTER   [id number] 07/11/20 Arrival Time: 1855   PN:[id number] DISCHARGE  SUBJECTIVE:  Hannah Walker is a 20 y.o. female who presents with complaints of vaginal discharge, vaginal/ urinary odor, and blood in urine x 2 week.  She denies a precipitating event, recent sexual encounter or recent antibiotic use.  She has tried OTC medications and left over antibiotic without relief.  She denies aggravating factors.  Denies similar symptoms.  She denies fever, chills, nausea, vomiting, abdominal or pelvic pain, vaginal itching, dyspareunia, vaginal rashes or lesions.   No LMP recorded. (Menstrual status: Irregular Periods).  ROS: As per HPI.  All other pertinent ROS negative.     Past Medical History:  Diagnosis Date  . Anxiety   . Headache   . PCO (polycystic ovaries)   . Renal disorder    kidney stones   Past Surgical History:  Procedure Laterality Date  . NO PAST SURGERIES    . TENDON REPAIR Right 05/15/2018   Procedure: EXTENSOR TENDON REPAIR RIGHT THUMB;  Surgeon: Knute Neu, MD;  Location: [facility] OR;  Service: Plastics;  Laterality: Right;   Allergies  Allergen Reactions  . Ampicillin Rash    Has patient had a PCN reaction causing immediate rash, facial/tongue/throat swelling, SOB or lightheadedness with hypotension: Yes Has patient had a PCN reaction causing severe rash involving mucus membranes or skin necrosis: No Has patient had a PCN reaction that required hospitalization: No Has patient had a PCN reaction occurring within the last 10 years: Yes If all of the above answers are "NO", then may proceed with Cephalosporin use.    No current facility-administered medications on file prior to encounter.   Current Outpatient Medications on File Prior to Encounter  Medication Sig Dispense Refill  . cyclobenzaprine (FLEXERIL) 10 MG tablet Take 10 mg by mouth 3 (three) times daily as needed for muscle spasms.    . meloxicam (MOBIC) 7.5 MG tablet Take 7.5 mg by  mouth in the morning and at bedtime.    [name] escitalopram (LEXAPRO) 20 MG tablet Take 20 mg by mouth daily.    [name] etonogestrel-ethinyl estradiol (NUVARING) 0.12-0.015 MG/24HR vaginal ring Place vaginally.    . hydrOXYzine (ATARAX/VISTARIL) 25 MG tablet Take 25 mg by mouth every 8 (eight) hours as needed.    . NURTEC 75 MG TBDP Take 1 tablet at onset of migraine 8 tablet 5  . ondansetron (ZOFRAN) 4 MG tablet Take 1 tablet (4 mg total) by mouth every 8 (eight) hours as needed for nausea or vomiting. 20 tablet 5  . [DISCONTINUED] ESTARYLLA 0.25-35 MG-MCG tablet TK 1 T PO D    . [DISCONTINUED] etonogestrel (NEXPLANON) 68 MG IMPL implant Nexplanon 68 mg subdermal implant  Inject 1 implant by subcutaneous route.    . [DISCONTINUED] topiramate (TOPAMAX) 25 MG tablet TAKE 4 TABLETS BY MOUTH AT NIGHT TIME 124 tablet 0    Social History   Socioeconomic History  . Marital status: Single    Spouse name: Not on file  . Number of children: Not on file  . Years of education: Not on file  . Highest education level: Not on file  Occupational History  . Not on file  Tobacco Use  . Smoking status: Never Smoker  . Smokeless tobacco: Never Used  Vaping Use  . Vaping Use: Never used  Substance and Sexual Activity  . Alcohol use: No  . Drug use: No  . Sexual activity: Yes    Birth control/protection: Implant  Other  Topics Concern  . 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   She was home schooled.   She lives with her dad only. She has no siblings.   She enjoys playing with her dogs and being on her phone.   Social Determinants of Health   Financial Resource Strain: Not on file  Food Insecurity: Not on file  Transportation Needs: Not on file  Physical Activity: Not on file  Stress: Not on file  Social Connections: Not on file  Intimate Partner Violence: Not on file   Family History  Problem Relation Age of Onset  . Healthy Mother   . Healthy Father      OBJECTIVE:  Vitals:   07/11/20 1906  BP: 119/79  Pulse: (!) 109  Resp: 18  Temp: 98.7 F (37.1 C)  TempSrc: Oral  SpO2: 97%     General appearance: Alert, NAD, appears stated age Head: NCAT Throat: lips, mucosa, and tongue normal; teeth and gums normal Lungs: CTA bilaterally without adventitious breath sounds Heart: regular rate and rhythm.   Back: no CVA tenderness Abdomen: soft, non-tender; bowel sounds normal; no guarding  GU: deferred Skin: warm and dry Psychological:  Alert and cooperative. Normal mood and affect.  LABS:  Results for orders placed or performed during the hospital encounter of 07/11/20  POCT urinalysis dipstick  Result Value Ref Range   Color, UA yellow yellow   Clarity, UA clear clear   Glucose, UA negative negative mg/dL   Bilirubin, UA negative negative   Ketones, POC UA negative negative mg/dL   Spec Grav, UA >=2.025 (A) 1.010 - 1.025   Blood, UA trace-lysed (A) negative   pH, UA 6.5 5.0 - 8.0   Protein Ur, POC =30 (A) negative mg/dL   Urobilinogen, UA 1.0 0.2 or 1.0 E.U./dL   Nitrite, UA Negative Negative   Leukocytes, UA Negative Negative  POCT urine pregnancy  Result Value Ref Range   Preg Test, Ur Negative Negative    Labs Reviewed  POCT URINALYSIS DIP (MANUAL ENTRY) - Abnormal; Notable for the following components:      Result Value   Spec Grav, UA >=1.030 (*)    Blood, UA trace-lysed (*)    Protein Ur, POC =30 (*)    All other components within normal limits  URINE CULTURE  POCT URINE PREGNANCY  CERVICOVAGINAL ANCILLARY ONLY    ASSESSMENT & PLAN:  1. Vaginal odor   2. Vaginal discharge   3. Hematuria, unspecified type     Meds ordered this encounter  Medications  . nitrofurantoin, macrocrystal-monohydrate, (MACROBID) 100 MG capsule    Sig: Take 1 capsule (100 mg total) by mouth 2 (two) times daily.    Dispense:  10 capsule    Refill:  0    Order Specific Question:   Supervising Provider    Answer:   Eustace Moore [[id number]]    Pending: Labs Reviewed  POCT URINALYSIS DIP (MANUAL ENTRY) - Abnormal; Notable for the following components:      Result Value   Spec Grav, UA >=1.030 (*)    Blood, UA trace-lysed (*)    Protein Ur, POC =30 (*)    All other components within normal limits  URINE CULTURE  POCT URINE PREGNANCY  CERVICOVAGINAL ANCILLARY ONLY   Urine pregnancy negative Will cover for UTI today Culture sent.  We will call you with abnormal results Vaginal self-swab obtained.  We will follow up with you  regarding abnormal results Follow up with PCP Return here or go to ER if you have any new or worsening symptoms fever, chills, nausea, vomiting, abdominal or pelvic pain, painful intercourse, vaginal discharge, vaginal bleeding, persistent symptoms despite treatment, etc...  Reviewed expectations re: course of current medical issues. Questions answered. Outlined signs and symptoms indicating need for more acute intervention. Patient verbalized understanding. After Visit Summary given.       Rennis Harding, PA-C 07/11/20 2003

## 2020-07-11 NOTE — Discharge Instructions (Signed)
Urine pregnancy negative Will cover for UTI today Culture sent.  We will call you with abnormal results Vaginal self-swab obtained.  We will follow up with you regarding abnormal results Follow up with PCP Return here or go to ER if you have any new or worsening symptoms fever, chills, nausea, vomiting, abdominal or pelvic pain, painful intercourse, vaginal discharge, vaginal bleeding, persistent symptoms despite treatment, etc..[name]

## 2020-07-11 NOTE — ED Triage Notes (Signed)
Foul odor to urine x 2 weeks.  Noticed blood in urine.  Lower back pain

## 2020-07-12 ENCOUNTER — Encounter (HOSPITAL_BASED_OUTPATIENT_CLINIC_OR_DEPARTMENT_OTHER): Payer: Self-pay | Admitting: Emergency Medicine

## 2020-07-12 ENCOUNTER — Other Ambulatory Visit: Payer: Self-pay

## 2020-07-12 ENCOUNTER — Emergency Department (HOSPITAL_BASED_OUTPATIENT_CLINIC_OR_DEPARTMENT_OTHER): Payer: PRIVATE HEALTH INSURANCE

## 2020-07-12 ENCOUNTER — Emergency Department (HOSPITAL_BASED_OUTPATIENT_CLINIC_OR_DEPARTMENT_OTHER)
Admission: EM | Admit: 2020-07-12 | Discharge: 2020-07-13 | Disposition: A | Discharge status: Home / Self Care | Payer: PRIVATE HEALTH INSURANCE | Attending: Emergency Medicine | Admitting: Emergency Medicine

## 2020-07-12 DIAGNOSIS — R319 Hematuria, unspecified: Secondary | ICD-10-CM | POA: Insufficient documentation

## 2020-07-12 DIAGNOSIS — R1084 Generalized abdominal pain: Secondary | ICD-10-CM | POA: Insufficient documentation

## 2020-07-12 DIAGNOSIS — R198 Other specified symptoms and signs involving the digestive system and abdomen: Secondary | ICD-10-CM | POA: Diagnosis not present

## 2020-07-12 DIAGNOSIS — R102 Pelvic and perineal pain: Secondary | ICD-10-CM | POA: Diagnosis not present

## 2020-07-12 DIAGNOSIS — R109 Unspecified abdominal pain: Secondary | ICD-10-CM

## 2020-07-12 LAB — BASIC METABOLIC PANEL
Anion gap: 9 (ref 5–15)
BUN: 10 mg/dL (ref 6–20)
CO2: 24 mmol/L (ref 22–32)
Calcium: 9.8 mg/dL (ref 8.9–10.3)
Chloride: 102 mmol/L (ref 98–111)
Creatinine, Ser: 0.66 mg/dL (ref 0.44–1.00)
GFR, Estimated: >60 mL/min (ref >60)
Glucose, Bld: 167 mg/dL — ABNORMAL HIGH (ref 70–99)
Potassium: 3.6 mmol/L (ref 3.5–5.1)
Sodium: 135 mmol/L (ref 135–145)

## 2020-07-12 LAB — URINALYSIS, ROUTINE W REFLEX MICROSCOPIC
Bilirubin Urine: NEGATIVE
Glucose, UA: NEGATIVE mg/dL
Ketones, ur: NEGATIVE mg/dL
Leukocytes,Ua: NEGATIVE
Nitrite: NEGATIVE
Protein, ur: NEGATIVE mg/dL
Specific Gravity, Urine: <1.005 — ABNORMAL LOW (ref 1.005–1.030)
pH: 6 (ref 5.0–8.0)

## 2020-07-12 LAB — CBC WITH DIFFERENTIAL/PLATELET
Abs Immature Granulocytes: 0.02 10*3/uL (ref 0.00–0.07)
Basophils Absolute: 0 10*3/uL (ref 0.0–0.1)
Basophils Relative: 0 %
Eosinophils Absolute: 0.1 10*3/uL (ref 0.0–0.5)
Eosinophils Relative: 1 %
HCT: 39.2 % (ref 36.0–46.0)
Hemoglobin: 13 g/dL (ref 12.0–15.0)
Immature Granulocytes: 0 %
Lymphocytes Relative: 22 %
Lymphs Abs: 2.2 10*3/uL (ref 0.7–4.0)
MCH: 29.1 pg (ref 26.0–34.0)
MCHC: 33.2 g/dL (ref 30.0–36.0)
MCV: 87.9 fL (ref 80.0–100.0)
Monocytes Absolute: 0.6 10*3/uL (ref 0.1–1.0)
Monocytes Relative: 6 %
Neutro Abs: 7.1 10*3/uL (ref 1.7–7.7)
Neutrophils Relative %: 71 %
Platelets: 273 10*3/uL (ref 150–400)
RBC: 4.46 MIL/uL (ref 3.87–5.11)
RDW: 12.5 % (ref 11.5–15.5)
WBC: 10.1 10*3/uL (ref 4.0–10.5)
nRBC: 0 % (ref 0.0–0.2)

## 2020-07-12 LAB — PREGNANCY, URINE: Preg Test, Ur: NEGATIVE

## 2020-07-12 MED ORDER — FENTANYL CITRATE (PF) 100 MCG/2ML IJ SOLN
50.0000 ug | Freq: Once | INTRAMUSCULAR | Status: AC
Start: 2020-07-12 — End: 2020-07-12
  Administered 2020-07-12: 50 ug via INTRAVENOUS
  Filled 2020-07-12: qty 2

## 2020-07-12 MED ORDER — ONDANSETRON HCL 4 MG/2ML IJ SOLN
4.0000 mg | Freq: Once | INTRAMUSCULAR | Status: AC
  Administered 2020-07-12: 4 mg via INTRAVENOUS
  Filled 2020-07-12: qty 2

## 2020-07-12 NOTE — ED Provider Notes (Signed)
[facility] EMERGENCY DEPARTMENT Provider Note  CSN: [id number] Arrival date & time: 07/12/20 2259    History Chief Complaint  Patient presents with  . Abdominal Pain  . Hematuria    HPI  Hannah Walker is a 20 y.o. female with history of PCOS presents for evaluation of L sided pelvic pain and hematuria. She had 1-2 days of vaginal bleeding about 2 weeks ago with some discomfort. She called her PCP who thought I might be a miscarriage (she has had one before, does not have regular menses) but symptoms resolved after just a couple of days. She had recurrence of pelvic pain a few days ago, initially with some blood in her underwear she thought was vaginal spotting and blood in her urine. She has continued to have the pain, sometimes radiating into her back, associated with dry heaves and continued hematuria, but she has not had any further vaginal bleeding. She was seen at [facility] yesterday where pregnancy was neg. She did vaginal self-swab so a pelvic was not done. Her UA did not show signs of infection but the provider started Macrobid pending urine culture. Symptoms worsened tonight prompting her ED visit. She had a similar episode in August of last year, seen at [facility] and diagnosed with ovarian cyst after CT and [place] were done.    Past Medical History:  Diagnosis Date  . Anxiety   . Headache   . PCO (polycystic ovaries)   . Renal disorder    kidney stones    Past Surgical History:  Procedure Laterality Date  . NO PAST SURGERIES    . TENDON REPAIR Right 05/15/2018   Procedure: EXTENSOR TENDON REPAIR RIGHT THUMB;  Surgeon: Knute Neu, MD;  Location: [facility] OR;  Service: Plastics;  Laterality: Right;    Family History  Problem Relation Age of Onset  . Healthy Mother   . Healthy Father     Social History   Tobacco Use  . Smoking status: Never Smoker  . Smokeless tobacco: Never Used  Vaping Use  . Vaping Use: Never used  Substance Use Topics  . Alcohol use: No  .  Drug use: No     Home Medications Prior to Admission medications   Medication Sig Start Date End Date Taking? Authorizing Provider  cyclobenzaprine (FLEXERIL) 10 MG tablet Take 10 mg by mouth 3 (three) times daily as needed for muscle spasms.    [provider]  escitalopram (LEXAPRO) 20 MG tablet Take 20 mg by mouth daily. 05/08/20   [provider]  etonogestrel-ethinyl estradiol (NUVARING) 0.12-0.015 MG/24HR vaginal ring Place vaginally. 05/25/20   [provider]  hydrOXYzine (ATARAX/VISTARIL) 25 MG tablet Take 25 mg by mouth every 8 (eight) hours as needed. 04/11/20   [provider]  meloxicam (MOBIC) 7.5 MG tablet Take 7.5 mg by mouth in the morning and at bedtime.    [provider]  nitrofurantoin, macrocrystal-monohydrate, (MACROBID) 100 MG capsule Take 1 capsule (100 mg total) by mouth 2 (two) times daily. 07/11/20   Wurst, [place], PA-C  NURTEC 75 MG TBDP Take 1 tablet at onset of migraine 06/15/20   Elveria Rising, NP  ondansetron (ZOFRAN) 4 MG tablet Take 1 tablet (4 mg total) by mouth every 8 (eight) hours as needed for nausea or vomiting. 06/12/20   Deetta Perla, MD  ESTARYLLA 0.25-35 MG-MCG tablet TK 1 T PO D 04/08/18 02/18/19  [provider]  etonogestrel (NEXPLANON) 68 MG IMPL implant Nexplanon 68 mg subdermal implant  Inject 1 implant by subcutaneous route.  11/16/19  [provider]  topiramate (TOPAMAX) 25 MG tablet TAKE 4 TABLETS BY MOUTH AT NIGHT TIME 11/17/18 02/18/19  Deetta PerlaHickling, William H, MD     Allergies    Ampicillin   Review of Systems   Review of Systems A comprehensive review of systems was completed and negative except as noted in HPI.    Physical Exam BP 113/77   Pulse 90   Temp 97.7 F (36.5 C) (Oral)   Resp 18   Ht 5\' 5"  (1.651 m)   Wt 72.6 kg   SpO2 98%   BMI 26.63 kg/m   Physical Exam Vitals and nursing note reviewed.  Constitutional:      Appearance: Normal appearance.   HENT:     Head: Normocephalic and atraumatic.     Nose: Nose normal.     Mouth/Throat:     Mouth: Mucous membranes are moist.  Eyes:     Extraocular Movements: Extraocular movements intact.     Conjunctiva/sclera: Conjunctivae normal.  Cardiovascular:     Rate and Rhythm: Normal rate.  Pulmonary:     Effort: Pulmonary effort is normal.     Breath sounds: Normal breath sounds.  Abdominal:     General: Abdomen is flat.     Palpations: Abdomen is soft.     Tenderness: There is generalized abdominal tenderness. There is no guarding. Negative signs include Murphy's sign and McBurney's sign.  Musculoskeletal:        General: No swelling. Normal range of motion.     Cervical back: Neck supple.  Skin:    General: Skin is warm and dry.  Neurological:     General: No focal deficit present.     Mental Status: She is alert.  Psychiatric:        Mood and Affect: Mood normal.      ED Results / Procedures / Treatments   Labs (all labs ordered are listed, but only abnormal results are displayed) Labs Reviewed  BASIC METABOLIC PANEL - Abnormal; Notable for the following components:      Result Value   Glucose, Bld 167 (*)    All other components within normal limits  URINALYSIS, ROUTINE W REFLEX MICROSCOPIC - Abnormal; Notable for the following components:   Color, Urine COLORLESS (*)    Specific Gravity, Urine <1.005 (*)    Hgb urine dipstick LARGE (*)    All other components within normal limits  CBC WITH DIFFERENTIAL/PLATELET  PREGNANCY, URINE    EKG None   Radiology CT Renal Stone Study  Result Date: 07/13/2020 CLINICAL DATA:  Flank pain, nephrolithiasis, lower abdominal pain EXAM: CT ABDOMEN AND PELVIS WITHOUT CONTRAST TECHNIQUE: Multidetector CT imaging of the abdomen and pelvis was performed following the standard protocol without IV contrast. COMPARISON:  10/31/2019 FINDINGS: Lower chest: No acute abnormality. Hepatobiliary: No focal liver abnormality is seen. No  gallstones, gallbladder wall thickening, or biliary dilatation. Pancreas: Unremarkable Spleen: Unremarkable Adrenals/Urinary Tract: Adrenal glands are unremarkable. Kidneys are normal, without renal calculi, focal lesion, or hydronephrosis. Bladder is unremarkable. Stomach/Bowel: Stomach is within normal limits. Appendix appears normal. No evidence of bowel wall thickening, distention, or inflammatory changes. Moderate stool throughout the proximal colon. No free intraperitoneal gas or fluid. Vascular/Lymphatic: No significant vascular findings are present. No enlarged abdominal or pelvic lymph nodes. Reproductive: Uterus and bilateral adnexa are unremarkable. Other: Tiny fat containing umbilical hernia. Rectum unremarkable. Musculoskeletal: No acute bone abnormality. No lytic or blastic bone lesion identified.  IMPRESSION: No acute intra-abdominal pathology identified. No definite radiographic explanation for the patient's reported symptoms. No nephro or urolithiasis. Moderate stool without evidence of obstruction. Electronically Signed   By: Helyn Numbers MD   On: 07/13/2020 01:25   US PELVIC COMPLETE W TRANSVAGINAL AND TORSION R/O  Result Date: 07/13/2020 CLINICAL DATA:  Abdominal pain, vaginal bleeding, LMP 12/23/2019 EXAM: TRANSABDOMINAL AND TRANSVAGINAL ULTRASOUND OF PELVIS DOPPLER ULTRASOUND OF OVARIES TECHNIQUE: Both transabdominal and transvaginal ultrasound examinations of the pelvis were performed. Transabdominal technique was performed for global imaging of the pelvis including uterus, ovaries, adnexal regions, and pelvic cul-de-sac. It was necessary to proceed with endovaginal exam following the transabdominal exam to visualize the endometrium and ovaries bilaterally. Color and duplex Doppler ultrasound was utilized to evaluate blood flow to the ovaries. COMPARISON:  None. FINDINGS: Uterus Measurements: 6.2 x 3.0 x 4.2 cm = volume: 42 mL. No fibroids or other mass visualized. Endometrium Thickness:  3 mm.  No focal abnormality visualized. Right ovary Measurements: 3.0 x 1.6 x 2.2 cm = volume: 5 mL. Normal appearance/no adnexal mass. There are at least 17 follicles identified within the right ovary the largest of which measures 5 mm in greatest dimension. Left ovary Measurements: 2.7 x 1.7 x 1.8 cm = volume: 4 mL. Normal appearance/no adnexal mass. There are at least 9 follicles within the left ovary, the largest of which measures 6 mm. Pulsed Doppler evaluation of both ovaries demonstrates normal low-resistance arterial and venous waveforms. Other findings No abnormal free fluid. IMPRESSION: Numerous small follicles within the ovaries bilaterally. In the appropriate clinical setting, multitude of follicles within the right ovary would be compatible with underlying polycystic ovarian syndrome. Otherwise normal pelvic sonogram. Electronically Signed   By: Helyn Numbers MD   On: 07/13/2020 00:55    Procedures Procedures  Medications Ordered in the ED Medications  fentaNYL (SUBLIMAZE) injection 50 mcg (50 mcg Intravenous Given 07/12/20 2345)  ondansetron (ZOFRAN) injection 4 mg (4 mg Intravenous Given 07/12/20 2345)  ketorolac (TORADOL) 30 MG/ML injection 30 mg (30 mg Intravenous Given 07/13/20 0055)     MDM Rules/Calculators/A&P MDM  ED Course  I have reviewed the triage vital signs and the nursing notes.  Pertinent labs & imaging results that were available during my care of the patient were reviewed by me and considered in my medical decision making (see chart for details).  Clinical Course as of 07/13/20 0136  Wed Jul 12, 2020  2328 CBC is normal.  [CS]  2335 Chaperone present: patient has small amount of old vaginal bleeding. No discharge. Mild adnexal tenderness, L>R. Will send for [place].  [CS]  2349 BMP is normal.  [CS]  2359 UA with blood but no signs of infection. Preg is negative.  [CS]  Thu Jul 13, 2020  0050 Pain initially improved but now returning. Awaiting [place] results. Will  give a dose of toradol and reassess.  [CS]  0059 US shows small follicles, but no cyst or torsion to explain pain. Will send for CT to rule out renal stone as well.  [CS]  0134 CT is neg for acute process. Patient reports pain is improved. Plan discharge with outpatient Gyn follow up. She has pain medications at home she can take. RTED for any other concerns.  [CS]    Clinical Course User Index [CS] Pollyann Savoy, MD    Final Clinical Impression(s) / ED Diagnoses Final diagnoses:  Pelvic pain    Rx / DC Orders ED Discharge Orders  None       Pollyann Savoy, MD 07/13/20 [phone]

## 2020-07-12 NOTE — ED Triage Notes (Addendum)
  Patient comes in with lower abdominal pain that has been going on for about 8 days.  Patient states she was having bleeding when it started and was told by her OBGYN that she may be having a miscarriage.  Patient states she has foul smelling urine and hematuria.  Pain 6/10, sharp and cramping across lower abdomen.  Was seen at [facility] for same yesterday and was told everything looked ok aside from blood in urine.  Patient has hx of PCOS.

## 2020-07-13 ENCOUNTER — Emergency Department (HOSPITAL_BASED_OUTPATIENT_CLINIC_OR_DEPARTMENT_OTHER): Payer: PRIVATE HEALTH INSURANCE

## 2020-07-13 ENCOUNTER — Telehealth (HOSPITAL_COMMUNITY): Payer: Self-pay | Admitting: Emergency Medicine

## 2020-07-13 LAB — CERVICOVAGINAL ANCILLARY ONLY
Bacterial Vaginitis (gardnerella): POSITIVE — AB
Candida Glabrata: NEGATIVE
Candida Vaginitis: NEGATIVE
Chlamydia: NEGATIVE
Comment: NEGATIVE
Comment: NEGATIVE
Comment: NEGATIVE
Comment: NEGATIVE
Comment: NEGATIVE
Comment: NORMAL
Neisseria Gonorrhea: NEGATIVE
Trichomonas: NEGATIVE

## 2020-07-13 LAB — URINE CULTURE: Culture: NO GROWTH

## 2020-07-13 MED ORDER — KETOROLAC TROMETHAMINE 30 MG/ML IJ SOLN
30.0000 mg | Freq: Once | INTRAMUSCULAR | Status: AC
  Administered 2020-07-13: 30 mg via INTRAVENOUS
  Filled 2020-07-13: qty 1

## 2020-07-13 MED ORDER — METRONIDAZOLE 500 MG PO TABS: 500.0000 mg | ORAL_TABLET | Freq: Two times a day (BID) | ORAL | 0 refills | Status: DC

## 2020-08-03 ENCOUNTER — Encounter (INDEPENDENT_AMBULATORY_CARE_PROVIDER_SITE_OTHER): Payer: Self-pay

## 2020-08-23 ENCOUNTER — Inpatient Hospital Stay (HOSPITAL_COMMUNITY): Payer: PRIVATE HEALTH INSURANCE

## 2020-08-23 ENCOUNTER — Inpatient Hospital Stay (HOSPITAL_COMMUNITY)
Admission: AD | Admit: 2020-08-23 | Discharge: 2020-08-23 | Disposition: A | Discharge status: Home / Self Care | Payer: PRIVATE HEALTH INSURANCE | Attending: Obstetrics & Gynecology | Admitting: Obstetrics & Gynecology

## 2020-08-23 ENCOUNTER — Encounter (HOSPITAL_COMMUNITY): Payer: Self-pay | Admitting: Obstetrics & Gynecology

## 2020-08-23 ENCOUNTER — Other Ambulatory Visit: Payer: Self-pay

## 2020-08-23 DIAGNOSIS — O99281 Endocrine, nutritional and metabolic diseases complicating pregnancy, first trimester: Secondary | ICD-10-CM | POA: Diagnosis not present

## 2020-08-23 DIAGNOSIS — Z3A01 Less than 8 weeks gestation of pregnancy: Secondary | ICD-10-CM | POA: Insufficient documentation

## 2020-08-23 DIAGNOSIS — E282 Polycystic ovarian syndrome: Secondary | ICD-10-CM | POA: Insufficient documentation

## 2020-08-23 DIAGNOSIS — Z3491 Encounter for supervision of normal pregnancy, unspecified, first trimester: Secondary | ICD-10-CM

## 2020-08-23 DIAGNOSIS — O209 Hemorrhage in early pregnancy, unspecified: Secondary | ICD-10-CM | POA: Diagnosis not present

## 2020-08-23 DIAGNOSIS — R109 Unspecified abdominal pain: Secondary | ICD-10-CM | POA: Diagnosis present

## 2020-08-23 LAB — HCG, QUANTITATIVE, PREGNANCY: hCG, Beta Chain, Quant, S: 25586 m[IU]/mL — ABNORMAL HIGH (ref <5)

## 2020-08-23 LAB — URINALYSIS, ROUTINE W REFLEX MICROSCOPIC
Bilirubin Urine: NEGATIVE
Glucose, UA: NEGATIVE mg/dL
Hgb urine dipstick: NEGATIVE
Ketones, ur: NEGATIVE mg/dL
Leukocytes,Ua: NEGATIVE
Nitrite: NEGATIVE
Protein, ur: NEGATIVE mg/dL
Specific Gravity, Urine: 1.013 (ref 1.005–1.030)
pH: 6 (ref 5.0–8.0)

## 2020-08-23 LAB — CBC
HCT: 38.4 % (ref 36.0–46.0)
Hemoglobin: 13 g/dL (ref 12.0–15.0)
MCH: 29.4 pg (ref 26.0–34.0)
MCHC: 33.9 g/dL (ref 30.0–36.0)
MCV: 86.9 fL (ref 80.0–100.0)
Platelets: 316 10*3/uL (ref 150–400)
RBC: 4.42 MIL/uL (ref 3.87–5.11)
RDW: 13.4 % (ref 11.5–15.5)
WBC: 10 10*3/uL (ref 4.0–10.5)
nRBC: 0 % (ref 0.0–0.2)

## 2020-08-23 LAB — ABO/RH: ABO/RH(D): O POS

## 2020-08-23 LAB — WET PREP, GENITAL
Clue Cells Wet Prep HPF POC: NONE SEEN
Sperm: NONE SEEN
Trich, Wet Prep: NONE SEEN
Yeast Wet Prep HPF POC: NONE SEEN

## 2020-08-23 LAB — POCT PREGNANCY, URINE: Preg Test, Ur: POSITIVE — AB

## 2020-08-23 MED ORDER — ONDANSETRON 4 MG PO TBDP: 4.0000 mg | ORAL_TABLET | Freq: Three times a day (TID) | ORAL | 1 refills | Status: DC | PRN

## 2020-08-23 MED ORDER — PROMETHAZINE HCL 25 MG PO TABS: 25.0000 mg | ORAL_TABLET | Freq: Four times a day (QID) | ORAL | 0 refills | Status: DC | PRN

## 2020-08-23 NOTE — Discharge Instructions (Signed)
Obstetrics: Normal and Problem Pregnancies (7th ed., pp. 102-121). [place], [place]: Elsevier."> Textbook of Family Medicine (9th ed., pp. 365-410). [place], [place]: [employer].">  First Trimester of Pregnancy  The first trimester of pregnancy starts on the first day of your last menstrual period until the end of week 12. This is months 1 through 3 of pregnancy. A week after a sperm fertilizes an egg, the egg will implant into the wall of the uterus and begin to develop into a baby. By the end of 12 weeks, all the baby's organs will be formed and the baby will be 2-3 inches in size. Body changes during your first trimester Your body goes through many changes during pregnancy. The changes vary and generally return to normal after your baby is born. Physical changes  You may gain or lose weight.  Your breasts may begin to grow larger and become tender. The tissue that surrounds your nipples (areola) may become darker.  Dark spots or blotches (chloasma or mask of pregnancy) may develop on your face.  You may have changes in your hair. These can include thickening or thinning of your hair or changes in texture. Health changes  You may feel nauseous, and you may vomit.  You may have heartburn.  You may develop headaches.  You may develop constipation.  Your gums may bleed and may be sensitive to brushing and flossing. Other changes  You may tire easily.  You may urinate more often.  Your menstrual periods will stop.  You may have a loss of appetite.  You may develop cravings for certain kinds of food.  You may have changes in your emotions from day to day.  You may have more vivid and strange dreams. Follow these instructions at home: Medicines  Follow your health care provider's instructions regarding medicine use. Specific medicines may be either safe or unsafe to take during pregnancy. Do not take any medicines unless told to by your health care provider.  Take a  prenatal vitamin that contains at least 600 micrograms (mcg) of folic acid. Eating and drinking  Eat a healthy diet that includes fresh fruits and vegetables, whole grains, good sources of protein such as meat, eggs, or tofu, and low-fat dairy products.  Avoid raw meat and unpasteurized juice, milk, and cheese. These carry germs that can harm you and your baby.  If you feel nauseous or you vomit: ? Eat 4 or 5 small meals a day instead of 3 large meals. ? Try eating a few soda crackers. ? Drink liquids between meals instead of during meals.  You may need to take these actions to prevent or treat constipation: ? Drink enough fluid to keep your urine pale yellow. ? Eat foods that are high in fiber, such as beans, whole grains, and fresh fruits and vegetables. ? Limit foods that are high in fat and processed sugars, such as fried or sweet foods. Activity  Exercise only as directed by your health care provider. Most people can continue their usual exercise routine during pregnancy. Try to exercise for 30 minutes at least 5 days a week.  Stop exercising if you develop pain or cramping in the lower abdomen or lower back.  Avoid exercising if it is very hot or humid or if you are at high altitude.  Avoid heavy lifting.  If you choose to, you may have sex unless your health care provider tells you not to. Relieving pain and discomfort  Wear a good support bra to relieve breast   tenderness.  Rest with your legs elevated if you have leg cramps or low back pain.  If you develop bulging veins (varicose veins) in your legs: ? Wear support hose as told by your health care provider. ? Elevate your feet for 15 minutes, 3-4 times a day. ? Limit salt in your diet. Safety  Wear your seat belt at all times when driving or riding in a car.  Talk with your health care provider if someone is verbally or physically abusive to you.  Talk with your health care provider if you are feeling sad or have  thoughts of hurting yourself. Lifestyle  Do not use hot tubs, steam rooms, or saunas.  Do not douche. Do not use tampons or scented sanitary pads.  Do not use herbal remedies, alcohol, illegal drugs, or medicines that are not approved by your health care provider. Chemicals in these products can harm your baby.  Do not use any products that contain nicotine or tobacco, such as cigarettes, e-cigarettes, and chewing tobacco. If you need help quitting, ask your health care provider.  Avoid cat litter boxes and soil used by cats. These carry germs that can cause birth defects in the baby and possibly loss of the unborn baby (fetus) by miscarriage or stillbirth. General instructions  During routine prenatal visits in the first trimester, your health care provider will do a physical exam, perform necessary tests, and ask you how things are going. Keep all follow-up visits. This is important.  Ask for help if you have counseling or nutritional needs during pregnancy. Your health care provider can offer advice or refer you to specialists for help with various needs.  Schedule a dentist appointment. At home, brush your teeth with a soft toothbrush. Floss gently.  Write down your questions. Take them to your prenatal visits. Where to find more information  American Pregnancy Association: americanpregnancy.org  American College of Obstetricians and Gynecologists: acog.org/en/Womens%20Health/Pregnancy  Office on Women's Health: womenshealth.gov/pregnancy Contact a health care provider if you have:  Dizziness.  A fever.  Mild pelvic cramps, pelvic pressure, or nagging pain in the abdominal area.  Nausea, vomiting, or diarrhea that lasts for 24 hours or longer.  A bad-smelling vaginal discharge.  Pain when you urinate.  Known exposure to a contagious illness, such as chickenpox, measles, Zika virus, HIV, or hepatitis. Get help right away if you have:  Spotting or bleeding from your  vagina.  Severe abdominal cramping or pain.  Shortness of breath or chest pain.  Any kind of trauma, such as from a fall or a car crash.  New or increased pain, swelling, or redness in an arm or leg. Summary  The first trimester of pregnancy starts on the first day of your last menstrual period until the end of week 12 (months 1 through 3).  Eating 4 or 5 small meals a day rather than 3 large meals may help to relieve nausea and vomiting.  Do not use any products that contain nicotine or tobacco, such as cigarettes, e-cigarettes, and chewing tobacco. If you need help quitting, ask your health care provider.  Keep all follow-up visits. This is important. This information is not intended to replace advice given to you by your health care provider. Make sure you discuss any questions you have with your health care provider. Document Revised: 08/25/2019 Document Reviewed: 07/01/2019 Elsevier Patient Education  2021 [employer] Inc.  Safe Medications in Pregnancy   Acne: Benzoyl Peroxide Salicylic Acid  Backache/Headache: Tylenol: 2 regular strength every   4 hours OR              2 Extra strength every 6 hours  Colds/Coughs/Allergies: Benadryl (alcohol free) 25 mg every 6 hours as needed Breath right strips Claritin Cepacol throat lozenges Chloraseptic throat spray Cold-Eeze- up to three times per day Cough drops, alcohol free Flonase (by prescription only) Guaifenesin Mucinex Robitussin DM (plain only, alcohol free) Saline nasal spray/drops Sudafed (pseudoephedrine) & Actifed ** use only after [redacted] weeks gestation and if you do not have high blood pressure Tylenol Vicks Vaporub Zinc lozenges Zyrtec   Constipation: Colace Ducolax suppositories Fleet enema Glycerin suppositories Metamucil Milk of magnesia Miralax Senokot Smooth move tea  Diarrhea: Kaopectate Imodium A-D  *NO pepto Bismol  Hemorrhoids: Anusol Anusol HC Preparation  H Tucks  Indigestion: Tums Maalox Mylanta Zantac  Pepcid  Insomnia: Benadryl (alcohol free) 25mg every 6 hours as needed Tylenol PM Unisom, no Gelcaps  Leg Cramps: Tums MagGel  Nausea/Vomiting:  Bonine Dramamine Emetrol Ginger extract Sea bands Meclizine  Nausea medication to take during pregnancy:  Unisom (doxylamine succinate 25 mg tablets) Take one tablet daily at bedtime. If symptoms are not adequately controlled, the dose can be increased to a maximum recommended dose of two tablets daily (1/2 tablet in the morning, 1/2 tablet mid-afternoon and one at bedtime). Vitamin B6 100mg tablets. Take one tablet twice a day (up to 200 mg per day).  Skin Rashes: Aveeno products Benadryl cream or 25mg every 6 hours as needed Calamine Lotion 1% cortisone cream  Yeast infection: Gyne-lotrimin 7 Monistat 7   **If taking multiple medications, please check labels to avoid duplicating the same active ingredients **take medication as directed on the label ** Do not exceed 4000 mg of tylenol in 24 hours **Do not take medications that contain aspirin or ibuprofen      

## 2020-08-23 NOTE — MAU Provider Note (Signed)
History     CSN: [id number]  Arrival date and time: 08/23/20 1954   Chief Complaint  Patient presents with  . Abdominal Pain   HPI Hannah Walker is a 20 y.o. G1P0000 at [redacted]w[redacted]d who presents with cramping. She states she was in the shower and had a sudden onset of cramping. She reports spotting a few days ago but none now. She denies any discharge. She reports a positive HPT about 10 days ago but has not been seen by an OB/GYN in this pregnancy. She rates the pain a 4/10 and has not tried anything for the pain.  OB History    Gravida  1   Para  0   Term  0   Preterm  0   AB  0   Living  0     SAB  0   IAB  0   Ectopic  0   Multiple  0   Live Births  0           Past Medical History:  Diagnosis Date  . Anxiety   . Headache   . PCO (polycystic ovaries)   . Renal disorder    kidney stones    Past Surgical History:  Procedure Laterality Date  . NO PAST SURGERIES    . TENDON REPAIR Right 05/15/2018   Procedure: EXTENSOR TENDON REPAIR RIGHT THUMB;  Surgeon: Knute Neu, MD;  Location: [facility] OR;  Service: Plastics;  Laterality: Right;    Family History  Problem Relation Age of Onset  . Healthy Mother   . Healthy Father     Social History   Tobacco Use  . Smoking status: Never Smoker  . Smokeless tobacco: Never Used  Vaping Use  . Vaping Use: Never used  Substance Use Topics  . Alcohol use: No  . Drug use: No    Allergies:  Allergies  Allergen Reactions  . Ampicillin Rash    Has patient had a PCN reaction causing immediate rash, facial/tongue/throat swelling, SOB or lightheadedness with hypotension: Yes Has patient had a PCN reaction causing severe rash involving mucus membranes or skin necrosis: No Has patient had a PCN reaction that required hospitalization: No Has patient had a PCN reaction occurring within the last 10 years: Yes If all of the above answers are "NO", then may proceed with Cephalosporin use.     No medications prior to  admission.    Review of Systems  Constitutional: Negative.  Negative for fatigue and fever.  HENT: Negative.   Respiratory: Negative.  Negative for shortness of breath.   Cardiovascular: Negative.  Negative for chest pain.  Gastrointestinal: Positive for abdominal pain. Negative for constipation, diarrhea, nausea and vomiting.  Genitourinary: Positive for vaginal bleeding. Negative for dysuria and vaginal discharge.  Neurological: Negative.  Negative for dizziness and headaches.   Physical Exam   Blood pressure 107/67, pulse 99, temperature 97.8 F (36.6 C), resp. rate 16, height 5\' 5"  (1.651 m), weight 71.2 kg, last menstrual period 07/12/2020, SpO2 100 %.  Physical Exam Vitals and nursing note reviewed.  Constitutional:      General: She is not in acute distress.    Appearance: She is well-developed.  HENT:     Head: Normocephalic.  Eyes:     Pupils: Pupils are equal, round, and reactive to light.  Cardiovascular:     Rate and Rhythm: Normal rate and regular rhythm.     Heart sounds: Normal heart sounds.  Pulmonary:     Effort:  Pulmonary effort is normal. No respiratory distress.     Breath sounds: Normal breath sounds.  Abdominal:     General: Bowel sounds are normal. There is no distension.     Palpations: Abdomen is soft.     Tenderness: There is no abdominal tenderness.  Skin:    General: Skin is warm and dry.  Neurological:     Mental Status: She is alert and oriented to person, place, and time.  Psychiatric:        Behavior: Behavior normal.        Thought Content: Thought content normal.        Judgment: Judgment normal.     MAU Course  Procedures Results for orders placed or performed during the hospital encounter of 08/23/20 (from the past 24 hour(s))  Urinalysis, Routine w reflex microscopic Urine, Clean Catch     Status: None   Collection Time: 08/23/20  8:25 PM  Result Value Ref Range   Color, Urine YELLOW YELLOW   APPearance CLEAR CLEAR   Specific  Gravity, Urine 1.013 1.005 - 1.030   pH 6.0 5.0 - 8.0   Glucose, UA NEGATIVE NEGATIVE mg/dL   Hgb urine dipstick NEGATIVE NEGATIVE   Bilirubin Urine NEGATIVE NEGATIVE   Ketones, ur NEGATIVE NEGATIVE mg/dL   Protein, ur NEGATIVE NEGATIVE mg/dL   Nitrite NEGATIVE NEGATIVE   Leukocytes,Ua NEGATIVE NEGATIVE  Pregnancy, urine POC     Status: Abnormal   Collection Time: 08/23/20  8:30 PM  Result Value Ref Range   Preg Test, Ur POSITIVE (A) NEGATIVE  CBC     Status: None   Collection Time: 08/23/20  9:15 PM  Result Value Ref Range   WBC 10.0 4.0 - 10.5 K/uL   RBC 4.42 3.87 - 5.11 MIL/uL   Hemoglobin 13.0 12.0 - 15.0 g/dL   HCT 70.9 62.8 - 36.6 %   MCV 86.9 80.0 - 100.0 fL   MCH 29.4 26.0 - 34.0 pg   MCHC 33.9 30.0 - 36.0 g/dL   RDW 29.4 76.5 - 46.5 %   Platelets 316 150 - 400 K/uL   nRBC 0.0 0.0 - 0.2 %  hCG, quantitative, pregnancy     Status: Abnormal   Collection Time: 08/23/20  9:15 PM  Result Value Ref Range   hCG, Beta Chain, Quant, S 25,586 (H) <5 mIU/mL  ABO/Rh     Status: None   Collection Time: 08/23/20  9:15 PM  Result Value Ref Range   ABO/RH(D) O POS    No rh immune globuloin      NOT A RH IMMUNE GLOBULIN CANDIDATE, PT RH POSITIVE Performed at [facility], [address] [place], [place] [address]   Wet prep, genital     Status: Abnormal   Collection Time: 08/23/20 10:32 PM   Specimen: Vaginal  Result Value Ref Range   Yeast Wet Prep HPF POC NONE SEEN NONE SEEN   Trich, Wet Prep NONE SEEN NONE SEEN   Clue Cells Wet Prep HPF POC NONE SEEN NONE SEEN   WBC, Wet Prep HPF POC MANY (A) NONE SEEN   Sperm NONE SEEN    US OB LESS THAN 14 WEEKS WITH OB TRANSVAGINAL  Result Date: 08/23/2020 CLINICAL DATA:  Severe cramping and lower abdominal pain for 2 hours EXAM: OBSTETRIC <14 WK [place] AND TRANSVAGINAL OB US TECHNIQUE: Both transabdominal and transvaginal ultrasound examinations were performed for complete evaluation of the gestation as well as the maternal uterus,  adnexal regions, and  pelvic cul-de-sac. Transvaginal technique was performed to assess early pregnancy. COMPARISON:  CT renal colic 07/13/2020. Pelvic ultrasound 07/13/2020 FINDINGS: Intrauterine gestational sac: Single Yolk sac:  Visualized. Embryo:  Not Visualized. Cardiac Activity: Not Visualized. MSD: 12.7 mm   6 w   0 d Subchorionic hemorrhage:  None visualized. Maternal uterus/adnexae: Anteverted maternal uterus. No acute or worrisome uterine abnormality. Probable corpus luteum in the left ovary. No concerning adnexal masses. No abnormal pelvic free fluid. IMPRESSION: Single intrauterine gestation at 6 weeks, 0 days by crown-rump length sonographic estimation. Probable early intrauterine gestational sac with yolk sac. Noted fetal pole nor cardiac activity yet visualized. Recommend follow-up quantitative B-HCG levels and follow-up US in 14 days to assess viability. This recommendation follows SRU consensus guidelines: Diagnostic Criteria for Nonviable Pregnancy Early in the First Trimester. Malva Limes Med 2013; 675:9163-84. Electronically Signed   By: Kreg Shropshire M.D.   On: 08/23/2020 22:02   MDM UA, UPT CBC, HCG ABO/Rh- O Pos Wet prep and gc/chlamydia US OB Comp Less 14 weeks with Transvaginal  Assessment and Plan   1. Normal intrauterine pregnancy on prenatal ultrasound in first trimester   2. Vaginal bleeding affecting early pregnancy   3. [redacted] weeks gestation of pregnancy    -Discharge home in stable condition -Rx for zofran and phenergan sent to patient's pharmacy -First trimester precautions discussed -Patient advised to follow-up with [facility] in 2 weeks for repeat ultrasound -Patient may return to MAU as needed or if her condition were to change or worsen   Rolm Bookbinder CNM 08/23/2020, 10:49 PM

## 2020-08-23 NOTE — Progress Notes (Signed)
Written and verbal d/c instructions given and understanding voiced. Pt instructed in collecting vag swabs which she did successfully before d/c home

## 2020-08-23 NOTE — MAU Note (Signed)
Cleone Slim CNM in Triage with pt to discuss test results and plan of care.

## 2020-08-23 NOTE — MAU Note (Addendum)
About an hour ago I was in the shower and started having severe cramping in lower abd. No bleeding. LMP 07-12-20. Positive home upt about 10 days ago

## 2020-08-24 LAB — GC/CHLAMYDIA PROBE AMP (~~LOC~~) NOT AT ARMC
Chlamydia: NEGATIVE
Comment: NEGATIVE
Comment: NORMAL
Neisseria Gonorrhea: NEGATIVE

## 2020-09-11 DIAGNOSIS — B977 Papillomavirus as the cause of diseases classified elsewhere: Secondary | ICD-10-CM

## 2020-09-11 HISTORY — DX: Papillomavirus as the cause of diseases classified elsewhere: B97.7

## 2020-09-18 ENCOUNTER — Ambulatory Visit (INDEPENDENT_AMBULATORY_CARE_PROVIDER_SITE_OTHER): Payer: PRIVATE HEALTH INSURANCE | Admitting: Pediatrics

## 2020-09-29 LAB — OB RESULTS CONSOLE HIV ANTIBODY (ROUTINE TESTING): HIV: NONREACTIVE

## 2020-09-29 LAB — OB RESULTS CONSOLE HEPATITIS B SURFACE ANTIGEN: Hepatitis B Surface Ag: NEGATIVE

## 2020-09-29 LAB — HEPATITIS C ANTIBODY: HCV Ab: NEGATIVE

## 2020-09-29 LAB — OB RESULTS CONSOLE GC/CHLAMYDIA
Chlamydia: NEGATIVE
Gonorrhea: NEGATIVE

## 2020-09-29 LAB — OB RESULTS CONSOLE RPR: RPR: NONREACTIVE

## 2020-09-29 LAB — OB RESULTS CONSOLE ABO/RH: RH Type: POSITIVE

## 2020-09-29 LAB — OB RESULTS CONSOLE ANTIBODY SCREEN: Antibody Screen: NEGATIVE

## 2020-09-29 LAB — OB RESULTS CONSOLE RUBELLA ANTIBODY, IGM: Rubella: IMMUNE

## 2020-10-03 ENCOUNTER — Other Ambulatory Visit: Payer: Self-pay

## 2020-10-03 ENCOUNTER — Ambulatory Visit
Admission: RE | Admit: 2020-10-03 | Discharge: 2020-10-03 | Disposition: A | Discharge status: Home / Self Care | Payer: No Typology Code available for payment source | Source: Ambulatory Visit | Attending: Family Medicine | Admitting: Family Medicine

## 2020-10-03 VITALS — BP 118/74 | HR 108 | Temp 99.4°F | Resp 16

## 2020-10-03 DIAGNOSIS — O99891 Other specified diseases and conditions complicating pregnancy: Secondary | ICD-10-CM | POA: Diagnosis not present

## 2020-10-03 DIAGNOSIS — J029 Acute pharyngitis, unspecified: Secondary | ICD-10-CM | POA: Diagnosis present

## 2020-10-03 DIAGNOSIS — R350 Frequency of micturition: Secondary | ICD-10-CM | POA: Diagnosis present

## 2020-10-03 DIAGNOSIS — H9203 Otalgia, bilateral: Secondary | ICD-10-CM

## 2020-10-03 DIAGNOSIS — Z3A12 12 weeks gestation of pregnancy: Secondary | ICD-10-CM

## 2020-10-03 DIAGNOSIS — Z3491 Encounter for supervision of normal pregnancy, unspecified, first trimester: Secondary | ICD-10-CM | POA: Insufficient documentation

## 2020-10-03 DIAGNOSIS — O2341 Unspecified infection of urinary tract in pregnancy, first trimester: Secondary | ICD-10-CM | POA: Diagnosis not present

## 2020-10-03 DIAGNOSIS — O99511 Diseases of the respiratory system complicating pregnancy, first trimester: Secondary | ICD-10-CM | POA: Diagnosis not present

## 2020-10-03 LAB — POCT URINALYSIS DIP (MANUAL ENTRY)
Bilirubin, UA: NEGATIVE
Glucose, UA: NEGATIVE mg/dL
Ketones, POC UA: NEGATIVE mg/dL
Leukocytes, UA: NEGATIVE
Nitrite, UA: NEGATIVE
Protein Ur, POC: NEGATIVE mg/dL
Spec Grav, UA: 1.02 (ref 1.010–1.025)
Urobilinogen, UA: 1 E.U./dL
pH, UA: 7 (ref 5.0–8.0)

## 2020-10-03 LAB — POCT RAPID STREP A (OFFICE): Rapid Strep A Screen: NEGATIVE

## 2020-10-03 MED ORDER — AZITHROMYCIN 250 MG PO TABS: ORAL_TABLET | ORAL | 0 refills | Status: DC

## 2020-10-03 NOTE — ED Provider Notes (Signed)
[facility]    CSN: [id number] Arrival date & time: 10/03/20  1558      History   Chief Complaint No chief complaint on file.   HPI Hannah Walker is a 20 y.o. female.   HPI Patient presents for evaluation for evaluation of sore throat, headache, achiness, diarrhea which began today, and urinary frequency.  Patient is 11 weeks .6 days pregnant. Reports history of recurrent strep.  She has a low-grade temp today at 99.4.  She denies any known exposure to anyone positive for COVID.  She has been taking nighttime and daytime cough and cold medication without any relief.  Denies any difficulty breathing or chest tightness.  She is concerned that she may have a urinary tract infection as she has had some urinary frequency more so than baseline pregnancy urination frequency.  No fever, chills or worsening of nausea or vomiting associated with pregnancy.   Past Medical History:  Diagnosis Date   Anxiety    Headache    PCO (polycystic ovaries)    Renal disorder    kidney stones    Patient Active Problem List   Diagnosis Date Noted   Bilateral posterior neck pain 06/12/2020   Tired 06/03/2019   History of ovarian cyst 06/03/2019   Nexplanon in place 06/03/2019   LLQ pain 06/03/2019   Irregular bleeding 06/03/2019   Pelvic pain 06/03/2019   Episodic tension-type headache, not intractable 01/07/2018   Migraine without aura and without status migrainosus, not intractable 07/23/2017   New daily persistent headache 02/27/2017   Concussion without loss of consciousness 02/27/2017    Past Surgical History:  Procedure Laterality Date   NO PAST SURGERIES     TENDON REPAIR Right 05/15/2018   Procedure: EXTENSOR TENDON REPAIR RIGHT THUMB;  Surgeon: Knute Neu, MD;  Location: [facility] OR;  Service: Plastics;  Laterality: Right;    OB History     Gravida  1   Para  0   Term  0   Preterm  0   AB  0   Living  0      SAB  0   IAB  0   Ectopic  0   Multiple  0    Live Births  0            Home Medications    Prior to Admission medications   Medication Sig Start Date End Date Taking? Authorizing Provider  cyclobenzaprine (FLEXERIL) 10 MG tablet Take 10 mg by mouth 3 (three) times daily as needed for muscle spasms.    [provider]  escitalopram (LEXAPRO) 20 MG tablet Take 20 mg by mouth daily. 05/08/20   [provider]  hydrOXYzine (ATARAX/VISTARIL) 25 MG tablet Take 25 mg by mouth every 8 (eight) hours as needed. 04/11/20   [provider]  NURTEC 75 MG TBDP Take 1 tablet at onset of migraine 06/15/20   Elveria Rising, NP  ondansetron (ZOFRAN ODT) 4 MG disintegrating tablet Take 1 tablet (4 mg total) by mouth every 8 (eight) hours as needed for nausea or vomiting. 08/23/20   Rolm Bookbinder, CNM  ondansetron (ZOFRAN) 4 MG tablet Take 1 tablet (4 mg total) by mouth every 8 (eight) hours as needed for nausea or vomiting. 06/12/20   Deetta Perla, MD  promethazine (PHENERGAN) 25 MG tablet Take 1 tablet (25 mg total) by mouth every 6 (six) hours as needed for nausea or vomiting. 08/23/20   Rolm Bookbinder, CNM  [facility]  0.25-35 MG-MCG tablet TK 1 T PO D 04/08/18 02/18/19  [provider]  etonogestrel (NEXPLANON) 68 MG IMPL implant Nexplanon 68 mg subdermal implant  Inject 1 implant by subcutaneous route.  11/16/19  [provider]  topiramate (TOPAMAX) 25 MG tablet TAKE 4 TABLETS BY MOUTH AT NIGHT TIME 11/17/18 02/18/19  Deetta Perla, MD    Family History Family History  Problem Relation Age of Onset   Healthy Mother    Healthy Father     Social History Social History   Tobacco Use   Smoking status: Never   Smokeless tobacco: Never  Vaping Use   Vaping Use: Never used  Substance Use Topics   Alcohol use: No   Drug use: No     Allergies   Ampicillin   Review of Systems Review of Systems Pertinent negatives listed in HPI  Physical Exam Triage Vital Signs ED Triage  Vitals [10/03/20 1617]  Enc Vitals Group     BP 118/74     Pulse Rate (!) 108     Resp 16     Temp 99.4 F (37.4 C)     Temp Source Tympanic     SpO2 98 %     Weight      Height      Head Circumference      Peak Flow      Pain Score      Pain Loc      Pain Edu?      Excl. in GC?    No data found.  Updated Vital Signs BP 118/74 (BP Location: Right Arm)   Pulse (!) 108   Temp 99.4 F (37.4 C) (Tympanic)   Resp 16   LMP 07/12/2020   SpO2 98%   Visual Acuity Right Eye Distance:   Left Eye Distance:   Bilateral Distance:    Right Eye Near:   Left Eye Near:    Bilateral Near:     Physical Exam Constitutional:      Appearance: She is ill-appearing.  HENT:     Right Ear: Tenderness present. A middle ear effusion is present.     Left Ear: Tenderness present. A middle ear effusion is present. Tympanic membrane is erythematous.     Nose: Congestion present.     Mouth/Throat:     Pharynx: Posterior oropharyngeal erythema and uvula swelling present. No oropharyngeal exudate.     Tonsils: 2+ on the right. 2+ on the left.  Cardiovascular:     Rate and Rhythm: Regular rhythm. Tachycardia present.  Pulmonary:     Effort: Pulmonary effort is normal.     Breath sounds: Normal breath sounds. No decreased breath sounds or wheezing.  Skin:    General: Skin is warm.  Neurological:     Mental Status: She is alert.  Psychiatric:        Attention and Perception: Attention normal.        Speech: Speech normal.        Behavior: Behavior normal.        Thought Content: Thought content normal.        Cognition and Memory: Cognition normal.    UC Treatments / Results  Labs (all labs ordered are listed, but only abnormal results are displayed) Labs Reviewed  CULTURE, GROUP A STREP [facility])  POCT RAPID STREP A (OFFICE)    EKG   Radiology No results found.  Procedures Procedures (including critical care time)  Medications Ordered in UC Medications - No  data to  display  Initial Impression / Assessment and Plan / UC Course  I have reviewed the triage vital signs and the nursing notes.  Pertinent labs & imaging results that were available during my care of the patient were reviewed by me and considered in my medical decision making (see chart for details).    Acute pharyngitis with otalgia of unknown etiology.  Given the appearance of throat and limitations of treatment with pregnancy will trial azithromycin.  Tylenol as needed for pain or fever.  Continue to hydrate well with fluids.  Urine collected unremarkable however will obtain a urine culture to ensure no underlying bacteria may be contributing to urine frequency. Final Clinical Impressions(s) / UC Diagnoses   Final diagnoses:  First trimester pregnancy  Acute pharyngitis, unspecified etiology  Otalgia, bilateral  Urine frequency   Discharge Instructions   None    ED Prescriptions     Medication Sig Dispense Auth. Provider   azithromycin (ZITHROMAX) 250 MG tablet Take 2 tabs PO x 1 dose, then 1 tab PO QD x 4 days 6 tablet Bing Neighbors, FNP      PDMP not reviewed this encounter.   Bing Neighbors, FNP 10/03/20 [id number]

## 2020-10-03 NOTE — ED Triage Notes (Signed)
Pt presents with sore throat earache and fever , has h/o strep

## 2020-10-05 LAB — URINE CULTURE: Culture: <10000 — AB

## 2020-10-06 ENCOUNTER — Telehealth: Payer: Self-pay

## 2020-10-06 LAB — CULTURE, GROUP A STREP (THRC)

## 2020-10-06 MED ORDER — CIPROFLOXACIN-DEXAMETHASONE 0.3-0.1 % OT SUSP: 4.0000 [drp] | Freq: Two times a day (BID) | OTIC | 0 refills | Status: DC

## 2020-10-15 ENCOUNTER — Other Ambulatory Visit: Payer: Self-pay

## 2020-10-15 ENCOUNTER — Inpatient Hospital Stay (HOSPITAL_COMMUNITY)
Admission: AD | Admit: 2020-10-15 | Discharge: 2020-10-15 | Disposition: A | Discharge status: Home / Self Care | Payer: No Typology Code available for payment source | Attending: Obstetrics | Admitting: Obstetrics

## 2020-10-15 ENCOUNTER — Ambulatory Visit: Payer: No Typology Code available for payment source

## 2020-10-15 ENCOUNTER — Encounter (HOSPITAL_COMMUNITY): Payer: Self-pay | Admitting: Obstetrics

## 2020-10-15 DIAGNOSIS — Z3A13 13 weeks gestation of pregnancy: Secondary | ICD-10-CM

## 2020-10-15 DIAGNOSIS — B379 Candidiasis, unspecified: Secondary | ICD-10-CM | POA: Diagnosis not present

## 2020-10-15 DIAGNOSIS — O98811 Other maternal infectious and parasitic diseases complicating pregnancy, first trimester: Secondary | ICD-10-CM | POA: Insufficient documentation

## 2020-10-15 DIAGNOSIS — O99891 Other specified diseases and conditions complicating pregnancy: Secondary | ICD-10-CM

## 2020-10-15 DIAGNOSIS — O219 Vomiting of pregnancy, unspecified: Secondary | ICD-10-CM | POA: Insufficient documentation

## 2020-10-15 DIAGNOSIS — B373 Candidiasis of vulva and vagina: Secondary | ICD-10-CM

## 2020-10-15 DIAGNOSIS — R197 Diarrhea, unspecified: Secondary | ICD-10-CM | POA: Insufficient documentation

## 2020-10-15 DIAGNOSIS — O26891 Other specified pregnancy related conditions, first trimester: Secondary | ICD-10-CM | POA: Diagnosis not present

## 2020-10-15 DIAGNOSIS — O210 Mild hyperemesis gravidarum: Secondary | ICD-10-CM | POA: Diagnosis not present

## 2020-10-15 LAB — CBC WITH DIFFERENTIAL/PLATELET
Abs Immature Granulocytes: 0.06 10*3/uL (ref 0.00–0.07)
Basophils Absolute: 0 10*3/uL (ref 0.0–0.1)
Basophils Relative: 0 %
Eosinophils Absolute: 0.1 10*3/uL (ref 0.0–0.5)
Eosinophils Relative: 1 %
HCT: 35 % — ABNORMAL LOW (ref 36.0–46.0)
Hemoglobin: 12.2 g/dL (ref 12.0–15.0)
Immature Granulocytes: 1 %
Lymphocytes Relative: 29 %
Lymphs Abs: 3.5 10*3/uL (ref 0.7–4.0)
MCH: 30.1 pg (ref 26.0–34.0)
MCHC: 34.9 g/dL (ref 30.0–36.0)
MCV: 86.4 fL (ref 80.0–100.0)
Monocytes Absolute: 0.9 10*3/uL (ref 0.1–1.0)
Monocytes Relative: 7 %
Neutro Abs: 7.7 10*3/uL (ref 1.7–7.7)
Neutrophils Relative %: 62 %
Platelets: 287 10*3/uL (ref 150–400)
RBC: 4.05 MIL/uL (ref 3.87–5.11)
RDW: 13.3 % (ref 11.5–15.5)
WBC: 12.3 10*3/uL — ABNORMAL HIGH (ref 4.0–10.5)
nRBC: 0 % (ref 0.0–0.2)

## 2020-10-15 LAB — WET PREP, GENITAL
Clue Cells Wet Prep HPF POC: NONE SEEN
Sperm: NONE SEEN
Trich, Wet Prep: NONE SEEN

## 2020-10-15 LAB — URINALYSIS, ROUTINE W REFLEX MICROSCOPIC
Bilirubin Urine: NEGATIVE
Glucose, UA: NEGATIVE mg/dL
Hgb urine dipstick: NEGATIVE
Ketones, ur: NEGATIVE mg/dL
Leukocytes,Ua: NEGATIVE
Nitrite: NEGATIVE
Protein, ur: NEGATIVE mg/dL
Specific Gravity, Urine: 1.027 (ref 1.005–1.030)
pH: 5 (ref 5.0–8.0)

## 2020-10-15 MED ORDER — ONDANSETRON 8 MG PO TBDP: 8.0000 mg | ORAL_TABLET | Freq: Three times a day (TID) | ORAL | 0 refills | Status: DC | PRN

## 2020-10-15 MED ORDER — ONDANSETRON 4 MG PO TBDP
8.0000 mg | ORAL_TABLET | Freq: Once | ORAL | Status: AC
  Administered 2020-10-15: 8 mg via ORAL
  Filled 2020-10-15: qty 2

## 2020-10-15 MED ORDER — FLUCONAZOLE 150 MG PO TABS
150.0000 mg | ORAL_TABLET | Freq: Once | ORAL | Status: AC
  Administered 2020-10-15: 150 mg via ORAL
  Filled 2020-10-15: qty 1

## 2020-10-15 NOTE — MAU Provider Note (Signed)
History     CSN: [id number]  Arrival date and time: 10/15/20 0006   Event Date/Time   First Provider Initiated Contact with Patient 10/15/20 0135      Chief Complaint  Patient presents with   Urinary Tract Infection   HPI  Hannah Walker is a 20 y.o. female G3P0020 @ 2358w4d  here in MAU with complaints of nausea and vomiting. She reports off and on throughout the pregnancy she has had nausea and vomiting. She took phenergan this morning which did not seem to help much. She has not tried any other anti-emetics.  She also reports having a yeast infection and has tried taking OTC monistat which started burning while using today. She reports frequent yeast infections in her life and is very aware of when one is starting. She also just completed antibiotics for a "virus" which likely caused the yeast infection.  She reports not being able to keep anything down. Nothing makes the symptoms better. Eating and drinking make her nausea worse.   No sick contacts. Watery stool x 2 days. Has tried imodium which did not work. Has not eaten anything fast food recently.  Reports Fever 100.1 x 1 week.  Was seen at urgent care last week and was given a Z pack which was the reason she thinks she got a yeast infection.  Has had diarrhea the past 2 days, has tried imodium 1 x without relief. Unable to provider us with a sample today.    Frequent Urgent care and ER visits throughout her lifetime.   OB History     Gravida  3   Para  0   Term  0   Preterm  0   AB  2   Living  0      SAB  2   IAB  0   Ectopic  0   Multiple  0   Live Births  0           Past Medical History:  Diagnosis Date   Anxiety    Headache    PCO (polycystic ovaries)    Renal disorder    kidney stones    Past Surgical History:  Procedure Laterality Date   NO PAST SURGERIES     TENDON REPAIR Right 05/15/2018   Procedure: EXTENSOR TENDON REPAIR RIGHT THUMB;  Surgeon: Knute Neuoley, Harrill, MD;  Location: [facility]  OR;  Service: Plastics;  Laterality: Right;    Family History  Problem Relation Age of Onset   Healthy Mother    Healthy Father     Social History   Tobacco Use   Smoking status: Never   Smokeless tobacco: Never  Vaping Use   Vaping Use: Never used  Substance Use Topics   Alcohol use: No   Drug use: No    Allergies:  Allergies  Allergen Reactions   Ampicillin Rash    Has patient had a PCN reaction causing immediate rash, facial/tongue/throat swelling, SOB or lightheadedness with hypotension: Yes Has patient had a PCN reaction causing severe rash involving mucus membranes or skin necrosis: No Has patient had a PCN reaction that required hospitalization: No Has patient had a PCN reaction occurring within the last 10 years: Yes If all of the above answers are "NO", then may proceed with Cephalosporin use.     Medications Prior to Admission  Medication Sig Dispense Refill Last Dose   escitalopram (LEXAPRO) 20 MG tablet Take 20 mg by mouth daily.   10/15/2020   hydrOXYzine (  ATARAX/VISTARIL) 25 MG tablet Take 25 mg by mouth every 8 (eight) hours as needed.   10/15/2020   Prenatal Vit-Fe Fumarate-FA (PREPLUS) 27-1 MG TABS Take by mouth.   10/15/2020   promethazine (PHENERGAN) 25 MG tablet Take 1 tablet (25 mg total) by mouth every 6 (six) hours as needed for nausea or vomiting. 30 tablet 0 10/15/2020   azithromycin (ZITHROMAX) 250 MG tablet Take 2 tabs PO x 1 dose, then 1 tab PO QD x 4 days 6 tablet 0 Unknown   ciprofloxacin-dexamethasone (CIPRODEX) OTIC suspension Place 4 drops into the left ear 2 (two) times daily. 7.5 mL 0 Unknown   cyclobenzaprine (FLEXERIL) 10 MG tablet Take 10 mg by mouth 3 (three) times daily as needed for muscle spasms.   Unknown   NURTEC 75 MG TBDP Take 1 tablet at onset of migraine 8 tablet 5 Unknown   ondansetron (ZOFRAN ODT) 4 MG disintegrating tablet Take 1 tablet (4 mg total) by mouth every 8 (eight) hours as needed for nausea or vomiting. 30 tablet 1  Unknown   ondansetron (ZOFRAN) 4 MG tablet Take 1 tablet (4 mg total) by mouth every 8 (eight) hours as needed for nausea or vomiting. 20 tablet 5 Unknown   Recent Results (from the past 2160 hour(s))  Urinalysis, Routine w reflex microscopic Urine, Clean Catch     Status: None   Collection Time: 08/23/20  8:25 PM  Result Value Ref Range   Color, Urine YELLOW YELLOW   APPearance CLEAR CLEAR   Specific Gravity, Urine 1.013 1.005 - 1.030   pH 6.0 5.0 - 8.0   Glucose, UA NEGATIVE NEGATIVE mg/dL   Hgb urine dipstick NEGATIVE NEGATIVE   Bilirubin Urine NEGATIVE NEGATIVE   Ketones, ur NEGATIVE NEGATIVE mg/dL   Protein, ur NEGATIVE NEGATIVE mg/dL   Nitrite NEGATIVE NEGATIVE   Leukocytes,Ua NEGATIVE NEGATIVE    Comment: Performed at [facility], [address] [place], [place] [address]  Pregnancy, urine POC     Status: Abnormal   Collection Time: 08/23/20  8:30 PM  Result Value Ref Range   Preg Test, Ur POSITIVE (A) NEGATIVE    Comment:        THE SENSITIVITY OF THIS METHODOLOGY IS >24 mIU/mL   GC/Chlamydia probe amp ([place])not at [facility]     Status: None   Collection Time: 08/23/20  9:07 PM  Result Value Ref Range   Neisseria Gonorrhea Negative    Chlamydia Negative    Comment Normal Reference Ranger Chlamydia - Negative    Comment      Normal Reference Range Neisseria Gonorrhea - Negative  CBC     Status: None   Collection Time: 08/23/20  9:15 PM  Result Value Ref Range   WBC 10.0 4.0 - 10.5 K/uL   RBC 4.42 3.87 - 5.11 MIL/uL   Hemoglobin 13.0 12.0 - 15.0 g/dL   HCT 68.3 72.9 - 02.1 %   MCV 86.9 80.0 - 100.0 fL   MCH 29.4 26.0 - 34.0 pg   MCHC 33.9 30.0 - 36.0 g/dL   RDW 11.5 52.0 - 80.2 %   Platelets 316 150 - 400 K/uL   nRBC 0.0 0.0 - 0.2 %    Comment: Performed at [facility], [address], [place], [place] [address]  hCG, quantitative, pregnancy     Status: Abnormal   Collection Time: 08/23/20  9:15 PM  Result Value Ref Range   hCG, Beta Chain,  Quant, S 25,586 (  H) <5 mIU/mL    Comment:          GEST. AGE      CONC.  (mIU/mL)   <=1 WEEK        5 - 50     2 WEEKS       50 - 500     3 WEEKS       100 - 10,000     4 WEEKS     1,000 - 30,000     5 WEEKS     3,500 - 115,000   6-8 WEEKS     12,000 - 270,000    12 WEEKS     15,000 - 220,000        FEMALE AND NON-PREGNANT FEMALE:     LESS THAN 5 mIU/mL Performed at [facility], [address] [place], [place] [address]   ABO/Rh     Status: None   Collection Time: 08/23/20  9:15 PM  Result Value Ref Range   ABO/RH(D) O POS    No rh immune globuloin      NOT A RH IMMUNE GLOBULIN CANDIDATE, PT RH POSITIVE Performed at [facility], [address] [place] [address], [place], [place] [address]   Wet prep, genital     Status: Abnormal   Collection Time: 08/23/20 10:32 PM   Specimen: Vaginal  Result Value Ref Range   Yeast Wet Prep HPF POC NONE SEEN NONE SEEN   Trich, Wet Prep NONE SEEN NONE SEEN   Clue Cells Wet Prep HPF POC NONE SEEN NONE SEEN   WBC, Wet Prep HPF POC MANY (A) NONE SEEN   Sperm NONE SEEN     Comment: Performed at [facility], [address], [place], [place] [address]  POCT rapid strep A     Status: None   Collection Time: 10/03/20  4:38 PM  Result Value Ref Range   Rapid Strep A Screen Negative Negative  Culture, group A strep     Status: None   Collection Time: 10/03/20  4:41 PM   Specimen: Throat  Result Value Ref Range   Specimen Description      THROAT Performed at [facility], [address] [place], [place] [address]    Special Requests      NONE Performed at [facility], [address] [place], [place] [address]    Culture      NO GROUP A STREP (S.PYOGENES) ISOLATED Performed at [facility], [address], [place], [place] [address]    Report Status 10/06/2020 FINAL   POCT urinalysis dipstick     Status: Abnormal   Collection Time: 10/03/20  4:57 PM  Result Value Ref Range   Color, UA yellow yellow   Clarity, UA clear clear    Glucose, UA negative negative mg/dL   Bilirubin, UA negative negative   Ketones, POC UA negative negative mg/dL   Spec Grav, UA 4.132 4.401 - 1.025   Blood, UA trace-intact (A) negative   pH, UA 7.0 5.0 - 8.0   Protein Ur, POC negative negative mg/dL   Urobilinogen, UA 1.0 0.2 or 1.0 E.U./dL   Nitrite, UA Negative Negative   Leukocytes, UA Negative Negative  Urine Culture     Status: Abnormal   Collection Time: 10/03/20  5:01 PM   Specimen: Urine, Clean Catch  Result Value Ref Range   Specimen Description      URINE, CLEAN CATCH Performed at [facility], [address] [place], [place] [address]    Special Requests NONE    Culture (A)     <10,000 COLONIES/mL INSIGNIFICANT GROWTH Performed at [facility], [address], [place], [place] [address]    Report Status 10/05/2020 FINAL   GC/Chlamydia probe amp ([place])not at [facility]     Status: None   Collection Time: 10/15/20  1:00 AM  Result Value Ref Range   Neisseria Gonorrhea Negative    Chlamydia Negative    Comment Normal Reference [place] Chlamydia - Negative    Comment      Normal Reference Range Neisseria Gonorrhea - Negative  Wet prep, genital     Status: Abnormal   Collection Time: 10/15/20  1:08 AM   Specimen: Vaginal  Result Value Ref Range   Yeast Wet Prep HPF POC PRESENT (A) NONE SEEN   Trich, Wet Prep NONE SEEN NONE SEEN   Clue Cells Wet Prep HPF POC NONE SEEN NONE SEEN   WBC, Wet Prep HPF POC MANY (A) NONE SEEN   Sperm NONE SEEN     Comment: Performed at [facility], [address], [place], [place] [address]  Urinalysis, Routine w reflex microscopic     Status: Abnormal   Collection Time: 10/15/20  1:13 AM  Result Value Ref Range   Color, Urine YELLOW YELLOW   APPearance HAZY (A) CLEAR   Specific Gravity, Urine 1.027 1.005 - 1.030   pH 5.0 5.0 - 8.0   Glucose, UA NEGATIVE NEGATIVE mg/dL   Hgb urine dipstick NEGATIVE NEGATIVE   Bilirubin Urine NEGATIVE NEGATIVE   Ketones, ur NEGATIVE  NEGATIVE mg/dL   Protein, ur NEGATIVE NEGATIVE mg/dL   Nitrite NEGATIVE NEGATIVE   Leukocytes,Ua NEGATIVE NEGATIVE    Comment: Performed at [facility], [address] [place], [place] [address]  CBC with Differential/Platelet     Status: Abnormal   Collection Time: 10/15/20  2:13 AM  Result Value Ref Range   WBC 12.3 (H) 4.0 - 10.5 K/uL   RBC 4.05 3.87 - 5.11 MIL/uL   Hemoglobin 12.2 12.0 - 15.0 g/dL   HCT 51.0 (L) 25.8 - 52.7 %   MCV 86.4 80.0 - 100.0 fL   MCH 30.1 26.0 - 34.0 pg   MCHC 34.9 30.0 - 36.0 g/dL   RDW 78.2 42.3 - 53.6 %   Platelets 287 150 - 400 K/uL   nRBC 0.0 0.0 - 0.2 %   Neutrophils Relative % 62 %   Neutro Abs 7.7 1.7 - 7.7 K/uL   Lymphocytes Relative 29 %   Lymphs Abs 3.5 0.7 - 4.0 K/uL   Monocytes Relative 7 %   Monocytes Absolute 0.9 0.1 - 1.0 K/uL   Eosinophils Relative 1 %   Eosinophils Absolute 0.1 0.0 - 0.5 K/uL   Basophils Relative 0 %   Basophils Absolute 0.0 0.0 - 0.1 K/uL   Immature Granulocytes 1 %   Abs Immature Granulocytes 0.06 0.00 - 0.07 K/uL    Comment: Performed at [facility], [address], [place], [place] [address]     Review of Systems  Constitutional:  Positive for fatigue.  Gastrointestinal:  Positive for diarrhea, nausea and vomiting.  Physical Exam   Blood pressure 111/72, pulse 92, temperature 98.1 F (36.7 C), temperature source Oral, resp. rate 18, height 5\' 5"  (1.651 m), weight 70.8 kg, last menstrual period 07/12/2020, SpO2 98 %.  Physical Exam Constitutional:      General: She is  not in acute distress.    Appearance: Normal appearance. She is not ill-appearing, toxic-appearing or diaphoretic.  Pulmonary:     Effort: Pulmonary effort is normal.  Abdominal:     Palpations: Abdomen is soft.     Tenderness: There is no abdominal tenderness.  Musculoskeletal:        General: Normal range of motion.  Skin:    General: Skin is warm.  Neurological:     Mental Status: She is alert and oriented to  person, place, and time.  Psychiatric:        Mood and Affect: Mood is anxious. Affect is blunt.        Behavior: Behavior normal.    MAU Course  Procedures None  MDM  Zofran 8 mg ODT Diflucan 150 mg x 1 dose.  Patient sleeping, well appearing and tolerating PO  UA without signs of dehydration   Assessment and Plan   A:  1. Nausea and vomiting in pregnancy   2. Yeast infection   3. Diarrhea, unspecified type      P:  Discharge home in stable condition Rx: Zofran Ok to use imodium over the counter as directed If diarrhea continues, should see GI Return to MAU for OB emergencies.   Venia Carbon I, NP 10/18/2020 12:09 PM

## 2020-10-15 NOTE — MAU Note (Signed)
Pt reports to MAU with c/o vaginal burning since using Monistat.  Pt also reports n/v and diarrhea x 4.  Pt reports partner has been unfaithful and found out last week.

## 2020-10-16 LAB — GC/CHLAMYDIA PROBE AMP (~~LOC~~) NOT AT ARMC
Chlamydia: NEGATIVE
Comment: NEGATIVE
Comment: NORMAL
Neisseria Gonorrhea: NEGATIVE

## 2020-10-30 ENCOUNTER — Telehealth: Payer: Self-pay | Admitting: Emergency Medicine

## 2020-10-30 ENCOUNTER — Ambulatory Visit
Admission: EM | Admit: 2020-10-30 | Discharge: 2020-10-30 | Disposition: A | Discharge status: Home / Self Care | Payer: No Typology Code available for payment source | Attending: Urgent Care | Admitting: Urgent Care

## 2020-10-30 ENCOUNTER — Other Ambulatory Visit: Payer: Self-pay

## 2020-10-30 ENCOUNTER — Encounter: Payer: Self-pay | Admitting: Emergency Medicine

## 2020-10-30 DIAGNOSIS — O99512 Diseases of the respiratory system complicating pregnancy, second trimester: Secondary | ICD-10-CM

## 2020-10-30 DIAGNOSIS — J018 Other acute sinusitis: Secondary | ICD-10-CM | POA: Diagnosis not present

## 2020-10-30 DIAGNOSIS — Z3A15 15 weeks gestation of pregnancy: Secondary | ICD-10-CM | POA: Diagnosis not present

## 2020-10-30 DIAGNOSIS — R059 Cough, unspecified: Secondary | ICD-10-CM

## 2020-10-30 DIAGNOSIS — R0981 Nasal congestion: Secondary | ICD-10-CM

## 2020-10-30 MED ORDER — AMOXICILLIN 875 MG PO TABS: 875.0000 mg | ORAL_TABLET | Freq: Two times a day (BID) | ORAL | 0 refills | Status: DC

## 2020-10-30 NOTE — ED Triage Notes (Signed)
Pt here for cough and URI sx x 1 month that are not improving; pt sts currently [redacted] weeks pregnant; pt sts some SOB at times

## 2020-10-30 NOTE — ED Provider Notes (Signed)
[facility]   MRN: [id number] DOB: September 02, 2000  Subjective:   Hannah Walker is a 20 y.o. female presenting for 1 month history of persistent cough, sinus congestion, postnasal drainage, bilateral ear fullness and popping.  Patient has tested negative for COVID within the past 2 weeks.  She has been using Flonase, Zyrtec daily with minimal relief.  She did actually complete a course of azithromycin and just got worse.  She is [redacted] weeks pregnant.  Denies chest pain, shortness of breath, wheezing.  Her primary concern was having decrease in her pulse oximetry, elevations in her pulse intermittently.  She spoke with her obstetrician today and was advised to come in for an evaluation.  Denies any active symptoms currently.  Symptoms primarily occur when she has a coughing fit.  No history of respiratory disorders.  No current facility-administered medications for this encounter.  Current Outpatient Medications:    azithromycin (ZITHROMAX) 250 MG tablet, Take 2 tabs PO x 1 dose, then 1 tab PO QD x 4 days, Disp: 6 tablet, Rfl: 0   cyclobenzaprine (FLEXERIL) 10 MG tablet, Take 10 mg by mouth 3 (three) times daily as needed for muscle spasms., Disp: , Rfl:    escitalopram (LEXAPRO) 20 MG tablet, Take 20 mg by mouth daily., Disp: , Rfl:    hydrOXYzine (ATARAX/VISTARIL) 25 MG tablet, Take 25 mg by mouth every 8 (eight) hours as needed., Disp: , Rfl:    ondansetron (ZOFRAN ODT) 8 MG disintegrating tablet, Take 1 tablet (8 mg total) by mouth every 8 (eight) hours as needed for nausea or vomiting., Disp: 20 tablet, Rfl: 0   Prenatal Vit-Fe Fumarate-FA (PREPLUS) 27-1 MG TABS, Take by mouth., Disp: , Rfl:    promethazine (PHENERGAN) 25 MG tablet, Take 1 tablet (25 mg total) by mouth every 6 (six) hours as needed for nausea or vomiting., Disp: 30 tablet, Rfl: 0   Allergies  Allergen Reactions   Ampicillin Rash    Has patient had a PCN reaction causing immediate rash, facial/tongue/throat  swelling, SOB or lightheadedness with hypotension: Yes Has patient had a PCN reaction causing severe rash involving mucus membranes or skin necrosis: No Has patient had a PCN reaction that required hospitalization: No Has patient had a PCN reaction occurring within the last 10 years: Yes If all of the above answers are "NO", then may proceed with Cephalosporin use.     Past Medical History:  Diagnosis Date   Anxiety    Headache    PCO (polycystic ovaries)    Renal disorder    kidney stones     Past Surgical History:  Procedure Laterality Date   NO PAST SURGERIES     TENDON REPAIR Right 05/15/2018   Procedure: EXTENSOR TENDON REPAIR RIGHT THUMB;  Surgeon: Knute Neu, MD;  Location: [facility] OR;  Service: Plastics;  Laterality: Right;    Family History  Problem Relation Age of Onset   Healthy Mother    Healthy Father     Social History   Tobacco Use   Smoking status: Never   Smokeless tobacco: Never  Vaping Use   Vaping Use: Never used  Substance Use Topics   Alcohol use: No   Drug use: No    ROS   Objective:   Vitals: BP 114/73 (BP Location: Left Arm)   Pulse 87   Temp 98 F (36.7 C) (Oral)   Resp 18   LMP 07/12/2020   SpO2 98%   Physical Exam Constitutional:  General: She is not in acute distress.    Appearance: Normal appearance. She is well-developed. She is not ill-appearing, toxic-appearing or diaphoretic.  HENT:     Head: Normocephalic and atraumatic.     Right Ear: Tympanic membrane, ear canal and external ear normal. No drainage or tenderness. No middle ear effusion. Tympanic membrane is not erythematous.     Left Ear: Tympanic membrane, ear canal and external ear normal. No drainage or tenderness.  No middle ear effusion. Tympanic membrane is not erythematous.     Nose: Congestion and rhinorrhea present.     Mouth/Throat:     Mouth: Mucous membranes are moist. No oral lesions.     Pharynx: No pharyngeal swelling, oropharyngeal exudate,  posterior oropharyngeal erythema or uvula swelling.     Tonsils: No tonsillar exudate or tonsillar abscesses.     Comments: Significant postnasal drainage overlying pharynx. Eyes:     General: No scleral icterus.       Right eye: No discharge.        Left eye: No discharge.     Extraocular Movements: Extraocular movements intact.     Right eye: Normal extraocular motion.     Left eye: Normal extraocular motion.     Conjunctiva/sclera: Conjunctivae normal.     Pupils: Pupils are equal, round, and reactive to light.  Cardiovascular:     Rate and Rhythm: Normal rate and regular rhythm.     Pulses: Normal pulses.     Heart sounds: Normal heart sounds. No murmur heard.   No friction rub. No gallop.  Pulmonary:     Effort: Pulmonary effort is normal. No respiratory distress.     Breath sounds: Normal breath sounds. No stridor. No wheezing, rhonchi or rales.  Musculoskeletal:     Cervical back: Normal range of motion and neck supple.  Lymphadenopathy:     Cervical: No cervical adenopathy.  Skin:    General: Skin is warm and dry.     Findings: No rash.  Neurological:     General: No focal deficit present.     Mental Status: She is alert and oriented to person, place, and time.  Psychiatric:        Mood and Affect: Mood normal.        Behavior: Behavior normal.        Thought Content: Thought content normal.    Assessment and Plan :   PDMP not reviewed this encounter.  1. Acute non-recurrent sinusitis of other sinus   2. Sinus congestion   3. Cough   4. [redacted] weeks gestation of pregnancy     Patient reports that she has taken amoxicillin without any allergic reactions.  She does have a history of penicillin allergy, ampicillin but has used both amoxicillin and azithromycin without any issues.  As she failed azithromycin, will start empiric treatment for sinusitis with amoxicillin.  Recommended supportive care otherwise including the use of oral antihistamine, decongestant.  Hold the  nasal steroid for now.  Patient was already COVID-19 tested and was negative, deferred testing.  Vital signs and physical exam findings stable for outpatient management.  Follow-up closely with obstetrician.  Counseled patient on potential for adverse effects with medications prescribed/recommended today, ER and return-to-clinic precautions discussed, patient verbalized understanding.    Wallis Bamberg, [place] 10/30/20 1805

## 2020-11-02 ENCOUNTER — Telehealth: Payer: Self-pay

## 2020-11-02 NOTE — Telephone Encounter (Signed)
Referral notes received from [facility] obstetrics gynecology and infertility, Phone #: [phone], Fax #: [phone]   Notes sent to scheduling

## 2020-11-16 ENCOUNTER — Ambulatory Visit: Payer: Self-pay

## 2020-11-16 ENCOUNTER — Encounter (HOSPITAL_COMMUNITY): Payer: Self-pay | Admitting: Obstetrics and Gynecology

## 2020-11-16 ENCOUNTER — Other Ambulatory Visit: Payer: Self-pay

## 2020-11-16 ENCOUNTER — Inpatient Hospital Stay (HOSPITAL_COMMUNITY)
Admission: AD | Admit: 2020-11-16 | Discharge: 2020-11-17 | Disposition: A | Discharge status: Home / Self Care | Payer: PRIVATE HEALTH INSURANCE | Attending: Obstetrics and Gynecology | Admitting: Obstetrics and Gynecology

## 2020-11-16 ENCOUNTER — Inpatient Hospital Stay (HOSPITAL_COMMUNITY): Payer: PRIVATE HEALTH INSURANCE

## 2020-11-16 ENCOUNTER — Telehealth (INDEPENDENT_AMBULATORY_CARE_PROVIDER_SITE_OTHER): Payer: Self-pay | Admitting: Pediatrics

## 2020-11-16 DIAGNOSIS — O99282 Endocrine, nutritional and metabolic diseases complicating pregnancy, second trimester: Secondary | ICD-10-CM | POA: Diagnosis not present

## 2020-11-16 DIAGNOSIS — R200 Anesthesia of skin: Secondary | ICD-10-CM

## 2020-11-16 DIAGNOSIS — O99352 Diseases of the nervous system complicating pregnancy, second trimester: Secondary | ICD-10-CM | POA: Insufficient documentation

## 2020-11-16 DIAGNOSIS — Z3A18 18 weeks gestation of pregnancy: Secondary | ICD-10-CM | POA: Insufficient documentation

## 2020-11-16 DIAGNOSIS — R202 Paresthesia of skin: Secondary | ICD-10-CM | POA: Diagnosis not present

## 2020-11-16 DIAGNOSIS — Z79899 Other long term (current) drug therapy: Secondary | ICD-10-CM | POA: Insufficient documentation

## 2020-11-16 DIAGNOSIS — H5319 Other subjective visual disturbances: Secondary | ICD-10-CM | POA: Diagnosis not present

## 2020-11-16 DIAGNOSIS — G43009 Migraine without aura, not intractable, without status migrainosus: Secondary | ICD-10-CM | POA: Diagnosis not present

## 2020-11-16 DIAGNOSIS — E282 Polycystic ovarian syndrome: Secondary | ICD-10-CM | POA: Insufficient documentation

## 2020-11-16 DIAGNOSIS — G43909 Migraine, unspecified, not intractable, without status migrainosus: Secondary | ICD-10-CM | POA: Insufficient documentation

## 2020-11-16 DIAGNOSIS — Z880 Allergy status to penicillin: Secondary | ICD-10-CM | POA: Insufficient documentation

## 2020-11-16 MED ORDER — METOCLOPRAMIDE HCL 5 MG/ML IJ SOLN
10.0000 mg | Freq: Once | INTRAMUSCULAR | Status: AC
  Administered 2020-11-16: 10 mg via INTRAVENOUS
  Filled 2020-11-16: qty 2

## 2020-11-16 MED ORDER — DEXAMETHASONE SODIUM PHOSPHATE 10 MG/ML IJ SOLN
10.0000 mg | Freq: Once | INTRAMUSCULAR | Status: AC
  Administered 2020-11-16: 10 mg via INTRAVENOUS
  Filled 2020-11-16: qty 1

## 2020-11-16 MED ORDER — SODIUM CHLORIDE 0.9 % IV SOLN: Freq: Once | INTRAVENOUS | Status: AC

## 2020-11-16 MED ORDER — MELATONIN 3 MG PO TABS
3.0000 mg | ORAL_TABLET | Freq: Once | ORAL | Status: AC
  Administered 2020-11-16: 3 mg via ORAL
  Filled 2020-11-16: qty 1

## 2020-11-16 MED ORDER — MAGNESIUM SULFATE 2 GM/50ML IV SOLN
2.0000 g | Freq: Once | INTRAVENOUS | Status: AC
  Administered 2020-11-16: 2 g via INTRAVENOUS
  Filled 2020-11-16: qty 50

## 2020-11-16 MED ORDER — DIPHENHYDRAMINE HCL 50 MG/ML IJ SOLN
25.0000 mg | Freq: Once | INTRAMUSCULAR | Status: AC
  Administered 2020-11-16: 25 mg via INTRAVENOUS
  Filled 2020-11-16: qty 1

## 2020-11-16 NOTE — Progress Notes (Signed)
Pt refusing MRI. RN aware. Pt will be sent back to room

## 2020-11-16 NOTE — MAU Note (Signed)
Pt having migraines and abdominal cramping. Took medication Rizatriptan for migraines yesterday but nothing happening. Also tried combo of tylenol, benadryl and caffiene with no help and OB suggested ibuprofen with also no help. Vomitting from migraine and also having dizziness and visual changes. Pain is 10/10.

## 2020-11-16 NOTE — MAU Provider Note (Signed)
Chief Complaint:  Headache and Abdominal Pain   Event Date/Time   First Provider Initiated Contact with Patient 11/16/20 2023     HPI: Hannah Walker is a 20 y.o. G3P0020 at 5w1dwho presents to maternity admissions reporting migraine headache all day.  Took various meds without relief. .Reports she also has new numbness and tingling in her right arm and spots in her vision in her right eye.  States has never had these symptoms with a headache before   Denies ever having these symptoms before.  States has no sensation on right arm. She denies LOF, vaginal bleeding, vaginal itching/burning, urinary symptoms, dizziness, n/v, diarrhea, constipation or fever/chills.    Headache  This is a new problem. The current episode started yesterday. The problem occurs constantly. The pain does not radiate. The pain quality is similar to prior headaches. The quality of the pain is described as aching and dull. Associated symptoms include abdominal pain, blurred vision (right eye), nausea, a visual change and vomiting. Pertinent negatives include no back pain, dizziness, fever, muscle aches or sinus pressure. Associated symptoms comments: Numbness and tingling right arm, seeing spots in right eye, has never had this with headache. Nothing aggravates the symptoms. Treatments tried: multiple meds. The treatment provided no relief.  Abdominal Pain Chronicity: does not report abdominal cramping to me. Associated symptoms include headaches, nausea and vomiting. Pertinent negatives include no fever.   RN Note: Pt having migraines and abdominal cramping. Took medication Rizatriptan for migraines yesterday but nothing happening. Also tried combo of tylenol, benadryl and caffiene with no help and OB suggested ibuprofen with also no help. Vomitting from migraine and also having dizziness and visual changes. Pain is 10/10.   Past Medical History: Past Medical History:  Diagnosis Date   Anxiety    Headache    PCO  (polycystic ovaries)    Renal disorder    kidney stones    Past obstetric history: OB History  Gravida Para Term Preterm AB Living  3 0 0 0 2 0  SAB IAB Ectopic Multiple Live Births  2 0 0 0 0    # Outcome Date GA Lbr Len/2nd Weight Sex Delivery Anes PTL Lv  3 Current           2 SAB 05/2020          1 SAB 12/2019            Past Surgical History: Past Surgical History:  Procedure Laterality Date   NO PAST SURGERIES     TENDON REPAIR Right 05/15/2018   Procedure: EXTENSOR TENDON REPAIR RIGHT THUMB;  Surgeon: Knute Neu, MD;  Location: [facility] OR;  Service: Plastics;  Laterality: Right;    Family History: Family History  Problem Relation Age of Onset   Healthy Mother    Healthy Father     Social History: Social History   Tobacco Use   Smoking status: Never   Smokeless tobacco: Never  Vaping Use   Vaping Use: Never used  Substance Use Topics   Alcohol use: No   Drug use: No    Allergies:  Allergies  Allergen Reactions   Ampicillin Anaphylaxis    Has patient had a PCN reaction causing immediate rash, facial/tongue/throat swelling, SOB or lightheadedness with hypotension: Yes Has patient had a PCN reaction causing severe rash involving mucus membranes or skin necrosis: No Has patient had a PCN reaction that required hospitalization: No Has patient had a PCN reaction occurring within the last 10 years: Yes  If all of the above answers are "NO", then may proceed with Cephalosporin use.    Penicillins Anaphylaxis    Meds:  Medications Prior to Admission  Medication Sig Dispense Refill Last Dose   escitalopram (LEXAPRO) 20 MG tablet Take 20 mg by mouth daily.   11/15/2020   hydrOXYzine (ATARAX/VISTARIL) 25 MG tablet Take 25 mg by mouth every 8 (eight) hours as needed.   Past Month   ondansetron (ZOFRAN ODT) 8 MG disintegrating tablet Take 1 tablet (8 mg total) by mouth every 8 (eight) hours as needed for nausea or vomiting. 20 tablet 0 11/16/2020   Prenatal Vit-Fe  Fumarate-FA (PREPLUS) 27-1 MG TABS Take by mouth.   11/16/2020   amoxicillin (AMOXIL) 875 MG tablet Take 1 tablet (875 mg total) by mouth 2 (two) times daily. 14 tablet 0    azithromycin (ZITHROMAX) 250 MG tablet Take 2 tabs PO x 1 dose, then 1 tab PO QD x 4 days (Patient not taking: Reported on 10/30/2020) 6 tablet 0    cyclobenzaprine (FLEXERIL) 10 MG tablet Take 10 mg by mouth 3 (three) times daily as needed for muscle spasms.      promethazine (PHENERGAN) 25 MG tablet Take 1 tablet (25 mg total) by mouth every 6 (six) hours as needed for nausea or vomiting. 30 tablet 0     I have reviewed patient's Past Medical Hx, Surgical Hx, Family Hx, Social Hx, medications and allergies.   ROS:  Review of Systems  Constitutional:  Negative for fever.  HENT:  Negative for sinus pressure.        Seeing spots in vision right eye   Eyes:  Positive for blurred vision (right eye).  Gastrointestinal:  Positive for abdominal pain, nausea and vomiting.  Musculoskeletal:  Negative for back pain.       Numbness and tingling right arm   Neurological:  Positive for headaches. Negative for dizziness.  Other systems negative  Physical Exam  Patient Vitals for the past 24 hrs:  BP Temp Temp src Pulse Resp SpO2 Height Weight  11/16/20 1948 108/73 97.9 F (36.6 C) Oral 79 16 -- 5\' 5"  (1.651 m) 72.5 kg  11/16/20 1947 -- -- -- -- -- 99 % -- --   Constitutional: Well-developed, well-nourished female in no acute distress.  Cardiovascular: normal rate and rhythm Respiratory: normal effort, clear to auscultation bilaterally GI: Abd soft, non-tender, gravid appropriate for gestational age.   No rebound or guarding. MS: Extremities nontender, no edema, normal ROM Neurologic: Alert and oriented x 4. Bilateral hand grips and foot flexion strong and equal   Even smile.  No problems with speech or consciousness.  Decreased sensaton right arm.  GU: Neg CVAT.  PELVIC EXAM: deferred   FHT:   156  Labs: No results  found for this or any previous visit (from the past 24 hour(s)). --/--/O POS (05/25 2115)  Imaging:  No results found. Refused MRI and Venogram  MAU Course/MDM: Consult Dr [id number] (Neuro) with presentation, exam findings   He recommends MRI and venogram.  He also recommends migraine cocktail with Magnesium and Melatonin added.  I have ordered MRI of head and MR Venogram per consult.   Treatments in MAU included IV hydration, Reglan, Benadryl, Decadron, Magnesium sulfate and Melatonin.   She agreed to go to MRI for evaluation (her mother was in room and agreed also). Once she got there, she told tech that she had told RN and myself "multiple times" that she did not want the  MRI.   So she came back and told me she had been having arm numbness since injuring her neck and can "get the scans done where I work for free if I need them". Wants to have IV removed and a note to be out of work for 2 days.  Wants to be discharged home.  States headache is better. Discussed the scans were recommended by Neurology Hospitalist and patient states she does not want them and wants to go home despite risks that there may be an undiagnosed CVA.  Assessment: Single IUP at [redacted]w[redacted]d Migraine headache Paresthesia of arm Photopsia  Plan: Discharge home CVA precautions. Follow up in Office for prenatal visits and also in Neuro office for reevaluation Encouraged to return if she develops worsening of symptoms, increase in pain, fever, or other concerning symptoms.  Pt stable at time of discharge.  Wynelle Bourgeois CNM, MSN Certified Nurse-Midwife 11/16/2020 8:24 PM

## 2020-11-16 NOTE — Telephone Encounter (Signed)
Who's calling (name and relationship to patient) : Brandi Azzaro mom   Best contact number: [phone]  Provider they see: Dr. Sharene Skeans  Reason for call: Patient is now pregnant. She has had a headache for a week. The medication is not helping. She is unable to keep anything down because of the pain  Call ID:      PRESCRIPTION REFILL ONLY  Name of prescription:  Pharmacy:

## 2020-11-16 NOTE — Telephone Encounter (Signed)
Will you help with this please?

## 2020-11-16 NOTE — Telephone Encounter (Signed)
Mother called back asking if patient should go to ER for care. She is not improving. Patient gave me verbal permission to speak with mother. Please call mother back at [phone]. Barrington Ellison

## 2020-11-16 NOTE — Telephone Encounter (Signed)
Mom called in before I had a chance to call her and I spoke with her. She said that Hannah Walker is pregnant and she knows that she is limited on what medications that she can take. However she has had a migraine for 4 days, and nothing has given her relief. Mom wanted to know if there was anything to prescribe for her to give relief of the migraine. I explained to Mom that with [name] pregnant and 4 days into a migraine that there is no oral treatment that is safe in pregnancy or would be effective at this point. I recommended that she be seen in ED for treatment. Mom asked if I could call and expedite ED treatment and told her that each patient in the ED was triaged and that my phone call would not expedite that. Mom had no further questions at this time. TG

## 2020-11-16 NOTE — Telephone Encounter (Signed)
I discussed this with Inetta Fermo and agree with this plan.  She has been pregnant since May and has not contacted the office.  I hope that she is not taking any of the medications that we prescribed for migraine and if that is the case, it is one of the reasons why she is having so much trouble.

## 2020-11-20 ENCOUNTER — Telehealth (INDEPENDENT_AMBULATORY_CARE_PROVIDER_SITE_OTHER): Payer: Self-pay | Admitting: Pediatrics

## 2020-11-20 ENCOUNTER — Telehealth (INDEPENDENT_AMBULATORY_CARE_PROVIDER_SITE_OTHER): Payer: Self-pay

## 2020-11-20 DIAGNOSIS — G43009 Migraine without aura, not intractable, without status migrainosus: Secondary | ICD-10-CM

## 2020-11-20 DIAGNOSIS — M542 Cervicalgia: Secondary | ICD-10-CM

## 2020-11-20 DIAGNOSIS — G44219 Episodic tension-type headache, not intractable: Secondary | ICD-10-CM

## 2020-11-20 NOTE — Telephone Encounter (Signed)
  Who's calling (name and relationship to patient) :Mcelveen, Cyndia Diver (Self)  Best contact number: [phone] (Home) Provider they see: Deetta Perla, MD Reason for call: Please contact patient because she is having bad migraines and needs to see an adult neuro. Patient is also [redacted] weeks pregnant. Seeking a referral if possible     PRESCRIPTION REFILL ONLY  Name of prescription:  Pharmacy:

## 2020-11-20 NOTE — Telephone Encounter (Signed)
Routed to [facility]

## 2020-11-20 NOTE — Telephone Encounter (Signed)
Error

## 2020-11-20 NOTE — Telephone Encounter (Signed)
Please place referral

## 2020-11-20 NOTE — Telephone Encounter (Signed)
I called the patient and have made a referral to [facility].

## 2020-12-11 ENCOUNTER — Inpatient Hospital Stay (HOSPITAL_COMMUNITY)
Admission: AD | Admit: 2020-12-11 | Discharge: 2020-12-11 | Disposition: A | Discharge status: Home / Self Care | Payer: PRIVATE HEALTH INSURANCE | Attending: Obstetrics | Admitting: Obstetrics

## 2020-12-11 ENCOUNTER — Inpatient Hospital Stay (HOSPITAL_BASED_OUTPATIENT_CLINIC_OR_DEPARTMENT_OTHER): Payer: PRIVATE HEALTH INSURANCE

## 2020-12-11 ENCOUNTER — Encounter (HOSPITAL_COMMUNITY): Payer: Self-pay | Admitting: Obstetrics

## 2020-12-11 ENCOUNTER — Inpatient Hospital Stay (HOSPITAL_COMMUNITY): Payer: PRIVATE HEALTH INSURANCE

## 2020-12-11 DIAGNOSIS — F419 Anxiety disorder, unspecified: Secondary | ICD-10-CM | POA: Diagnosis not present

## 2020-12-11 DIAGNOSIS — Z79899 Other long term (current) drug therapy: Secondary | ICD-10-CM | POA: Insufficient documentation

## 2020-12-11 DIAGNOSIS — Z888 Allergy status to other drugs, medicaments and biological substances status: Secondary | ICD-10-CM | POA: Insufficient documentation

## 2020-12-11 DIAGNOSIS — O99342 Other mental disorders complicating pregnancy, second trimester: Secondary | ICD-10-CM | POA: Diagnosis not present

## 2020-12-11 DIAGNOSIS — F32A Depression, unspecified: Secondary | ICD-10-CM | POA: Diagnosis not present

## 2020-12-11 DIAGNOSIS — G43009 Migraine without aura, not intractable, without status migrainosus: Secondary | ICD-10-CM

## 2020-12-11 DIAGNOSIS — O99352 Diseases of the nervous system complicating pregnancy, second trimester: Secondary | ICD-10-CM | POA: Diagnosis not present

## 2020-12-11 DIAGNOSIS — O26892 Other specified pregnancy related conditions, second trimester: Secondary | ICD-10-CM | POA: Diagnosis not present

## 2020-12-11 DIAGNOSIS — Z880 Allergy status to penicillin: Secondary | ICD-10-CM | POA: Diagnosis not present

## 2020-12-11 DIAGNOSIS — E282 Polycystic ovarian syndrome: Secondary | ICD-10-CM | POA: Insufficient documentation

## 2020-12-11 DIAGNOSIS — Z3A21 21 weeks gestation of pregnancy: Secondary | ICD-10-CM | POA: Insufficient documentation

## 2020-12-11 DIAGNOSIS — R102 Pelvic and perineal pain unspecified side: Secondary | ICD-10-CM

## 2020-12-11 DIAGNOSIS — R39198 Other difficulties with micturition: Secondary | ICD-10-CM

## 2020-12-11 DIAGNOSIS — Z87442 Personal history of urinary calculi: Secondary | ICD-10-CM | POA: Insufficient documentation

## 2020-12-11 DIAGNOSIS — R000 Tachycardia, unspecified: Secondary | ICD-10-CM

## 2020-12-11 DIAGNOSIS — O99282 Endocrine, nutritional and metabolic diseases complicating pregnancy, second trimester: Secondary | ICD-10-CM | POA: Insufficient documentation

## 2020-12-11 LAB — URINALYSIS, ROUTINE W REFLEX MICROSCOPIC
Bilirubin Urine: NEGATIVE
Glucose, UA: NEGATIVE mg/dL
Hgb urine dipstick: NEGATIVE
Ketones, ur: NEGATIVE mg/dL
Leukocytes,Ua: NEGATIVE
Nitrite: NEGATIVE
Protein, ur: NEGATIVE mg/dL
Specific Gravity, Urine: 1.014 (ref 1.005–1.030)
pH: 6 (ref 5.0–8.0)

## 2020-12-11 LAB — WET PREP, GENITAL
Clue Cells Wet Prep HPF POC: NONE SEEN
Sperm: NONE SEEN
Trich, Wet Prep: NONE SEEN
Yeast Wet Prep HPF POC: NONE SEEN

## 2020-12-11 LAB — POCT FERN TEST: POCT Fern Test: NEGATIVE

## 2020-12-11 MED ORDER — PERSONAL INTERMITTENT CATHETER MISC: 1.0000 [IU] | 0 refills | Status: DC | PRN

## 2020-12-11 MED ORDER — TAMSULOSIN HCL 0.4 MG PO CAPS: 0.4000 mg | ORAL_CAPSULE | Freq: Every day | ORAL | 0 refills | Status: DC

## 2020-12-11 NOTE — Discharge Instructions (Signed)
Safe Medications in Pregnancy    Acne: Benzoyl Peroxide Salicylic Acid  Backache/Headache: Tylenol: 2 regular strength every 4 hours OR              2 Extra strength every 6 hours  Colds/Coughs/Allergies: Benadryl (alcohol free) 25 mg every 6 hours as needed Breath right strips Claritin Cepacol throat lozenges Chloraseptic throat spray Cold-Eeze- up to three times per day Cough drops, alcohol free Flonase (by prescription only) Guaifenesin Mucinex Robitussin DM (plain only, alcohol free) Saline nasal spray/drops Sudafed (pseudoephedrine) & Actifed ** use only after [redacted] weeks gestation and if you do not have high blood pressure Tylenol Vicks Vaporub Zinc lozenges Zyrtec   Constipation: Colace Ducolax suppositories Fleet enema Glycerin suppositories Metamucil Milk of magnesia Miralax Senokot Smooth move tea  Diarrhea: Kaopectate Imodium A-D  *NO pepto Bismol  Hemorrhoids: Anusol Anusol HC Preparation H Tucks  Indigestion: Tums Maalox Mylanta Zantac  Pepcid  Insomnia: Benadryl (alcohol free) 25mg  every 6 hours as needed Tylenol PM Unisom, no Gelcaps  Leg Cramps: Tums MagGel  Nausea/Vomiting:  Bonine Dramamine Emetrol Ginger extract Sea bands Meclizine  - try this once the urinary retention is sorted out Nausea medication to take during pregnancy:  Unisom (doxylamine succinate 25 mg tablets) Take one tablet daily at bedtime. If symptoms are not adequately controlled, the dose can be increased to a maximum recommended dose of two tablets daily (1/2 tablet in the morning, 1/2 tablet mid-afternoon and one at bedtime). Vitamin B6 100mg  tablets. Take one tablet twice a day (up to 200 mg per day).  Skin Rashes: Aveeno products Benadryl cream or 25mg  every 6 hours as needed Calamine Lotion 1% cortisone cream  Yeast infection: Gyne-lotrimin 7 Monistat 7   **If taking multiple medications, please check labels to avoid  duplicating the same active ingredients **take medication as directed on the label ** Do not exceed 4000 mg of tylenol in 24 hours **Do not take medications that contain aspirin or ibuprofen         [facility] Ob/Gyn     Phone: [phone]  [facility] [employer] [facility] [place]  Phone: [phone]  [facility] [place]  Phone: [phone]  [facility] [place]                           Phone: [phone] - Lenzy Kerschner's office  [facility] at [facility]          Phone: [phone]  [facility]    Phone: [phone]   [facility])    Phone: [phone] - in Portal, delivers at [facility] and [facility] - great staff!  [facility]    Phone: [phone]  [facility]    Phone: [phone]  [facility]    Phone: [phone]  [facility]  Phone: [phone]  [phone] Family Practice Center               Phone: [phone]  [facility] [place]   Phone: [phone]  [facility]    Phone: [phone]

## 2020-12-11 NOTE — MAU Provider Note (Signed)
History     CSN: [id number]  Arrival date and time: 12/11/20 1213   Event Date/Time   First Provider Initiated Contact with Patient 12/11/20 1304      Chief Complaint  Patient presents with   Abdominal Pain   Rupture of Membranes   Ms. JO-ANN JOHANNING is a 20 y.o. G3P0020 at [redacted]w[redacted]d who presents to MAU for sharp and cramping pelvic pain and watery discharge that started about 2-3 hours ago. Patient states when she got to work today she did not feel right. Patient states she has been drinking plenty of fluids, but states she has not been able to pee as much as usual. Patient states the sharp shooting pains started about 2-3 hours ago, and then about 30 minutes ago she felt a rush of fluid after bending over that was watery, had a little bit of mucus and was odorless. Patient reports she does not know if she has a retroverted uterus, but might have a "tilted cervix." Patient states she had her anatomy scan on 11/24/2020 and she states everything was normal and the placenta was normal. Patient reports she has had spotting off and on her entire pregnancy and reports the last time she saw spotting was yesterday, but states last intercourse was over one month ago. Patient also reports a lot of pressure in her pelvis that started upon entering the MAU exam room. Patient states the pressure feels like she has to pee.  Pt denies VB, LOF, ctx, vaginal discharge/odor/itching. Pt denies N/V, abdominal pain, constipation, diarrhea, or urinary problems. Pt denies fever, chills, fatigue, sweating or changes in appetite. Pt denies SOB or chest pain. Pt denies dizziness, HA, light-headedness, weakness.  Problems this pregnancy include: nausea and vomiting. Allergies? Ampicillin, PCN Current medications/supplements? Lexapro, Vistaril, Flexeril, Zofran, Pepcid, Unisom PRN, Benadryl PRN, Vitamin B6 Prenatal care provider? [facility]    OB History     Gravida  3   Para  0   Term  0   Preterm   0   AB  2   Living  0      SAB  2   IAB  0   Ectopic  0   Multiple  0   Live Births  0           Past Medical History:  Diagnosis Date   Anxiety    Headache    PCO (polycystic ovaries)    Renal disorder    kidney stones    Past Surgical History:  Procedure Laterality Date   NO PAST SURGERIES     TENDON REPAIR Right 05/15/2018   Procedure: EXTENSOR TENDON REPAIR RIGHT THUMB;  Surgeon: Knute Neu, MD;  Location: [facility] OR;  Service: Plastics;  Laterality: Right;    Family History  Problem Relation Age of Onset   Healthy Mother    Healthy Father     Social History   Tobacco Use   Smoking status: Never   Smokeless tobacco: Never  Vaping Use   Vaping Use: Never used  Substance Use Topics   Alcohol use: No   Drug use: No    Allergies:  Allergies  Allergen Reactions   Ampicillin Anaphylaxis    Has patient had a PCN reaction causing immediate rash, facial/tongue/throat swelling, SOB or lightheadedness with hypotension: Yes Has patient had a PCN reaction causing severe rash involving mucus membranes or skin necrosis: No Has patient had a PCN reaction that required hospitalization: No Has patient had a PCN reaction  occurring within the last 10 years: Yes If all of the above answers are "NO", then may proceed with Cephalosporin use.    Penicillins Anaphylaxis    No medications prior to admission.    Review of Systems  Constitutional:  Negative for chills, diaphoresis, fatigue and fever.  Eyes:  Negative for visual disturbance.  Respiratory:  Negative for shortness of breath.   Cardiovascular:  Negative for chest pain.  Gastrointestinal:  Positive for nausea and vomiting. Negative for abdominal pain, constipation and diarrhea.  Genitourinary:  Positive for decreased urine volume, pelvic pain and vaginal discharge. Negative for dysuria, flank pain, frequency, urgency and vaginal bleeding.  Neurological:  Negative for dizziness, weakness,  light-headedness and headaches.   Physical Exam   Blood pressure 125/67, pulse (!) 102, temperature 98.5 F (36.9 C), temperature source Oral, resp. rate 18, weight 73 kg, last menstrual period 07/12/2020, SpO2 100 %.  Patient Vitals for the past 24 hrs:  BP Temp Temp src Pulse Resp SpO2 Weight  12/11/20 1755 125/67 98.5 F (36.9 C) Oral (!) 102 -- -- --  12/11/20 1240 127/78 -- -- (!) 125 -- 100 % --  12/11/20 1238 -- 98.1 F (36.7 C) -- (!) 123 18 -- --  12/11/20 1228 -- -- -- -- -- -- 73 kg   Physical Exam Exam conducted with a chaperone present.  Constitutional:      General: She is not in acute distress.    Appearance: She is well-developed. She is not diaphoretic.  HENT:     Head: Normocephalic and atraumatic.  Pulmonary:     Effort: Pulmonary effort is normal.  Abdominal:     General: There is no distension.     Palpations: Abdomen is soft. There is no mass.     Tenderness: There is abdominal tenderness in the right lower quadrant, suprapubic area and left lower quadrant. There is no guarding or rebound.     Comments: FH 2 fingerbreadths above umbilicus  Genitourinary:    General: Normal vulva.     Labia:        Right: No rash, tenderness or lesion.        Left: No rash, tenderness or lesion.   Skin:    General: Skin is warm and dry.  Neurological:     Mental Status: She is alert and oriented to person, place, and time.  Psychiatric:        Behavior: Behavior normal.        Thought Content: Thought content normal.        Judgment: Judgment normal.   Results for orders placed or performed during the hospital encounter of 12/11/20 (from the past 24 hour(s))  Fern Test     Status: Normal   Collection Time: 12/11/20  1:12 PM  Result Value Ref Range   POCT Fern Test Negative = intact amniotic membranes   Wet prep, genital     Status: Abnormal   Collection Time: 12/11/20  1:36 PM   Specimen: Vaginal  Result Value Ref Range   Yeast Wet Prep HPF POC NONE SEEN NONE  SEEN   Trich, Wet Prep NONE SEEN NONE SEEN   Clue Cells Wet Prep HPF POC NONE SEEN NONE SEEN   WBC, Wet Prep HPF POC MANY (A) NONE SEEN   Sperm NONE SEEN   Urinalysis, Routine w reflex microscopic     Status: Abnormal   Collection Time: 12/11/20  1:37 PM  Result Value Ref Range   Color, Urine YELLOW  YELLOW   APPearance HAZY (A) CLEAR   Specific Gravity, Urine 1.014 1.005 - 1.030   pH 6.0 5.0 - 8.0   Glucose, UA NEGATIVE NEGATIVE mg/dL   Hgb urine dipstick NEGATIVE NEGATIVE   Bilirubin Urine NEGATIVE NEGATIVE   Ketones, ur NEGATIVE NEGATIVE mg/dL   Protein, ur NEGATIVE NEGATIVE mg/dL   Nitrite NEGATIVE NEGATIVE   Leukocytes,Ua NEGATIVE NEGATIVE   [place] MFM OB LIMITED  Result Date: 12/11/2020 ----------------------------------------------------------------------  OBSTETRICS REPORT                       (Signed Final 12/11/2020 04:28 pm) ---------------------------------------------------------------------- Patient Info  ID #:       [id number]                          D.O.B.:  [birth year] (19 yrs)  Name:       FRANCE LUSTY [facility]                 Visit Date: 12/11/2020 12:50 pm ---------------------------------------------------------------------- Performed By  Attending:        Ma Rings MD         Referred By:      [facility] MAU/Triage  Performed By:     Hurman Horn          [facility] ---------------------------------------------------------------------- Orders  #  Description                           Code        Ordered By  1  [place] MFM OB LIMITED                     32202.54    Taisa Deloria ----------------------------------------------------------------------  #  Order #                     Accession #                Episode #  1  [id number]                   [id number]                 [id number] ---------------------------------------------------------------------- Indications  Pelvic pain affecting pregnancy in  second      O26.892  trimester  [redacted] weeks gestation of pregnancy                Z3A.21 ---------------------------------------------------------------------- Fetal Evaluation  Num Of Fetuses:         1  Fetal Heart Rate(bpm):  162  Cardiac Activity:       Observed  Presentation:           Cephalic  Placenta:               Posterior  P. Cord Insertion:      Visualized, central  Amniotic Fluid  AFI FV:      Within normal limits  Largest Pocket(cm)                              7  Comment:    No placental abruption or previa identified. ---------------------------------------------------------------------- OB History  Gravidity:    3          SAB:   2 ---------------------------------------------------------------------- Gestational Age  LMP:           21w 5d        Date:  07/12/20                 EDD:   04/18/21  Best:          [place]21w 5d     Det. By:  LMP  (07/12/20)          EDD:   04/18/21 ---------------------------------------------------------------------- Cervix Uterus Adnexa  Cervix  Length:           3.53  cm.  Normal appearance by transabdominal scan.  Uterus  No abnormality visualized.  Right Ovary  Within normal limits.  Left Ovary  Within normal limits.  Adnexa  No abnormality visualized. ---------------------------------------------------------------------- Comments  This patient presented to the MAU due to lower abdominal  pain.  A limited ultrasound performed today shows that the fetus is  in the vertex presentation.  There was normal amniotic fluid noted.  A normal-appearing posterior placenta is noted.  Her cervical length measured abdominally was 3.53 cm long  without any signs of funneling.  The views of the fetal anatomy were not assessed on today's  exam. ----------------------------------------------------------------------                   Ma RingsVictor Fang, MD Electronically Signed Final Report   12/11/2020 04:28 pm  ----------------------------------------------------------------------   MAU Course  Procedures  MDM -r/o PTL (lower abdominal/pelvic pain) -UA: hazy, sending urine for culture based on symptoms -CE: long/closed/posterior -FHT: 156 -CL: 3.53cm, no funneling, normal US  -watery vaginal discharge per pt report, x1, no additional episodes of leaking of fluid -normal discharge on glove after cervical exam -fern negative x2 -WetPrep: WNL -GC/CT collected  -difficulty with urination -bladder scan shows ~45950mL of urine -straight cath shows almost 500mL of urine, sent to lab for culture -discomfort improved after straight cath -consulted with Dr. Liliane ShiWinter from urology, advised home straight cath or indwelling catheter with Tamsulosin daily as likelihood of continued retention is high and patient can call urology office tomorrow to schedule for appt ASAP for further evaluation  -vb spotting yesterday, none today, on-going throughout pregnancy per pt report -none on glove after cervix check, normal discharge  -pt requesting medication for nausea and vomiting at same time as eating food from [employer], successfully eating without vomiting -pt advised to discuss further management with OB -pt discharged to home in stable condition  Orders Placed This Encounter  Procedures   Wet prep, genital    Standing Status:   Standing    Number of Occurrences:   1   Culture, OB Urine    Standing Status:   Standing    Number of Occurrences:   1   US MFM OB LIMITED    Please perform cervical length. Can order TVUS if needed for accurate measurement.    Standing Status:   Standing    Number of Occurrences:   1    Order Specific Question:   Symptom/Reason for Exam    Answer:   Pelvic cramping [[id number]]  Urinalysis, Routine w reflex microscopic    Standing Status:   Standing    Number of Occurrences:   1   AMB Referral to [facility]    Referral Priority:   Routine    Referral Type:    Consultation    Referral Reason:   Specialty Services Required    Requested Specialty:   Cardiology    Number of Visits Requested:   1   AMB referral to headache clinic    Referral Priority:   Urgent    Referral Type:   Consultation    Referred to Provider:   Glyn Ade, Scot Jun, PA-C    Number of Visits Requested:   1   Ambulatory referral to Psychiatry    Referral Priority:   Urgent    Referral Type:   Psychiatric    Referral Reason:   Specialty Services Required    Requested Specialty:   Psychiatry    Number of Visits Requested:   1   Bladder scan    Standing Status:   Standing    Number of Occurrences:   1   In and Out Cath    Standing Status:   Standing    Number of Occurrences:   1   Fern Test    Standing Status:   Standing    Number of Occurrences:   1   Discharge patient    Order Specific Question:   Discharge disposition    Answer:   01-Home or Self Care [1]    Order Specific Question:   Discharge patient date    Answer:   12/11/2020   Meds ordered this encounter  Medications   tamsulosin (FLOMAX) 0.4 MG CAPS capsule    Sig: Take 1 capsule (0.4 mg total) by mouth daily.    Dispense:  30 capsule    Refill:  0    Order Specific Question:   Supervising Provider    Answer:   Alysia Penna, MICHAEL L [[id number]]   Catheters (PERSONAL INTERMITTENT CATHETER) MISC    Sig: 1 Units by Does not apply route as needed. Dispense one box of 30 catheters.    Dispense:  1 each    Refill:  0    Order Specific Question:   Supervising Provider    Answer:   Alysia Penna, MICHAEL L [[id number]]    Assessment and Plan   1. Difficulty in urination   2. Pelvic cramping   3. Migraine without aura and without status migrainosus, not intractable   4. Tachycardia   5. Anxiety and depression     Allergies as of 12/11/2020       Reactions   Ampicillin Anaphylaxis   Has patient had a PCN reaction causing immediate rash, facial/tongue/throat swelling, SOB or lightheadedness with hypotension: Yes Has  patient had a PCN reaction causing severe rash involving mucus membranes or skin necrosis: No Has patient had a PCN reaction that required hospitalization: No Has patient had a PCN reaction occurring within the last 10 years: Yes If all of the above answers are "NO", then may proceed with Cephalosporin use.   Penicillins Anaphylaxis        Medication List     TAKE these medications    azithromycin 250 MG tablet Commonly known as: ZITHROMAX Take 2 tabs PO x 1 dose, then 1 tab PO QD x 4 days   cyclobenzaprine 10 MG tablet Commonly known as: FLEXERIL Take 10 mg by mouth 3 (three) times daily as needed for muscle  spasms.   escitalopram 20 MG tablet Commonly known as: LEXAPRO Take 20 mg by mouth daily.   famotidine 10 MG tablet Commonly known as: PEPCID Take 10 mg by mouth 2 (two) times daily.   hydrOXYzine 25 MG tablet Commonly known as: ATARAX/VISTARIL Take 25 mg by mouth every 8 (eight) hours as needed.   ondansetron 8 MG disintegrating tablet Commonly known as: Zofran ODT Take 1 tablet (8 mg total) by mouth every 8 (eight) hours as needed for nausea or vomiting.   Personal Intermittent Catheter Misc 1 Units by Does not apply route as needed. Dispense one box of 30 catheters.   PrePLUS 27-1 MG Tabs Take by mouth.   promethazine 25 MG tablet Commonly known as: PHENERGAN Take 1 tablet (25 mg total) by mouth every 6 (six) hours as needed for nausea or vomiting.   tamsulosin 0.4 MG Caps capsule Commonly known as: FLOMAX Take 1 capsule (0.4 mg total) by mouth daily.        -HA specialist per pt request for additional migraine management -OB Cardio per patient request, reports on-going tachycardia since early pregnancy -psychiatry per discussion with patient who is reporting on-going anxiety and is unable to find anyone to manage her psychiatric medications in pregnancy, pt advised that there is a long wait time for community psychiatric appointments -OB providers  list given as patient considering switching OB providers -safe meds list in pregnancy given, pt advised can try Bonine for further management of nausea and vomiting pending discussion with urology about reason for urinary retention, as this medication could worsen urinary retention -urology information given -work note given for 3 days to allow patient to consult with urology -catheters given from MAU x4, prescription for one box of 30 personal catheters sent to pharmacy -return MAU precautions given -pt discharged to home in stable condition  Joni Reining E Bryler Dibble 12/11/2020, 7:38 PM

## 2020-12-11 NOTE — MAU Note (Signed)
PT reports sharp abdominal pain and cramping which began 2-3 hours ago. It is irregular. About 30 mins ago she began noticing water discharge mixed with mucous. Denies VB.

## 2020-12-12 LAB — CULTURE, OB URINE: Culture: NO GROWTH

## 2020-12-12 LAB — GC/CHLAMYDIA PROBE AMP (~~LOC~~) NOT AT ARMC
Chlamydia: NEGATIVE
Comment: NEGATIVE
Comment: NORMAL
Neisseria Gonorrhea: NEGATIVE

## 2020-12-21 DIAGNOSIS — O210 Mild hyperemesis gravidarum: Secondary | ICD-10-CM

## 2020-12-21 HISTORY — DX: Mild hyperemesis gravidarum: O21.0

## 2020-12-26 ENCOUNTER — Ambulatory Visit: Payer: PRIVATE HEALTH INSURANCE | Admitting: Psychiatry

## 2020-12-26 ENCOUNTER — Encounter: Payer: Self-pay | Admitting: Psychiatry
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
## 2020-12-27 ENCOUNTER — Other Ambulatory Visit: Payer: Self-pay | Admitting: Obstetrics and Gynecology

## 2021-01-11 ENCOUNTER — Other Ambulatory Visit: Payer: Self-pay

## 2021-01-11 ENCOUNTER — Ambulatory Visit
Admission: EM | Admit: 2021-01-11 | Discharge: 2021-01-11 | Disposition: A | Discharge status: Home / Self Care | Payer: No Typology Code available for payment source | Attending: Family Medicine | Admitting: Family Medicine

## 2021-01-11 DIAGNOSIS — R509 Fever, unspecified: Secondary | ICD-10-CM | POA: Diagnosis present

## 2021-01-11 DIAGNOSIS — J069 Acute upper respiratory infection, unspecified: Secondary | ICD-10-CM | POA: Diagnosis present

## 2021-01-11 DIAGNOSIS — H1033 Unspecified acute conjunctivitis, bilateral: Secondary | ICD-10-CM | POA: Diagnosis not present

## 2021-01-11 LAB — POCT RAPID STREP A (OFFICE): Rapid Strep A Screen: NEGATIVE

## 2021-01-11 MED ORDER — AZITHROMYCIN 250 MG PO TABS: ORAL_TABLET | ORAL | 0 refills | Status: DC

## 2021-01-11 MED ORDER — ERYTHROMYCIN 5 MG/GM OP OINT: TOPICAL_OINTMENT | OPHTHALMIC | 0 refills | Status: DC

## 2021-01-11 NOTE — ED Provider Notes (Signed)
[facility]    CSN: [id number] Arrival date & time: 01/11/21  0806      History   Chief Complaint Chief Complaint  Patient presents with   Eye Problem   Facial Pain    HPI Hannah Walker is a 20 y.o. female.   Patient presenting today with 4-day history of bilateral eye pain, redness, drainage, photophobia, sinus pain and pressure, congestion, sore throat, mild cough.  Initially had a fever with T-max of 101.2 F but that is now broken.  Denies abdominal pain, nausea vomiting diarrhea, chest pain, shortness of breath.  So far taking DayQuil and NyQuil with mild temporary relief of symptoms.  Does have a history of seasonal allergies for which she takes Allegra daily.  No known sick contacts recently.   Past Medical History:  Diagnosis Date   Anxiety    Headache    PCO (polycystic ovaries)    Renal disorder    kidney stones    Patient Active Problem List   Diagnosis Date Noted   Bilateral posterior neck pain 06/12/2020   Tired 06/03/2019   History of ovarian cyst 06/03/2019   Nexplanon in place 06/03/2019   LLQ pain 06/03/2019   Irregular bleeding 06/03/2019   Pelvic pain 06/03/2019   Episodic tension-type headache, not intractable 01/07/2018   Migraine without aura and without status migrainosus, not intractable 07/23/2017   New daily persistent headache 02/27/2017   Concussion without loss of consciousness 02/27/2017    Past Surgical History:  Procedure Laterality Date   NO PAST SURGERIES     TENDON REPAIR Right 05/15/2018   Procedure: EXTENSOR TENDON REPAIR RIGHT THUMB;  Surgeon: Knute Neu, MD;  Location: [facility] OR;  Service: Plastics;  Laterality: Right;    OB History     Gravida  3   Para  0   Term  0   Preterm  0   AB  2   Living  0      SAB  2   IAB  0   Ectopic  0   Multiple  0   Live Births  0            Home Medications    Prior to Admission medications   Medication Sig Start Date End Date Taking?  Authorizing Provider  azithromycin (ZITHROMAX) 250 MG tablet Take 2 tabs PO x 1 dose, then 1 tab PO QD x 4 days Patient not taking: Reported on 10/30/2020 10/03/20   Bing Neighbors, FNP  azithromycin (ZITHROMAX) 250 MG tablet Take first 2 tablets together, then 1 every day until finished. 01/11/21   Particia Nearing, PA-C  Catheters (PERSONAL INTERMITTENT CATHETER) MISC 1 Units by Does not apply route as needed. Dispense one box of 30 catheters. 12/11/20   Nugent, Odie Sera, NP  cyclobenzaprine (FLEXERIL) 10 MG tablet Take 10 mg by mouth 3 (three) times daily as needed for muscle spasms.    [provider]  erythromycin ophthalmic ointment Place a 1/2 inch ribbon of ointment into both lower eyelids BID. 01/11/21   Particia Nearing, PA-C  escitalopram (LEXAPRO) 20 MG tablet Take 20 mg by mouth daily. 05/08/20   [provider]  famotidine (PEPCID) 10 MG tablet Take 10 mg by mouth 2 (two) times daily.    [provider]  hydrOXYzine (ATARAX/VISTARIL) 25 MG tablet Take 25 mg by mouth every 8 (eight) hours as needed. 04/11/20   [provider]  ondansetron (ZOFRAN ODT) 8 MG disintegrating  tablet Take 1 tablet (8 mg total) by mouth every 8 (eight) hours as needed for nausea or vomiting. 10/15/20   Rasch, Victorino Dike I, NP  ondansetron (ZOFRAN) 4 MG tablet Take 4 mg by mouth every 8 (eight) hours as needed. 12/09/20   [provider]  Prenatal Vit-Fe Fumarate-FA (PREPLUS) 27-1 MG TABS Take by mouth.    [provider]  promethazine (PHENERGAN) 25 MG tablet Take 1 tablet (25 mg total) by mouth every 6 (six) hours as needed for nausea or vomiting. 08/23/20   Rolm Bookbinder, CNM  tamsulosin (FLOMAX) 0.4 MG CAPS capsule Take 1 capsule (0.4 mg total) by mouth daily. 12/11/20   Nugent, Odie Sera, NP  ESTARYLLA 0.25-35 MG-MCG tablet TK 1 T PO D 04/08/18 02/18/19  [provider]  etonogestrel (NEXPLANON) 68 MG IMPL implant Nexplanon 68 mg subdermal  implant  Inject 1 implant by subcutaneous route.  11/16/19  [provider]  topiramate (TOPAMAX) 25 MG tablet TAKE 4 TABLETS BY MOUTH AT NIGHT TIME 11/17/18 02/18/19  Deetta Perla, MD    Family History Family History  Problem Relation Age of Onset   Healthy Mother    Healthy Father     Social History Social History   Tobacco Use   Smoking status: Never   Smokeless tobacco: Never  Vaping Use   Vaping Use: Never used  Substance Use Topics   Alcohol use: No   Drug use: No     Allergies   Ampicillin and Penicillins   Review of Systems Review of Systems Per HPI  Physical Exam Triage Vital Signs ED Triage Vitals  Enc Vitals Group     BP 01/11/21 0819 118/79     Pulse Rate 01/11/21 0819 (!) 109     Resp 01/11/21 0819 19     Temp 01/11/21 0819 98.4 F (36.9 C)     Temp src --      SpO2 01/11/21 0819 99 %     Weight --      Height --      Head Circumference --      Peak Flow --      Pain Score 01/11/21 0820 4     Pain Loc --      Pain Edu? --      Excl. in GC? --    No data found.  Updated Vital Signs BP 118/79   Pulse (!) 109   Temp 98.4 F (36.9 C)   Resp 19   LMP 07/12/2020   SpO2 99%   Visual Acuity Right Eye Distance:   Left Eye Distance:   Bilateral Distance:    Right Eye Near:   Left Eye Near:    Bilateral Near:     Physical Exam Vitals and nursing note reviewed.  Constitutional:      Appearance: Normal appearance. She is not ill-appearing.  HENT:     Head: Atraumatic.     Right Ear: Tympanic membrane normal.     Left Ear: Tympanic membrane normal.     Nose: Rhinorrhea present.     Mouth/Throat:     Mouth: Mucous membranes are moist.     Pharynx: Posterior oropharyngeal erythema present.  Eyes:     Extraocular Movements: Extraocular movements intact.     Pupils: Pupils are equal, round, and reactive to light.     Comments: Conjunctiva diffusely erythematous b/l. No foreign bodies noted. Clear discharge b/l   Cardiovascular:     Rate and Rhythm: Normal  rate and regular rhythm.     Heart sounds: Normal heart sounds.  Pulmonary:     Effort: Pulmonary effort is normal. No respiratory distress.     Breath sounds: Normal breath sounds. No wheezing or rales.  Musculoskeletal:        General: Normal range of motion.     Cervical back: Normal range of motion and neck supple.  Skin:    General: Skin is warm and dry.  Neurological:     Mental Status: She is alert and oriented to person, place, and time.  Psychiatric:        Mood and Affect: Mood normal.        Thought Content: Thought content normal.        Judgment: Judgment normal.     UC Treatments / Results  Labs (all labs ordered are listed, but only abnormal results are displayed) Labs Reviewed  COVID-19, FLU A+B NAA  CULTURE, GROUP A STREP [facility])  POCT RAPID STREP A (OFFICE)    EKG   Radiology No results found.  Procedures Procedures (including critical care time)  Medications Ordered in UC Medications - No data to display  Initial Impression / Assessment and Plan / UC Course  I have reviewed the triage vital signs and the nursing notes.  Pertinent labs & imaging results that were available during my care of the patient were reviewed by me and considered in my medical decision making (see chart for details).     Rapid strep negative, throat culture and COVID, flu testing all pending.  Suspect viral illness, likely COVID causing her constellation of symptoms.  Mildly tachycardic in triage but otherwise vital signs within normal limits.  Exam overall reassuring and in no acute distress.  Discussed Flonase twice daily, and continued antihistamines, Sudafed as needed, sinus rinses.  Azithromycin sent in case symptoms significantly worsening over the next for 5 days but discussed avoiding if at all possible.  Erythromycin ointment sent over for suspected conjunctivitis though likely viral.  Continue warm compresses.  Return for  acutely worsening symptoms at any time.  Work note given.  Final Clinical Impressions(s) / UC Diagnoses   Final diagnoses:  Viral URI  Fever, unspecified  Acute conjunctivitis of both eyes, unspecified acute conjunctivitis type     Discharge Instructions      I have sent over azithromycin given you are anaphylactic allergy to penicillin drugs.  Try not to take this medication unless you absolutely need to, wait at least 3 to 4 days before starting it if you are not improving.  Take Flonase twice a day, do sinus rinses frequently and continue allergy regimen.     ED Prescriptions     Medication Sig Dispense Auth. Provider   erythromycin ophthalmic ointment  (Status: Discontinued) Place a 1/2 inch ribbon of ointment into both lower eyelids BID. 3.5 g Particia Nearing, PA-C   azithromycin (ZITHROMAX) 250 MG tablet  (Status: Discontinued) Take first 2 tablets together, then 1 every day until finished. 6 tablet Particia Nearing, [place]   erythromycin ophthalmic ointment Place a 1/2 inch ribbon of ointment into both lower eyelids BID. 3.5 g Particia Nearing, PA-C   azithromycin (ZITHROMAX) 250 MG tablet Take first 2 tablets together, then 1 every day until finished. 6 tablet Particia Nearing, [place]      PDMP not reviewed this encounter.   [place], [place], [place] 01/11/21 1013

## 2021-01-11 NOTE — ED Triage Notes (Signed)
Pt presents with complaints of bilateral eye redness and puffiness since Tuesday. Pt reports having sinus infection symptoms that started on Saturday. Endorses cough, nasal congestion, sore throat, and loss of voice. Reports fever of 101.2. denies covid test.

## 2021-01-11 NOTE — Discharge Instructions (Signed)
I have sent over azithromycin given you are anaphylactic allergy to penicillin drugs.  Try not to take this medication unless you absolutely need to, wait at least 3 to 4 days before starting it if you are not improving.  Take Flonase twice a day, do sinus rinses frequently and continue allergy regimen.

## 2021-01-13 LAB — COVID-19, FLU A+B NAA
Influenza A, NAA: NOT DETECTED
Influenza B, NAA: NOT DETECTED
SARS-CoV-2, NAA: NOT DETECTED

## 2021-01-14 LAB — CULTURE, GROUP A STREP (THRC)

## 2021-01-16 ENCOUNTER — Encounter: Payer: Self-pay | Admitting: Internal Medicine

## 2021-01-16 ENCOUNTER — Ambulatory Visit (INDEPENDENT_AMBULATORY_CARE_PROVIDER_SITE_OTHER): Payer: No Typology Code available for payment source | Admitting: Internal Medicine

## 2021-01-16 ENCOUNTER — Other Ambulatory Visit: Payer: Self-pay

## 2021-01-16 VITALS — BP 109/70 | HR 98 | Ht 65.0 in | Wt 167.2 lb

## 2021-01-16 DIAGNOSIS — R000 Tachycardia, unspecified: Secondary | ICD-10-CM | POA: Diagnosis not present

## 2021-01-16 DIAGNOSIS — I959 Hypotension, unspecified: Secondary | ICD-10-CM

## 2021-01-16 DIAGNOSIS — R0602 Shortness of breath: Secondary | ICD-10-CM | POA: Diagnosis not present

## 2021-01-16 DIAGNOSIS — Z3A26 26 weeks gestation of pregnancy: Secondary | ICD-10-CM | POA: Diagnosis not present

## 2021-01-16 NOTE — Patient Instructions (Signed)
Medication Instructions:  NO CHANGES  *If you need a refill on your cardiac medications before your next appointment, please call your pharmacy*   Testing/Procedures: Your physician has requested that you have an echocardiogram. Echocardiography is a painless test that uses sound waves to create images of your heart. It provides your doctor with information about the size and shape of your heart and how well your heart's chambers and valves are working. This procedure takes approximately one hour. There are no restrictions for this procedure. -- done at [facility] - [address] [place]   Follow-Up: At [facility], you and your health needs are our priority.  As part of our continuing mission to provide you with exceptional heart care, we have created designated Provider Care Teams.  These Care Teams include your primary Cardiologist (physician) and Advanced Practice Providers (APPs -  Physician Assistants and Nurse Practitioners) who all work together to provide you with the care you need, when you need it.  We recommend signing up for the patient portal called "MyChart".  Sign up information is provided on this After Visit Summary.  MyChart is used to connect with patients for Virtual Visits (Telemedicine).  Patients are able to view lab/test results, encounter notes, upcoming appointments, etc.  Non-urgent messages can be sent to your provider as well.   To learn more about what you can do with MyChart, go to [email or web address].    Your next appointment:   AS NEEDED with Dr. Rennis Golden

## 2021-01-16 NOTE — Progress Notes (Signed)
OFFICE CONSULT NOTE  Chief Complaint:  Tachycardia, shortness of breath, hypotension  Primary Care Physician: Patient, No Pcp Per (Inactive)  HPI:  Hannah Walker is a 20 y.o. female who is being seen today for the evaluation of tachycardia, shortness of breath, hypotension at the request of Carrington Clamp, MD.  This is a pleasant 20 year old female who is [redacted] weeks pregnant with her first child, a boy, who presents for evaluation of tachycardia, hypotension and shortness of breath  She reports she has had a history of tachycardia which predated her pregnancy however it is worsened during the pregnancy.  She normally has a low normal blood pressure but during her pregnancy has had some blood pressures in the 80s or 90s.  She has had a number of complications during her pregnancy including urinary retention and a renal stone.  She was placed on tamsulosin which also could possibly be causing hypotension.  Hypotension would also increase her risk of tachycardia.  She denies any chest pain but does feel shortness of breath.  She denies any lower extremity edema, orthopnea, PND or other heart failure symptoms such as cough.  She recently had a viral URI which is resolved.  Testing for COVID, flu and strep were all negative.  PMHx:  Past Medical History:  Diagnosis Date   Anxiety    Headache    PCO (polycystic ovaries)    Renal disorder    kidney stones    Past Surgical History:  Procedure Laterality Date   NO PAST SURGERIES     TENDON REPAIR Right 05/15/2018   Procedure: EXTENSOR TENDON REPAIR RIGHT THUMB;  Surgeon: Knute Neu, MD;  Location: [facility] OR;  Service: Plastics;  Laterality: Right;    FAMHx:  Family History  Problem Relation Age of Onset   Healthy Mother    Healthy Father     SOCHx:   reports that she has never smoked. She has never used smokeless tobacco. She reports that she does not drink alcohol and does not use drugs.  ALLERGIES:  Allergies  Allergen  Reactions   Ampicillin Anaphylaxis    Has patient had a PCN reaction causing immediate rash, facial/tongue/throat swelling, SOB or lightheadedness with hypotension: Yes Has patient had a PCN reaction causing severe rash involving mucus membranes or skin necrosis: No Has patient had a PCN reaction that required hospitalization: No Has patient had a PCN reaction occurring within the last 10 years: Yes If all of the above answers are "NO", then may proceed with Cephalosporin use.    Penicillins Anaphylaxis    ROS: Pertinent items noted in HPI and remainder of comprehensive ROS otherwise negative.  HOME MEDS: Current Outpatient Medications on File Prior to Visit  Medication Sig Dispense Refill   Catheters (PERSONAL INTERMITTENT CATHETER) MISC 1 Units by Does not apply route as needed. Dispense one box of 30 catheters. 1 each 0   escitalopram (LEXAPRO) 20 MG tablet Take 20 mg by mouth daily.     famotidine (PEPCID) 10 MG tablet Take 10 mg by mouth 2 (two) times daily.     hydrOXYzine (ATARAX/VISTARIL) 25 MG tablet Take 25 mg by mouth every 8 (eight) hours as needed.     ondansetron (ZOFRAN ODT) 8 MG disintegrating tablet Take 1 tablet (8 mg total) by mouth every 8 (eight) hours as needed for nausea or vomiting. 20 tablet 0   ondansetron (ZOFRAN) 4 MG tablet Take 4 mg by mouth every 8 (eight) hours as needed.  Prenatal Vit-Fe Fumarate-FA (PREPLUS) 27-1 MG TABS Take by mouth.     promethazine (PHENERGAN) 25 MG tablet Take 1 tablet (25 mg total) by mouth every 6 (six) hours as needed for nausea or vomiting. 30 tablet 0   tamsulosin (FLOMAX) 0.4 MG CAPS capsule Take 1 capsule (0.4 mg total) by mouth daily. 30 capsule 0   [DISCONTINUED] ESTARYLLA 0.25-35 MG-MCG tablet TK 1 T PO D     [DISCONTINUED] etonogestrel (NEXPLANON) 68 MG IMPL implant Nexplanon 68 mg subdermal implant  Inject 1 implant by subcutaneous route.     [DISCONTINUED] topiramate (TOPAMAX) 25 MG tablet TAKE 4 TABLETS BY MOUTH AT  NIGHT TIME 124 tablet 0   No current facility-administered medications on file prior to visit.    LABS/IMAGING: No results found for this or any previous visit (from the past 48 hour(s)). No results found.  LIPID PANEL: No results found for: CHOL, TRIG, HDL, CHOLHDL, VLDL, LDLCALC, LDLDIRECT  WEIGHTS: Wt Readings from Last 3 Encounters:  01/16/21 167 lb 3.2 oz (75.8 kg)  12/11/20 161 lb (73 kg)  11/16/20 159 lb 12.8 oz (72.5 kg)    VITALS: BP 109/70   Pulse 98   Ht 5\' 5"  (1.651 m)   Wt 167 lb 3.2 oz (75.8 kg)   LMP 07/12/2020   SpO2 100%   BMI 27.82 kg/m   EXAM: General appearance: alert and no distress Neck: no carotid bruit, no JVD, and thyroid not enlarged, symmetric, no tenderness/mass/nodules Lungs: clear to auscultation bilaterally Heart: regular rate and rhythm, S1, S2 normal, no murmur, click, rub or gallop Abdomen: Gravid uterus Extremities: extremities normal, atraumatic, no cyanosis or edema Pulses: 2+ and symmetric Skin: Skin color, texture, turgor normal. No rashes or lesions Neurologic: Grossly normal Psych: Pleasant  EKG: Normal sinus rhythm at 99, nonspecific T wave changes- personally reviewed  ASSESSMENT: Tachycardia and hypotension [redacted] weeks pregnant Dyspnea on exertion  PLAN: 1.   Hannah Walker has recently had worsening tachycardia and shortness of breath with exertion.  She had some preexistent tachycardia before this and could have inappropriate tachycardia.  Her dyspnea may be related to pregnancy.  I think a cardiomyopathy is unlikely based on her exam and her findings and the fact that she is much earlier in the pregnancy than we would expect for a peripartum cardiomyopathy.  Interestingly, she is on tamsulosin which can have an effect to lower her blood pressure and may be affecting heart rate as well.  She will discuss this with her doctor.  We will obtain an echocardiogram.  If this is reassuring and she can follow-up with me as  needed.  Thanks again for the kind referral.  Nathanial Rancher, MD, [facility]  [place]  [facility]  Medical Director of the [facility]  Card[facility] Diplomate of the American Board of Clinical Lipidology Attending Cardiologist  Direct Dial: [phone]  Fax: [phone]  Website:  [email or web address] Marquelle Musgrave 01/16/2021, 4:31 PM

## 2021-01-29 ENCOUNTER — Other Ambulatory Visit: Payer: Self-pay

## 2021-01-29 ENCOUNTER — Ambulatory Visit (HOSPITAL_COMMUNITY): Payer: Medicaid Other | Attending: Internal Medicine

## 2021-01-29 DIAGNOSIS — I959 Hypotension, unspecified: Secondary | ICD-10-CM

## 2021-01-29 DIAGNOSIS — R0602 Shortness of breath: Secondary | ICD-10-CM

## 2021-01-29 DIAGNOSIS — R000 Tachycardia, unspecified: Secondary | ICD-10-CM | POA: Diagnosis not present

## 2021-01-29 LAB — ECHOCARDIOGRAM COMPLETE
Area-P 1/2: 4.6 cm2
S' Lateral: 3.3 cm

## 2021-02-01 ENCOUNTER — Other Ambulatory Visit: Payer: Self-pay

## 2021-02-01 ENCOUNTER — Encounter (HOSPITAL_COMMUNITY): Payer: Self-pay | Admitting: Obstetrics and Gynecology

## 2021-02-01 ENCOUNTER — Inpatient Hospital Stay (HOSPITAL_COMMUNITY)
Admission: AD | Admit: 2021-02-01 | Discharge: 2021-02-01 | Disposition: A | Discharge status: Home / Self Care | Payer: Medicaid Other | Attending: Obstetrics and Gynecology | Admitting: Obstetrics and Gynecology

## 2021-02-01 DIAGNOSIS — Z880 Allergy status to penicillin: Secondary | ICD-10-CM | POA: Insufficient documentation

## 2021-02-01 DIAGNOSIS — O26893 Other specified pregnancy related conditions, third trimester: Secondary | ICD-10-CM | POA: Diagnosis present

## 2021-02-01 DIAGNOSIS — R0602 Shortness of breath: Secondary | ICD-10-CM | POA: Diagnosis not present

## 2021-02-01 DIAGNOSIS — Z3A29 29 weeks gestation of pregnancy: Secondary | ICD-10-CM | POA: Diagnosis not present

## 2021-02-01 DIAGNOSIS — Z79899 Other long term (current) drug therapy: Secondary | ICD-10-CM | POA: Insufficient documentation

## 2021-02-01 DIAGNOSIS — O26892 Other specified pregnancy related conditions, second trimester: Secondary | ICD-10-CM | POA: Diagnosis not present

## 2021-02-01 DIAGNOSIS — O26899 Other specified pregnancy related conditions, unspecified trimester: Secondary | ICD-10-CM

## 2021-02-01 LAB — COMPREHENSIVE METABOLIC PANEL
ALT: 14 U/L (ref 0–44)
AST: 18 U/L (ref 15–41)
Albumin: 2.8 g/dL — ABNORMAL LOW (ref 3.5–5.0)
Alkaline Phosphatase: 92 U/L (ref 38–126)
Anion gap: 6 (ref 5–15)
BUN: <5 mg/dL — ABNORMAL LOW (ref 6–20)
CO2: 22 mmol/L (ref 22–32)
Calcium: 9.9 mg/dL (ref 8.9–10.3)
Chloride: 106 mmol/L (ref 98–111)
Creatinine, Ser: 0.47 mg/dL (ref 0.44–1.00)
GFR, Estimated: >60 mL/min (ref >60)
Glucose, Bld: 89 mg/dL (ref 70–99)
Potassium: 3.6 mmol/L (ref 3.5–5.1)
Sodium: 134 mmol/L — ABNORMAL LOW (ref 135–145)
Total Bilirubin: 0.5 mg/dL (ref 0.3–1.2)
Total Protein: 6.1 g/dL — ABNORMAL LOW (ref 6.5–8.1)

## 2021-02-01 LAB — HEMOGLOBIN AND HEMATOCRIT, BLOOD
HCT: 34.5 % — ABNORMAL LOW (ref 36.0–46.0)
Hemoglobin: 12 g/dL (ref 12.0–15.0)

## 2021-02-01 LAB — URINALYSIS, ROUTINE W REFLEX MICROSCOPIC
Bilirubin Urine: NEGATIVE
Glucose, UA: NEGATIVE mg/dL
Hgb urine dipstick: NEGATIVE
Ketones, ur: NEGATIVE mg/dL
Leukocytes,Ua: NEGATIVE
Nitrite: NEGATIVE
Protein, ur: NEGATIVE mg/dL
Specific Gravity, Urine: 1.008 (ref 1.005–1.030)
pH: 6 (ref 5.0–8.0)

## 2021-02-01 LAB — LIPASE, BLOOD: Lipase: 29 U/L (ref 11–51)

## 2021-02-01 LAB — TROPONIN I (HIGH SENSITIVITY): Troponin I (High Sensitivity): 4 ng/L (ref <18)

## 2021-02-01 NOTE — MAU Provider Note (Signed)
History     CSN: [id number]  Arrival date and time: 02/01/21 [id number]   Event Date/Time   First Provider Initiated Contact with Patient 02/01/21 0340      Chief Complaint  Patient presents with   Shortness of Breath   HPI Hannah Walker is a 20 y.o. G3P0020 at [redacted]w[redacted]d who presents with shortness of breath. This has been an ongoing issue - saw cardiology last month & had a normal echo earlier this week. Reports symptoms worsened about 9 hours prior to her arrival to MAU. States she feels a chest pressure that makes is difficult to breathe. Symptoms are worse when lying down. Denies fever, cough, sore throat, chest pain.  Also reports lower abdominal cramping & tightening that occurs about once per hour. Denies vaginal bleeding, dysuria, or LOF.  Has had itching of her hands & legs since last month. States had bile acids done last week in the office that were normal; would like them rechecked while here.  Reports good fetal movement.  Next ob appointment with [facility] is in 2 weeks.    OB History     Gravida  3   Para  0   Term  0   Preterm  0   AB  2   Living  0      SAB  2   IAB  0   Ectopic  0   Multiple  0   Live Births  0           Past Medical History:  Diagnosis Date   Anxiety    Headache    PCO (polycystic ovaries)    Renal disorder    kidney stones    Past Surgical History:  Procedure Laterality Date   TENDON REPAIR Right 05/15/2018   Procedure: EXTENSOR TENDON REPAIR RIGHT THUMB;  Surgeon: Dayna Barker, MD;  Location: [place];  Service: Plastics;  Laterality: Right;    Family History  Problem Relation Age of Onset   Healthy Mother    Healthy Father     Social History   Tobacco Use   Smoking status: Never   Smokeless tobacco: Never  Vaping Use   Vaping Use: Never used  Substance Use Topics   Alcohol use: No   Drug use: No    Allergies:  Allergies  Allergen Reactions   Ampicillin Anaphylaxis    Has patient had a PCN  reaction causing immediate rash, facial/tongue/throat swelling, SOB or lightheadedness with hypotension: Yes Has patient had a PCN reaction causing severe rash involving mucus membranes or skin necrosis: No Has patient had a PCN reaction that required hospitalization: No Has patient had a PCN reaction occurring within the last 10 years: Yes If all of the above answers are "NO", then may proceed with Cephalosporin use.    Penicillins Anaphylaxis    Medications Prior to Admission  Medication Sig Dispense Refill Last Dose   escitalopram (LEXAPRO) 20 MG tablet Take 20 mg by mouth daily.   01/31/2021   famotidine (PEPCID) 10 MG tablet Take 10 mg by mouth 2 (two) times daily.   01/31/2021   ondansetron (ZOFRAN) 4 MG tablet Take 4 mg by mouth every 8 (eight) hours as needed.   01/31/2021   Prenatal Vit-Fe Fumarate-FA (PREPLUS) 27-1 MG TABS Take by mouth.   01/31/2021   Catheters (PERSONAL INTERMITTENT CATHETER) MISC 1 Units by Does not apply route as needed. Dispense one box of 30 catheters. 1 each 0 More than a month  hydrOXYzine (ATARAX/VISTARIL) 25 MG tablet Take 25 mg by mouth every 8 (eight) hours as needed.   More than a month   ondansetron (ZOFRAN ODT) 8 MG disintegrating tablet Take 1 tablet (8 mg total) by mouth every 8 (eight) hours as needed for nausea or vomiting. 20 tablet 0 More than a month   promethazine (PHENERGAN) 25 MG tablet Take 1 tablet (25 mg total) by mouth every 6 (six) hours as needed for nausea or vomiting. 30 tablet 0 More than a month   tamsulosin (FLOMAX) 0.4 MG CAPS capsule Take 1 capsule (0.4 mg total) by mouth daily. 30 capsule 0 More than a month    Review of Systems  Constitutional: Negative.   HENT: Negative.    Respiratory:  Positive for chest tightness and shortness of breath. Negative for cough and wheezing.   Cardiovascular: Negative.   Gastrointestinal:  Positive for abdominal pain. Negative for diarrhea, nausea and vomiting.  Genitourinary: Negative.    Skin:        + itching  Physical Exam   Blood pressure 123/75, pulse 96, temperature 98.6 F (37 C), temperature source Oral, resp. rate 18, height 5\' 5"  (1.651 m), last menstrual period 07/12/2020, SpO2 100 %.  Orthostatic VS for the past 24 hrs:  BP- Lying Pulse- Lying BP- Sitting Pulse- Sitting BP- Standing at 0 minutes Pulse- Standing at 0 minutes  02/01/21 0347 114/71 99 115/71 93 125/79 95     Physical Exam Vitals and nursing note reviewed.  Constitutional:      General: She is not in acute distress.    Appearance: She is well-developed. She is not ill-appearing or diaphoretic.  HENT:     Head: Normocephalic and atraumatic.  Cardiovascular:     Rate and Rhythm: Normal rate and regular rhythm.  Pulmonary:     Effort: Pulmonary effort is normal.     Breath sounds: Normal breath sounds. No decreased breath sounds or wheezing.  Chest:     Chest wall: No tenderness.  Genitourinary:    Comments: Dilation: Closed Effacement (%): Thick Cervical Position: Posterior Station: Ballotable Exam by:: [email or web address] NP  Musculoskeletal:     Right lower leg: No edema.     Left lower leg: No edema.  Skin:    General: Skin is warm and dry.     Capillary Refill: Capillary refill takes less than 2 seconds.     Coloration: Skin is not cyanotic.  Neurological:     General: No focal deficit present.     Mental Status: She is alert.  Psychiatric:        Mood and Affect: Mood normal.        Behavior: Behavior normal.   NST:  Baseline: 140 bpm, Variability: Good {> 6 bpm), Accelerations: Reactive, and Decelerations: Absent   MAU Course  Procedures Results for orders placed or performed during the hospital encounter of 02/01/21 (from the past 24 hour(s))  Hemoglobin and hematocrit, blood     Status: Abnormal   Collection Time: 02/01/21  4:21 AM  Result Value Ref Range   Hemoglobin 12.0 12.0 - 15.0 g/dL   HCT 13/03/22 (L) 67.8 - 93.8 %  Comprehensive metabolic panel     Status:  Abnormal   Collection Time: 02/01/21  4:21 AM  Result Value Ref Range   Sodium 134 (L) 135 - 145 mmol/L   Potassium 3.6 3.5 - 5.1 mmol/L   Chloride 106 98 - 111 mmol/L   CO2 22 22 - 32 mmol/L  Glucose, Bld 89 70 - 99 mg/dL   BUN <5 (L) 6 - 20 mg/dL   Creatinine, Ser 0.47 0.44 - 1.00 mg/dL   Calcium 9.9 8.9 - 10.3 mg/dL   Total Protein 6.1 (L) 6.5 - 8.1 g/dL   Albumin 2.8 (L) 3.5 - 5.0 g/dL   AST 18 15 - 41 U/L   ALT 14 0 - 44 U/L   Alkaline Phosphatase 92 38 - 126 U/L   Total Bilirubin 0.5 0.3 - 1.2 mg/dL   GFR, Estimated >60 >60 mL/min   Anion gap 6 5 - 15  Lipase, blood     Status: None   Collection Time: 02/01/21  4:21 AM  Result Value Ref Range   Lipase 29 11 - 51 U/L  Troponin I (High Sensitivity)     Status: None   Collection Time: 02/01/21  4:21 AM  Result Value Ref Range   Troponin I (High Sensitivity) 4 <18 ng/L  Urinalysis, Routine w reflex microscopic     Status: Abnormal   Collection Time: 02/01/21  4:34 AM  Result Value Ref Range   Color, Urine STRAW (A) YELLOW   APPearance CLEAR CLEAR   Specific Gravity, Urine 1.008 1.005 - 1.030   pH 6.0 5.0 - 8.0   Glucose, UA NEGATIVE NEGATIVE mg/dL   Hgb urine dipstick NEGATIVE NEGATIVE   Bilirubin Urine NEGATIVE NEGATIVE   Ketones, ur NEGATIVE NEGATIVE mg/dL   Protein, ur NEGATIVE NEGATIVE mg/dL   Nitrite NEGATIVE NEGATIVE   Leukocytes,Ua NEGATIVE NEGATIVE    MDM SOB - patient states current symptoms are like what she's been experiencing during the pregnancy but worse since yesterday. Also has associated dizziness with symptoms which is not new. Is being followed by cardiology & had a normal echo earlier this week. EKG & labs normal. Normal exam. SpO2 100%. She is in no apparent distress & has normal respiratory effort. Instructed her to f/u with her cardiologist due to worsening symptoms.   Itching - this has been an ongoing issue as well. Had normal bile acids last week in the office. Home interventions not helping  with symptoms. Requesting bile acids to be rechecked while here due to symptoms & family history of cholestasis.   Cramping - reports cramping & tightening that occurs about once per hour. Had some runs of contractions on the monitor but reports not feeling all of them. Cervix is closed/thick/posterior  Assessment and Plan   1. Shortness of breath with pregnancy   2. [redacted] weeks gestation of pregnancy    -patient will call her cardiologist for follow up -reviewed PTL precautions & reasons to return to MAU -keep f/u with ob  Jorje Guild 02/01/2021, 5:46 AM

## 2021-02-01 NOTE — MAU Note (Addendum)
.  Hannah Walker is a 20 y.o. at [redacted]w[redacted]d here in MAU reporting: pt states she has feeling lightheaded/ dizzy since Tuesday during the day. She is currently SOB and "feels like something is sitting on her chest." Pt is also having contractions every hour, rating them 6/10.   Onset of complaint: 01/30/2021 Pain score: 6/10 Vitals:   02/01/21 0324  BP: 123/75  Pulse: 96  Resp: 18  Temp: 98.6 F (37 C)  SpO2: 100%   Lab orders placed from triage:  UA

## 2021-02-01 NOTE — MAU Note (Deleted)
.  hh

## 2021-02-02 LAB — BILE ACIDS, TOTAL: Bile Acids Total: 5.1 umol/L (ref 0.0–10.0)

## 2021-02-16 ENCOUNTER — Institutional Professional Consult (permissible substitution): Payer: No Typology Code available for payment source | Admitting: Physician Assistant

## 2021-04-01 ENCOUNTER — Other Ambulatory Visit: Payer: Self-pay

## 2021-04-01 ENCOUNTER — Encounter (HOSPITAL_COMMUNITY): Payer: Self-pay | Admitting: Obstetrics

## 2021-04-01 ENCOUNTER — Inpatient Hospital Stay (HOSPITAL_COMMUNITY)
Admission: AD | Admit: 2021-04-01 | Discharge: 2021-04-01 | Disposition: A | Discharge status: Home / Self Care | Payer: Medicaid Other | Source: Ambulatory Visit | Attending: Obstetrics | Admitting: Obstetrics

## 2021-04-01 DIAGNOSIS — Z0371 Encounter for suspected problem with amniotic cavity and membrane ruled out: Secondary | ICD-10-CM | POA: Diagnosis present

## 2021-04-01 DIAGNOSIS — Z3A37 37 weeks gestation of pregnancy: Secondary | ICD-10-CM | POA: Insufficient documentation

## 2021-04-01 DIAGNOSIS — O479 False labor, unspecified: Secondary | ICD-10-CM

## 2021-04-01 DIAGNOSIS — Z3689 Encounter for other specified antenatal screening: Secondary | ICD-10-CM

## 2021-04-01 DIAGNOSIS — O471 False labor at or after 37 completed weeks of gestation: Secondary | ICD-10-CM | POA: Insufficient documentation

## 2021-04-01 LAB — POCT FERN TEST: POCT Fern Test: NEGATIVE

## 2021-04-01 MED ORDER — ZOLPIDEM TARTRATE 5 MG PO TABS
5.0000 mg | ORAL_TABLET | Freq: Every evening | ORAL | 0 refills | Status: DC | PRN
Start: 2021-04-01 — End: 2021-04-01

## 2021-04-01 MED ORDER — ZOLPIDEM TARTRATE 5 MG PO TABS
5.0000 mg | ORAL_TABLET | Freq: Every evening | ORAL | 0 refills | Status: DC | PRN
Start: 2021-04-01 — End: 2021-05-20

## 2021-04-01 NOTE — Discharge Instructions (Signed)

## 2021-04-01 NOTE — MAU Provider Note (Signed)
Event Date/Time  First Provider Initiated Contact with Patient 04/01/21 1954     S: [name] is a 21 y.o. G3P0020 at [redacted]w[redacted]d  who presents to MAU  complaining of leaking of fluid since 1645 today. She denies vaginal bleeding. She endorses contractions. She reports normal fetal movement.    O: BP 121/85 (BP Location: Right Arm)    Pulse (!) 103    Temp 98.1 F (36.7 C) (Oral)    Resp 18    Ht 5\' 5"  (1.651 m)    Wt 79.2 kg    LMP 07/12/2020    SpO2 99%    BMI 29.07 kg/m    GENERAL: Well-developed, well-nourished female in no acute distress.  HEAD: Normocephalic, atraumatic.  CHEST: Normal effort of breathing, regular heart rate ABDOMEN: Soft, nontender, gravid PELVIC: Normal external female genitalia. Vagina is pink and rugated. Cervix with normal contour, no lesions. Normal discharge.  Negative pooling.   Cervical exam:  Dilation: 1 Cervical Position: Posterior Exam by:: Maryelizabeth Kaufmann, CNM   Fetal Monitoring: Baseline: 125 Variability: Mod Accelerations: 15 x 15 Decelerations: N/A Contractions: q 2-5 min  Results for orders placed or performed during the hospital encounter of 04/01/21 (from the past 24 hour(s))  POCT fern test     Status: None   Collection Time: 04/01/21  8:16 PM  Result Value Ref Range   POCT Fern Test Negative = intact amniotic membranes      A: SIUP at [redacted]w[redacted]d  Membranes intact (negative pooling, negative fern on SSE) Cervix remains 1 and very posterior Patient declines offer of recheck in one hour Patient declines pain medication in MAU but is agreeable to Ambien rx  P: Discharge home in stable condition  Mallie Snooks, CNM 04/01/2021 9:32 PM

## 2021-04-01 NOTE — MAU Note (Signed)
...  Hannah Walker is a 21 y.o. at [redacted]w[redacted]d here in MAU reporting: Gush of fluid 3 times with a steady flow @ 1645. Ctx and cramping followed and continued about every 8-9 mins. Pt reports spotting which has occurred and OB MD is aware. Pt report HA that has not been relied by Tylenol and reports her normal BP is 90/60 and it has been higher for about a week and half. PT denies VB, DFM, vision changes, epigastric pain, and abnormal swelling. Last intercourse was last week.   Onset of complaint: 1645 Pain score: 6/10 ctx, 3/10 HA Vitals:   04/01/21 1914  BP: 121/85  Pulse: (!) 103  Resp: 18  Temp: 98.1 F (36.7 C)  SpO2: 99%     FHT:155 Lab orders placed from triage:  UA

## 2021-04-01 NOTE — L&D Delivery Note (Addendum)
Delivery Note Hannah Walker is a [id number] at [redacted]w[redacted]d who had a spontaneous delivery at 0054 a viable female ("[name]") was delivered via LOA.  APGAR: 8, 9; weight 8lb1oz (3657g)  .     Admitted for elective induction of labor. Induced with pitocin and AROM. Progressed normally. Received epidural for pain management. Pushed for 30 minutes. Baby was delivered without difficulty. Loose nuchal cord reduced at perineum  Delayed cord clamping for 60 seconds.  Delivery of placenta was spontaneous. Placenta appeared to be intact, 3 -vessel cord was noted. The fundus was found to be firm. 1st degree perineal laceration was repaired in the normal sterile fashion with 3-0 vicryl. DRE with good rectal tone and no sutures.   At this point a steady slow trickle of bright red blood was noted.  Methergine 0.2mg  was given. Dr. Jacquenette Shone (anesthesia) was called and redosed epidural. Foley catheter was replaced. The perineum and vagina were inspected and found to be hemostatic. Exam of the cervix was normal without laceration. Bimanual exam expressed few clots and small pieces of placental membranes. Initially good uterine tone but then became atonic. Cytotec PR and TXA 1g IV were administered  Decision made to proceed to OR for further exam and D&C.   At this point, estimated blood loss 200cc. Instrument and gauze counts were correct at the end of the procedure.  In OR, retained placenta removed under ultrasound guidance with plain curette and suction curette. Due to lower uterine segment atony, Jada device placed. EBL for OR portion was 500cc. Please see operative note for details.    Placenta status: to pathology  Anesthesia:  epidural Episiotomy:  none Lacerations:  1st degree perineal Suture Repair: 3.0 vicryl Est. Blood Loss (mL):  in labor room + in OR  Mom to OR.  Baby to Couplet care / Skin to Skin. Circumcision desired.   Charlett Nose 04/13/2021, 4:36 AM

## 2021-04-03 LAB — OB RESULTS CONSOLE GBS: GBS: NEGATIVE

## 2021-04-05 ENCOUNTER — Inpatient Hospital Stay (HOSPITAL_COMMUNITY)
Admission: AD | Admit: 2021-04-05 | Discharge: 2021-04-05 | Disposition: A | Discharge status: Home / Self Care | Payer: Medicaid Other | Attending: Obstetrics and Gynecology | Admitting: Obstetrics and Gynecology

## 2021-04-05 ENCOUNTER — Encounter (HOSPITAL_COMMUNITY): Payer: Self-pay | Admitting: Obstetrics and Gynecology

## 2021-04-05 ENCOUNTER — Telehealth (HOSPITAL_COMMUNITY): Payer: Self-pay | Admitting: *Deleted

## 2021-04-05 DIAGNOSIS — Z3689 Encounter for other specified antenatal screening: Secondary | ICD-10-CM | POA: Diagnosis not present

## 2021-04-05 DIAGNOSIS — O471 False labor at or after 37 completed weeks of gestation: Secondary | ICD-10-CM | POA: Diagnosis not present

## 2021-04-05 DIAGNOSIS — O479 False labor, unspecified: Secondary | ICD-10-CM | POA: Diagnosis not present

## 2021-04-05 DIAGNOSIS — Z3A38 38 weeks gestation of pregnancy: Secondary | ICD-10-CM | POA: Diagnosis not present

## 2021-04-05 NOTE — Telephone Encounter (Signed)
Preadmission screen  

## 2021-04-05 NOTE — MAU Provider Note (Signed)
Event Date/Time   First Provider Initiated Contact with Patient 04/05/21 2131       S: [name] is a 21 y.o. G3P0020 at [redacted]w[redacted]d  who presents to MAU today complaining of rectal pressure. She noticed this today. Feels like she needs to have BM. Has had several BMs today. The pressure is constant and this concerned her. Reports irregular contractions for the last several days. She denies vaginal bleeding. She reports a clear discharge which she was evaluated for a 4 days ago. She does not think she is ruptured. She reports normal fetal movement.    O: BP 119/72 (BP Location: Right Arm)    Pulse 91    Temp 98 F (36.7 C)    Resp 17    Ht 5\' 5"  (1.651 m)    Wt 79.4 kg    LMP 07/12/2020    SpO2 98%    BMI 29.12 kg/m  GENERAL: Well-developed, well-nourished female in no acute distress.  HEAD: Normocephalic, atraumatic.  CHEST: Normal effort of breathing, regular heart rate ABDOMEN: Soft, nontender, gravid  Cervical exam:  Dilation: 3 Effacement (%): 60 Cervical Position: Posterior Exam by:: [email or web address], RN  Fetal Monitoring: Baseline: 135 Variability: mod Accelerations: + Decelerations: no Contractions: irreg  MDM: Declines exam for ROM or rectal exam. Pressure is likely fetal engagement. Discussed comfort measures. Cervix unchanged, no signs of labor. Stable for discharge home.   A: SIUP at [redacted]w[redacted]d  False labor NST reactive  P: Discharge home Follow up at [facility] as scheduled Labor precautions FMCs  Allergies as of 04/05/2021       Reactions   Ampicillin Anaphylaxis   Has patient had a PCN reaction causing immediate rash, facial/tongue/throat swelling, SOB or lightheadedness with hypotension: Yes Has patient had a PCN reaction causing severe rash involving mucus membranes or skin necrosis: No Has patient had a PCN reaction that required hospitalization: No Has patient had a PCN reaction occurring within the last 10 years: Yes If all of the above answers  are "NO", then may proceed with Cephalosporin use.   Penicillins Anaphylaxis        Medication List     STOP taking these medications    Personal Intermittent Catheter Misc   tamsulosin 0.4 MG Caps capsule Commonly known as: FLOMAX       TAKE these medications    acetaminophen 500 MG tablet Commonly known as: TYLENOL Take 1,000 mg by mouth every 6 (six) hours as needed.   diphenhydrAMINE 25 MG tablet Commonly known as: BENADRYL Take 25 mg by mouth every 6 (six) hours as needed.   escitalopram 20 MG tablet Commonly known as: LEXAPRO Take 20 mg by mouth daily.   famotidine 10 MG tablet Commonly known as: PEPCID Take 10 mg by mouth 2 (two) times daily.   hydrOXYzine 25 MG tablet Commonly known as: ATARAX Take 25 mg by mouth every 8 (eight) hours as needed.   ondansetron 4 MG tablet Commonly known as: ZOFRAN Take 4 mg by mouth every 8 (eight) hours as needed.   ondansetron 8 MG disintegrating tablet Commonly known as: Zofran ODT Take 1 tablet (8 mg total) by mouth every 8 (eight) hours as needed for nausea or vomiting.   PrePLUS 27-1 MG Tabs Take by mouth.   promethazine 25 MG tablet Commonly known as: PHENERGAN Take 1 tablet (25 mg total) by mouth every 6 (six) hours as needed for nausea or vomiting.   zolpidem 5 MG tablet Commonly  known as: AMBIEN Take 1 tablet (5 mg total) by mouth at bedtime as needed for up to 5 days for sleep.         Donette Larry, CNM 04/05/2021 9:44 PM

## 2021-04-05 NOTE — MAU Note (Signed)
Contractions since Sunday night. For last couple hours contractions stronger with a lot of pelvic pressure. Was having watery d/c Sunday night and r/o SROM. Told doctor on Tues and was told to be expected. States is mucousy and watery d/c. Denies VB. Good FM

## 2021-04-06 ENCOUNTER — Encounter (HOSPITAL_COMMUNITY): Payer: Self-pay | Admitting: *Deleted

## 2021-04-11 ENCOUNTER — Encounter (HOSPITAL_COMMUNITY): Payer: Self-pay | Admitting: Obstetrics and Gynecology

## 2021-04-12 ENCOUNTER — Inpatient Hospital Stay (HOSPITAL_COMMUNITY)
Admission: AD | Admit: 2021-04-12 | Discharge: 2021-04-15 | DRG: 768 | Disposition: A | Discharge status: Home / Self Care | Payer: Medicaid Other | Attending: Obstetrics and Gynecology | Admitting: Obstetrics and Gynecology

## 2021-04-12 ENCOUNTER — Inpatient Hospital Stay (HOSPITAL_COMMUNITY): Payer: Medicaid Other

## 2021-04-12 ENCOUNTER — Inpatient Hospital Stay (HOSPITAL_COMMUNITY): Payer: Medicaid Other | Admitting: Anesthesiology

## 2021-04-12 ENCOUNTER — Other Ambulatory Visit: Payer: Self-pay

## 2021-04-12 ENCOUNTER — Encounter (HOSPITAL_COMMUNITY): Payer: Self-pay | Admitting: Obstetrics and Gynecology

## 2021-04-12 DIAGNOSIS — O26893 Other specified pregnancy related conditions, third trimester: Secondary | ICD-10-CM | POA: Diagnosis present

## 2021-04-12 DIAGNOSIS — R531 Weakness: Secondary | ICD-10-CM | POA: Diagnosis not present

## 2021-04-12 DIAGNOSIS — Z3A39 39 weeks gestation of pregnancy: Secondary | ICD-10-CM

## 2021-04-12 DIAGNOSIS — O99344 Other mental disorders complicating childbirth: Secondary | ICD-10-CM | POA: Diagnosis present

## 2021-04-12 DIAGNOSIS — O99893 Other specified diseases and conditions complicating puerperium: Secondary | ICD-10-CM | POA: Diagnosis not present

## 2021-04-12 DIAGNOSIS — F419 Anxiety disorder, unspecified: Secondary | ICD-10-CM | POA: Diagnosis present

## 2021-04-12 DIAGNOSIS — O9081 Anemia of the puerperium: Secondary | ICD-10-CM | POA: Diagnosis not present

## 2021-04-12 DIAGNOSIS — D62 Acute posthemorrhagic anemia: Secondary | ICD-10-CM | POA: Diagnosis not present

## 2021-04-12 DIAGNOSIS — Z20822 Contact with and (suspected) exposure to covid-19: Secondary | ICD-10-CM | POA: Diagnosis present

## 2021-04-12 DIAGNOSIS — Z87891 Personal history of nicotine dependence: Secondary | ICD-10-CM

## 2021-04-12 HISTORY — DX: Depression, unspecified: F32.A

## 2021-04-12 HISTORY — DX: Unspecified abnormal cytological findings in specimens from vagina: R87.629

## 2021-04-12 LAB — CBC
HCT: 34.4 % — ABNORMAL LOW (ref 36.0–46.0)
Hemoglobin: 11.9 g/dL — ABNORMAL LOW (ref 12.0–15.0)
MCH: 30.3 pg (ref 26.0–34.0)
MCHC: 34.6 g/dL (ref 30.0–36.0)
MCV: 87.5 fL (ref 80.0–100.0)
Platelets: 224 10*3/uL (ref 150–400)
RBC: 3.93 MIL/uL (ref 3.87–5.11)
RDW: 13.7 % (ref 11.5–15.5)
WBC: 10.4 10*3/uL (ref 4.0–10.5)
nRBC: 0 % (ref 0.0–0.2)

## 2021-04-12 LAB — RPR: RPR Ser Ql: NONREACTIVE

## 2021-04-12 LAB — TYPE AND SCREEN
ABO/RH(D): O POS
Antibody Screen: NEGATIVE

## 2021-04-12 LAB — RESP PANEL BY RT-PCR (FLU A&B, COVID) ARPGX2
Influenza A by PCR: NEGATIVE
Influenza B by PCR: NEGATIVE
SARS Coronavirus 2 by RT PCR: NEGATIVE

## 2021-04-12 MED ORDER — OXYCODONE-ACETAMINOPHEN 5-325 MG PO TABS: 1.0000 | ORAL_TABLET | ORAL | Status: DC | PRN

## 2021-04-12 MED ORDER — LACTATED RINGERS IV SOLN: INTRAVENOUS | Status: DC

## 2021-04-12 MED ORDER — OXYTOCIN-SODIUM CHLORIDE 30-0.9 UT/500ML-% IV SOLN
1.0000 m[IU]/min | INTRAVENOUS | Status: DC
  Administered 2021-04-12: 2 m[IU]/min via INTRAVENOUS
  Filled 2021-04-12: qty 500

## 2021-04-12 MED ORDER — OXYTOCIN-SODIUM CHLORIDE 30-0.9 UT/500ML-% IV SOLN
2.5000 [IU]/h | INTRAVENOUS | Status: DC
  Filled 2021-04-12: qty 500

## 2021-04-12 MED ORDER — EPHEDRINE 5 MG/ML INJ: 10.0000 mg | INTRAVENOUS | Status: DC | PRN

## 2021-04-12 MED ORDER — FAMOTIDINE 20 MG PO TABS
20.0000 mg | ORAL_TABLET | Freq: Two times a day (BID) | ORAL | Status: DC
  Administered 2021-04-12: 20 mg via ORAL
  Filled 2021-04-12: qty 1

## 2021-04-12 MED ORDER — LACTATED RINGERS IV SOLN: 500.0000 mL | Freq: Once | INTRAVENOUS | Status: DC

## 2021-04-12 MED ORDER — FENTANYL-BUPIVACAINE-NACL 0.5-0.125-0.9 MG/250ML-% EP SOLN
12.0000 mL/h | EPIDURAL | Status: DC | PRN
  Administered 2021-04-12: 12 mL/h via EPIDURAL
  Filled 2021-04-12: qty 250

## 2021-04-12 MED ORDER — SOD CITRATE-CITRIC ACID 500-334 MG/5ML PO SOLN: 30.0000 mL | ORAL | Status: DC | PRN

## 2021-04-12 MED ORDER — ONDANSETRON HCL 4 MG/2ML IJ SOLN
4.0000 mg | Freq: Four times a day (QID) | INTRAMUSCULAR | Status: DC | PRN
  Administered 2021-04-12: 4 mg via INTRAVENOUS
  Filled 2021-04-12 (×2): qty 2

## 2021-04-12 MED ORDER — OXYCODONE-ACETAMINOPHEN 5-325 MG PO TABS: 2.0000 | ORAL_TABLET | ORAL | Status: DC | PRN

## 2021-04-12 MED ORDER — LIDOCAINE-EPINEPHRINE (PF) 2 %-1:200000 IJ SOLN
INTRAMUSCULAR | Status: DC | PRN
  Administered 2021-04-12: 3 mL via EPIDURAL

## 2021-04-12 MED ORDER — LIDOCAINE HCL (PF) 1 % IJ SOLN: 30.0000 mL | INTRAMUSCULAR | Status: DC | PRN

## 2021-04-12 MED ORDER — PHENYLEPHRINE 40 MCG/ML (10ML) SYRINGE FOR IV PUSH (FOR BLOOD PRESSURE SUPPORT): 80.0000 ug | PREFILLED_SYRINGE | INTRAVENOUS | Status: DC | PRN

## 2021-04-12 MED ORDER — LACTATED RINGERS IV SOLN
500.0000 mL | INTRAVENOUS | Status: DC | PRN
  Administered 2021-04-13: 1000 mL via INTRAVENOUS

## 2021-04-12 MED ORDER — DIPHENHYDRAMINE HCL 50 MG/ML IJ SOLN
12.5000 mg | INTRAMUSCULAR | Status: DC | PRN
  Administered 2021-04-12: 12.5 mg via INTRAVENOUS
  Filled 2021-04-12: qty 1

## 2021-04-12 MED ORDER — BUPIVACAINE HCL (PF) 0.25 % IJ SOLN
INTRAMUSCULAR | Status: DC | PRN
  Administered 2021-04-12 (×2): 4 mL via EPIDURAL

## 2021-04-12 MED ORDER — TERBUTALINE SULFATE 1 MG/ML IJ SOLN: 0.2500 mg | Freq: Once | INTRAMUSCULAR | Status: DC | PRN

## 2021-04-12 MED ORDER — FENTANYL CITRATE (PF) 100 MCG/2ML IJ SOLN
50.0000 ug | INTRAMUSCULAR | Status: DC | PRN
  Filled 2021-04-12 (×2): qty 2

## 2021-04-12 MED ORDER — OXYTOCIN BOLUS FROM INFUSION
333.0000 mL | Freq: Once | INTRAVENOUS | Status: AC
  Administered 2021-04-13: 333 mL via INTRAVENOUS

## 2021-04-12 MED ORDER — PHENYLEPHRINE 40 MCG/ML (10ML) SYRINGE FOR IV PUSH (FOR BLOOD PRESSURE SUPPORT)
80.0000 ug | PREFILLED_SYRINGE | INTRAVENOUS | Status: DC | PRN
  Filled 2021-04-12: qty 10

## 2021-04-12 MED ORDER — ACETAMINOPHEN 325 MG PO TABS: 650.0000 mg | ORAL_TABLET | ORAL | Status: DC | PRN
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
## 2021-04-12 NOTE — Anesthesia Preprocedure Evaluation (Signed)
Anesthesia Evaluation  Patient identified by MRN, date of birth, ID band Patient awake    Reviewed: Allergy & Precautions, Patient's Chart, lab work & pertinent test results  History of Anesthesia Complications Negative for: history of anesthetic complications  Airway Mallampati: III  TM Distance: >3 FB Neck ROM: Full    Dental  (+) Dental Advisory Given, Teeth Intact   Pulmonary neg pulmonary ROS, neg recent URI,    breath sounds clear to auscultation       Cardiovascular negative cardio ROS   Rhythm:Regular     Neuro/Psych  Headaches, PSYCHIATRIC DISORDERS Anxiety Depression    GI/Hepatic negative GI ROS, Neg liver ROS,   Endo/Other  negative endocrine ROS  Renal/GU negative Renal ROS     Musculoskeletal   Abdominal   Peds  Hematology negative hematology ROS (+)   Anesthesia Other Findings   Reproductive/Obstetrics (+) Pregnancy                             Anesthesia Physical Anesthesia Plan  ASA: 2  Anesthesia Plan: Epidural   Post-op Pain Management:    Induction:   PONV Risk Score and Plan: 2 and Treatment may vary due to age or medical condition  Airway Management Planned: Natural Airway  Additional Equipment: None  Intra-op Plan:   Post-operative Plan:   Informed Consent: I have reviewed the patients History and Physical, chart, labs and discussed the procedure including the risks, benefits and alternatives for the proposed anesthesia with the patient or authorized representative who has indicated his/her understanding and acceptance.     Dental advisory given  Plan Discussed with: CRNA and Anesthesiologist  Anesthesia Plan Comments:         Anesthesia Quick Evaluation
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
## 2021-04-12 NOTE — Plan of Care (Signed)
°  Problem: Education: Goal: Knowledge of Childbirth will improve Outcome: Progressing   Problem: Education: Goal: Knowledge of General Education information will improve Description: Including pain rating scale, medication(s)/side effects and non-pharmacologic comfort measures Outcome: Progressing   

## 2021-04-12 NOTE — Anesthesia Procedure Notes (Signed)
Epidural Patient location during procedure: OB Start time: 04/12/2021 2:56 PM End time: 04/12/2021 3:11 PM  Staffing Anesthesiologist: Val Eagle, MD Performed: anesthesiologist   Preanesthetic Checklist Completed: patient identified, IV checked, risks and benefits discussed, surgical consent, monitors and equipment checked, pre-op evaluation and timeout performed  Epidural Patient position: sitting Prep: DuraPrep Patient monitoring: heart rate, continuous pulse ox and blood pressure Approach: midline Location: L4-L5 Injection technique: LOR saline  Needle:  Needle type: Tuohy  Needle gauge: 17 G Needle length: 9 cm Needle insertion depth: 7 cm Catheter type: closed end flexible Catheter size: 19 Gauge Catheter at skin depth: 13 cm Test dose: negative and 2% lidocaine with Epi 1:200 K  Assessment Events: blood not aspirated, injection not painful, no injection resistance, no paresthesia and negative IV test

## 2021-04-13 ENCOUNTER — Inpatient Hospital Stay (HOSPITAL_COMMUNITY): Payer: Medicaid Other

## 2021-04-13 ENCOUNTER — Inpatient Hospital Stay (HOSPITAL_COMMUNITY): Payer: Medicaid Other | Admitting: Anesthesiology

## 2021-04-13 ENCOUNTER — Encounter (HOSPITAL_COMMUNITY)
Admission: AD | Disposition: A | Discharge status: Home / Self Care | Payer: Self-pay | Source: Home / Self Care | Attending: Obstetrics and Gynecology

## 2021-04-13 ENCOUNTER — Encounter (HOSPITAL_COMMUNITY): Payer: Self-pay | Admitting: Obstetrics and Gynecology

## 2021-04-13 HISTORY — PX: DILATION AND EVACUATION: SHX1459

## 2021-04-13 LAB — CBC WITH DIFFERENTIAL/PLATELET
Abs Immature Granulocytes: 0.1 10*3/uL — ABNORMAL HIGH (ref 0.00–0.07)
Basophils Absolute: 0 10*3/uL (ref 0.0–0.1)
Basophils Relative: 0 %
Eosinophils Absolute: 0 10*3/uL (ref 0.0–0.5)
Eosinophils Relative: 0 %
HCT: 28.7 % — ABNORMAL LOW (ref 36.0–46.0)
Hemoglobin: 9.8 g/dL — ABNORMAL LOW (ref 12.0–15.0)
Immature Granulocytes: 1 %
Lymphocytes Relative: 4 %
Lymphs Abs: 0.8 10*3/uL (ref 0.7–4.0)
MCH: 30.3 pg (ref 26.0–34.0)
MCHC: 34.1 g/dL (ref 30.0–36.0)
MCV: 88.9 fL (ref 80.0–100.0)
Monocytes Absolute: 0.4 10*3/uL (ref 0.1–1.0)
Monocytes Relative: 2 %
Neutro Abs: 17.5 10*3/uL — ABNORMAL HIGH (ref 1.7–7.7)
Neutrophils Relative %: 93 %
Platelets: 176 10*3/uL (ref 150–400)
RBC: 3.23 MIL/uL — ABNORMAL LOW (ref 3.87–5.11)
RDW: 13.8 % (ref 11.5–15.5)
WBC: 18.8 10*3/uL — ABNORMAL HIGH (ref 4.0–10.5)
nRBC: 0 % (ref 0.0–0.2)

## 2021-04-13 SURGERY — DILATION AND EVACUATION, UTERUS
Anesthesia: Choice

## 2021-04-13 MED ORDER — DIPHENHYDRAMINE HCL 25 MG PO CAPS
25.0000 mg | ORAL_CAPSULE | Freq: Four times a day (QID) | ORAL | Status: DC | PRN
  Administered 2021-04-14: 25 mg via ORAL
  Filled 2021-04-13: qty 1

## 2021-04-13 MED ORDER — METHYLERGONOVINE MALEATE 0.2 MG/ML IJ SOLN
INTRAMUSCULAR | Status: AC
  Administered 2021-04-13: 0.2 mg via INTRAMUSCULAR
  Filled 2021-04-13: qty 1

## 2021-04-13 MED ORDER — LIDOCAINE-EPINEPHRINE (PF) 2 %-1:200000 IJ SOLN
INTRAMUSCULAR | Status: DC | PRN
Start: 2021-04-13 — End: 2021-04-13
  Administered 2021-04-13: 5 mL via EPIDURAL

## 2021-04-13 MED ORDER — COCONUT OIL OIL: 1.0000 "application " | TOPICAL_OIL | Status: DC | PRN

## 2021-04-13 MED ORDER — ONDANSETRON HCL 4 MG/2ML IJ SOLN
4.0000 mg | INTRAMUSCULAR | Status: DC | PRN
  Administered 2021-04-14: 4 mg via INTRAVENOUS
  Filled 2021-04-13: qty 2

## 2021-04-13 MED ORDER — TRANEXAMIC ACID-NACL 1000-0.7 MG/100ML-% IV SOLN
INTRAVENOUS | Status: AC
  Filled 2021-04-13: qty 100

## 2021-04-13 MED ORDER — ONDANSETRON HCL 4 MG/2ML IJ SOLN
INTRAMUSCULAR | Status: AC
  Filled 2021-04-13: qty 2

## 2021-04-13 MED ORDER — IBUPROFEN 600 MG PO TABS
600.0000 mg | ORAL_TABLET | Freq: Four times a day (QID) | ORAL | Status: DC
  Administered 2021-04-13 – 2021-04-15 (×9): 600 mg via ORAL
  Filled 2021-04-13 (×9): qty 1

## 2021-04-13 MED ORDER — OXYCODONE HCL 5 MG PO TABS: 10.0000 mg | ORAL_TABLET | ORAL | Status: DC | PRN

## 2021-04-13 MED ORDER — SIMETHICONE 80 MG PO CHEW: 80.0000 mg | CHEWABLE_TABLET | ORAL | Status: DC | PRN

## 2021-04-13 MED ORDER — METHYLERGONOVINE MALEATE 0.2 MG/ML IJ SOLN
INTRAMUSCULAR | Status: DC | PRN
Start: 2021-04-13 — End: 2021-04-13
  Administered 2021-04-13: .2 mg via INTRAMUSCULAR

## 2021-04-13 MED ORDER — ACETAMINOPHEN 10 MG/ML IV SOLN
1000.0000 mg | Freq: Four times a day (QID) | INTRAVENOUS | Status: DC
  Administered 2021-04-13: 1000 mg via INTRAVENOUS
  Filled 2021-04-13 (×3): qty 100

## 2021-04-13 MED ORDER — ACETAMINOPHEN 325 MG PO TABS
650.0000 mg | ORAL_TABLET | ORAL | Status: DC | PRN
  Administered 2021-04-13 – 2021-04-14 (×3): 650 mg via ORAL
  Filled 2021-04-13 (×3): qty 2

## 2021-04-13 MED ORDER — DEXMEDETOMIDINE (PRECEDEX) IN NS 20 MCG/5ML (4 MCG/ML) IV SYRINGE
PREFILLED_SYRINGE | INTRAVENOUS | Status: AC
  Filled 2021-04-13: qty 5

## 2021-04-13 MED ORDER — LACTATED RINGERS IV SOLN: INTRAVENOUS | Status: DC | PRN

## 2021-04-13 MED ORDER — WITCH HAZEL-GLYCERIN EX PADS: 1.0000 "application " | MEDICATED_PAD | CUTANEOUS | Status: DC | PRN

## 2021-04-13 MED ORDER — SODIUM BICARBONATE 8.4 % IV SOLN
INTRAVENOUS | Status: DC | PRN
  Administered 2021-04-13: 8 mL via EPIDURAL

## 2021-04-13 MED ORDER — METHYLERGONOVINE MALEATE 0.2 MG/ML IJ SOLN
INTRAMUSCULAR | Status: AC
  Filled 2021-04-13: qty 1

## 2021-04-13 MED ORDER — MIDAZOLAM HCL 2 MG/2ML IJ SOLN
INTRAMUSCULAR | Status: AC
  Filled 2021-04-13: qty 2

## 2021-04-13 MED ORDER — CLINDAMYCIN PHOSPHATE 900 MG/50ML IV SOLN
INTRAVENOUS | Status: AC
  Filled 2021-04-13: qty 50

## 2021-04-13 MED ORDER — PROMETHAZINE HCL 25 MG/ML IJ SOLN
INTRAMUSCULAR | Status: AC
  Filled 2021-04-13: qty 1

## 2021-04-13 MED ORDER — DOCUSATE SODIUM 100 MG PO CAPS
100.0000 mg | ORAL_CAPSULE | Freq: Two times a day (BID) | ORAL | Status: DC
  Administered 2021-04-14 – 2021-04-15 (×3): 100 mg via ORAL
  Filled 2021-04-13 (×3): qty 1

## 2021-04-13 MED ORDER — MEPERIDINE HCL 25 MG/ML IJ SOLN
INTRAMUSCULAR | Status: AC
  Filled 2021-04-13: qty 1

## 2021-04-13 MED ORDER — DIBUCAINE (PERIANAL) 1 % EX OINT: 1.0000 "application " | TOPICAL_OINTMENT | CUTANEOUS | Status: DC | PRN

## 2021-04-13 MED ORDER — OXYCODONE HCL 5 MG PO TABS: 5.0000 mg | ORAL_TABLET | Freq: Once | ORAL | Status: DC | PRN

## 2021-04-13 MED ORDER — MEPERIDINE HCL 25 MG/ML IJ SOLN
INTRAMUSCULAR | Status: DC | PRN
  Administered 2021-04-13 (×2): 12.5 mg via INTRAVENOUS

## 2021-04-13 MED ORDER — PRENATAL MULTIVITAMIN CH
1.0000 | ORAL_TABLET | Freq: Every day | ORAL | Status: DC
  Administered 2021-04-13 – 2021-04-14 (×2): 1 via ORAL
  Filled 2021-04-13 (×2): qty 1

## 2021-04-13 MED ORDER — MISOPROSTOL 200 MCG PO TABS
1000.0000 ug | ORAL_TABLET | Freq: Once | ORAL | Status: AC
  Administered 2021-04-13: 1000 ug via RECTAL

## 2021-04-13 MED ORDER — METHYLERGONOVINE MALEATE 0.2 MG PO TABS
0.2000 mg | ORAL_TABLET | Freq: Four times a day (QID) | ORAL | Status: AC
  Administered 2021-04-13 (×4): 0.2 mg via ORAL
  Filled 2021-04-13 (×4): qty 1

## 2021-04-13 MED ORDER — DEXAMETHASONE SODIUM PHOSPHATE 10 MG/ML IJ SOLN
INTRAMUSCULAR | Status: AC
  Filled 2021-04-13: qty 1

## 2021-04-13 MED ORDER — FLEET ENEMA 7-19 GM/118ML RE ENEM: 1.0000 | ENEMA | Freq: Every day | RECTAL | Status: DC | PRN

## 2021-04-13 MED ORDER — MISOPROSTOL 200 MCG PO TABS
ORAL_TABLET | ORAL | Status: AC
  Filled 2021-04-13: qty 5

## 2021-04-13 MED ORDER — TRANEXAMIC ACID-NACL 1000-0.7 MG/100ML-% IV SOLN
1000.0000 mg | Freq: Once | INTRAVENOUS | Status: AC
  Administered 2021-04-13: 1000 mg via INTRAVENOUS

## 2021-04-13 MED ORDER — MEPERIDINE HCL 25 MG/ML IJ SOLN: 6.2500 mg | INTRAMUSCULAR | Status: DC | PRN

## 2021-04-13 MED ORDER — ONDANSETRON HCL 4 MG/2ML IJ SOLN
INTRAMUSCULAR | Status: DC | PRN
Start: 2021-04-13 — End: 2021-04-13
  Administered 2021-04-13: 4 mg via INTRAVENOUS

## 2021-04-13 MED ORDER — BISACODYL 10 MG RE SUPP: 10.0000 mg | Freq: Every day | RECTAL | Status: DC | PRN

## 2021-04-13 MED ORDER — HYDROMORPHONE HCL 1 MG/ML IJ SOLN: 0.2500 mg | INTRAMUSCULAR | Status: DC | PRN

## 2021-04-13 MED ORDER — METHYLERGONOVINE MALEATE 0.2 MG/ML IJ SOLN: 0.2000 mg | Freq: Once | INTRAMUSCULAR | Status: AC

## 2021-04-13 MED ORDER — STERILE WATER FOR IRRIGATION IR SOLN
Status: DC | PRN
  Administered 2021-04-13: 1000 mL

## 2021-04-13 MED ORDER — ONDANSETRON HCL 4 MG PO TABS
4.0000 mg | ORAL_TABLET | ORAL | Status: DC | PRN
  Administered 2021-04-13 – 2021-04-14 (×2): 4 mg via ORAL
  Filled 2021-04-13 (×2): qty 1

## 2021-04-13 MED ORDER — DEXAMETHASONE SODIUM PHOSPHATE 10 MG/ML IJ SOLN
INTRAMUSCULAR | Status: DC | PRN
Start: 2021-04-13 — End: 2021-04-13
  Administered 2021-04-13: 10 mg via INTRAVENOUS

## 2021-04-13 MED ORDER — OXYCODONE HCL 5 MG PO TABS
5.0000 mg | ORAL_TABLET | ORAL | Status: DC | PRN
  Administered 2021-04-14 – 2021-04-15 (×2): 5 mg via ORAL
  Filled 2021-04-13 (×2): qty 1

## 2021-04-13 MED ORDER — LIDOCAINE-EPINEPHRINE (PF) 2 %-1:200000 IJ SOLN
INTRAMUSCULAR | Status: AC
  Filled 2021-04-13: qty 20

## 2021-04-13 MED ORDER — DEXMEDETOMIDINE (PRECEDEX) IN NS 20 MCG/5ML (4 MCG/ML) IV SYRINGE
PREFILLED_SYRINGE | INTRAVENOUS | Status: DC | PRN
  Administered 2021-04-13: 12 ug via INTRAVENOUS
  Administered 2021-04-13 (×2): 8 ug via INTRAVENOUS
  Administered 2021-04-13: 12 ug via INTRAVENOUS

## 2021-04-13 MED ORDER — TETANUS-DIPHTH-ACELL PERTUSSIS 5-2.5-18.5 LF-MCG/0.5 IM SUSY: 0.5000 mL | PREFILLED_SYRINGE | Freq: Once | INTRAMUSCULAR | Status: DC

## 2021-04-13 MED ORDER — SERTRALINE HCL 25 MG PO TABS
25.0000 mg | ORAL_TABLET | Freq: Every day | ORAL | Status: DC
  Administered 2021-04-13 – 2021-04-15 (×3): 25 mg via ORAL
  Filled 2021-04-13 (×3): qty 1

## 2021-04-13 MED ORDER — CLINDAMYCIN PHOSPHATE 900 MG/50ML IV SOLN
900.0000 mg | Freq: Once | INTRAVENOUS | Status: AC
  Administered 2021-04-13: 900 mg via INTRAVENOUS

## 2021-04-13 MED ORDER — BENZOCAINE-MENTHOL 20-0.5 % EX AERO
1.0000 "application " | INHALATION_SPRAY | CUTANEOUS | Status: DC | PRN
  Administered 2021-04-14: 1 via TOPICAL
  Filled 2021-04-13: qty 56

## 2021-04-13 MED ORDER — FAMOTIDINE 20 MG PO TABS
10.0000 mg | ORAL_TABLET | Freq: Two times a day (BID) | ORAL | Status: DC
  Administered 2021-04-13: 10 mg via ORAL
  Administered 2021-04-13: 20 mg via ORAL
  Administered 2021-04-14 (×2): 10 mg via ORAL
  Filled 2021-04-13 (×6): qty 1

## 2021-04-13 MED ORDER — OXYCODONE HCL 5 MG/5ML PO SOLN
5.0000 mg | Freq: Once | ORAL | Status: DC | PRN
Start: 2021-04-13 — End: 2021-04-13

## 2021-04-13 MED ORDER — PHENYLEPHRINE HCL (PRESSORS) 10 MG/ML IV SOLN
INTRAVENOUS | Status: DC | PRN
Start: 2021-04-13 — End: 2021-04-13
  Administered 2021-04-13: 80 ug via INTRAVENOUS

## 2021-04-13 MED ORDER — FENTANYL CITRATE (PF) 100 MCG/2ML IJ SOLN
INTRAMUSCULAR | Status: DC | PRN
  Administered 2021-04-13: 100 ug via EPIDURAL

## 2021-04-13 MED ORDER — PROMETHAZINE HCL 25 MG/ML IJ SOLN: 6.2500 mg | INTRAMUSCULAR | Status: DC | PRN

## 2021-04-13 MED ORDER — MIDAZOLAM HCL 5 MG/5ML IJ SOLN
INTRAMUSCULAR | Status: DC | PRN
Start: 2021-04-13 — End: 2021-04-13
  Administered 2021-04-13 (×2): 2 mg via INTRAVENOUS

## 2021-04-13 SURGICAL SUPPLY — 12 items
CATH ROBINSON RED A/P 16FR (CATHETERS) ×1 IMPLANT
DRSG TELFA 3X8 NADH (GAUZE/BANDAGES/DRESSINGS) ×2 IMPLANT
GLOVE SURG UNDER POLY LF SZ6.5 (GLOVE) ×2 IMPLANT
GOWN STRL REUS W/ TWL LRG LVL3 (GOWN DISPOSABLE) IMPLANT
GOWN STRL REUS W/TWL LRG LVL3 (GOWN DISPOSABLE) ×4
PACK VAGINAL MINOR WOMEN LF (CUSTOM PROCEDURE TRAY) ×1 IMPLANT
PAD DRESSING TELFA 3X8 NADH (GAUZE/BANDAGES/DRESSINGS) IMPLANT
PAD OB MATERNITY 4.3X12.25 (PERSONAL CARE ITEMS) ×2 IMPLANT
PAD PREP 24X48 CUFFED NSTRL (MISCELLANEOUS) ×1 IMPLANT
SET BERKELEY SUCTION TUBING (SUCTIONS) ×1 IMPLANT
TOWEL OR 17X24 6PK STRL BLUE (TOWEL DISPOSABLE) ×2 IMPLANT
VACURETTE 12 RIGID CVD (CANNULA) ×1 IMPLANT

## 2021-04-13 NOTE — Lactation Note (Signed)
This note was copied from a baby's chart. Lactation Consultation Note  Patient Name: Hannah Walker IRCVE'L Date: 04/13/2021 Reason for consult: Initial assessment;Primapara;1st time breastfeeding;Term;Maternal endocrine disorder Age:23 hours  Visited with mom of 7 hours old FT female, she's a P1 and reported (+++) breast changes during the pregnancy. LC assisted with hand expression (colostrum easily noted) and latching, baby latched on with ease STS to the left breast in cross cradle hold, he fed for 12 minutes, then he released himself from the breast (see LATCH score). He's still a little spitty, showed parents how to burp baby.  Reviewed normal newborn behavior, feeding cues, lactogenesis II, expectations and size of baby's stomach.   Maternal Data Has patient been taught Hand Expression?: Yes Does the patient have breastfeeding experience prior to this delivery?: No  Feeding Mother's Current Feeding Choice: Breast Milk  LATCH Score Latch: Grasps breast easily, tongue down, lips flanged, rhythmical sucking.  Audible Swallowing: A few with stimulation  Type of Nipple: Everted at rest and after stimulation (short shafted but tissue is compressible)  Comfort (Breast/Nipple): Soft / non-tender  Hold (Positioning): Assistance needed to correctly position infant at breast and maintain latch.  LATCH Score: 8  Interventions Interventions: Breast feeding basics reviewed;Breast massage;Hand express;Support pillows;Adjust position;Breast compression;Education;LC Services brochure  Plan of care  Encouraged mom to continue putting baby to breast 8-12 times/24 hours or sooner if feeding cues are present Hand expression and finger/spoon feeding were also encouraged.  FOB present and supportive. All questions and concerns answered, family to call LC PRN.  Discharge Pump: Personal (DEBP at home)  Consult Status Consult Status: Follow-up Date: 04/14/21 Follow-up type:  In-patient   Hannah Walker 04/13/2021, 8:36 AM

## 2021-04-13 NOTE — Progress Notes (Signed)
Jada suction has been off for > 1 hour without additional vaginal bleeding Balloon deflated and Jada removed without incident. Subsequent fundal rub demonstrated firm uterus with scant lochia.  Will complete PO methergine series x 24 hours. Patient feels tired but well.   CBC pending. Can remove foley.   Today's Vitals   04/13/21 0530 04/13/21 0533 04/13/21 0623 04/13/21 0625  BP: 117/84  112/72   Pulse: (!) 109  100   Resp: 18  18   Temp: 98.2 F (36.8 C)  98.8 F (37.1 C)   TempSrc: Oral  Oral   SpO2: 97%  97%   Weight:      Height:      PainSc:  0-No pain  0-No pain   Body mass index is 28.56 kg/m.  Luther Redo, MD 04/13/21 6:57 AM

## 2021-04-13 NOTE — Plan of Care (Signed)
Problem: Education: Goal: Ability to state and carry out methods to decrease the pain will improve Outcome: Completed/Met Goal: Individualized Educational Video(s) Outcome: Completed/Met   Problem: Coping: Goal: Ability to verbalize concerns and feelings about labor and delivery will improve Outcome: Completed/Met   Problem: Life Cycle: Goal: Ability to make normal progression through stages of labor will improve Outcome: Completed/Met Goal: Ability to effectively push during vaginal delivery will improve Outcome: Completed/Met   Problem: Role Relationship: Goal: Will demonstrate positive interactions with the child Outcome: Completed/Met   Problem: Safety: Goal: Risk of complications during labor and delivery will decrease Outcome: Completed/Met   Problem: Pain Management: Goal: Relief or control of pain from uterine contractions will improve Outcome: Completed/Met   Problem: Education: Goal: Knowledge of General Education information will improve Description: Including pain rating scale, medication(s)/side effects and non-pharmacologic comfort measures Outcome: Completed/Met   Problem: Health Behavior/Discharge Planning: Goal: Ability to manage health-related needs will improve Outcome: Completed/Met   Problem: Clinical Measurements: Goal: Ability to maintain clinical measurements within normal limits will improve Outcome: Completed/Met Goal: Will remain free from infection Outcome: Completed/Met Goal: Diagnostic test results will improve Outcome: Completed/Met Goal: Respiratory complications will improve Outcome: Completed/Met Goal: Cardiovascular complication will be avoided Outcome: Completed/Met   Problem: Activity: Goal: Risk for activity intolerance will decrease Outcome: Completed/Met   Problem: Nutrition: Goal: Adequate nutrition will be maintained Outcome: Completed/Met   Problem: Coping: Goal: Level of anxiety will decrease Outcome:  Completed/Met   Problem: Elimination: Goal: Will not experience complications related to bowel motility Outcome: Completed/Met Goal: Will not experience complications related to urinary retention Outcome: Completed/Met   Problem: Pain Managment: Goal: General experience of comfort will improve Outcome: Completed/Met   Problem: Safety: Goal: Ability to remain free from injury will improve Outcome: Completed/Met   Problem: Skin Integrity: Goal: Risk for impaired skin integrity will decrease Outcome: Completed/Met

## 2021-04-13 NOTE — Anesthesia Postprocedure Evaluation (Signed)
Anesthesia Post Note  Patient: [name]  Procedure(s) Performed: AN AD HOC LABOR EPIDURAL     Patient location during evaluation: PACU Anesthesia Type: Epidural Level of consciousness: awake and alert and oriented Pain management: pain level controlled Vital Signs Assessment: post-procedure vital signs reviewed and stable Respiratory status: spontaneous breathing, nonlabored ventilation and respiratory function stable Cardiovascular status: blood pressure returned to baseline and stable Postop Assessment: no headache, no backache, epidural receding and no apparent nausea or vomiting Anesthetic complications: no   No notable events documented.  Last Vitals:  Vitals:   04/13/21 0530 04/13/21 0623  BP: 117/84 112/72  Pulse: (!) 109 100  Resp: 18 18  Temp: 36.8 C 37.1 C  SpO2: 97% 97%    Last Pain:  Vitals:   04/13/21 0625  TempSrc:   PainSc: 0-No pain                 [name]

## 2021-04-13 NOTE — Social Work (Signed)
CSW attempted to meet with MOB to assess for resources. Family currently visiting, will follow-up prior to D/C. ° °Nimai Burbach, MSW, LCSWA °Clinical Social Work °[facility] °[phone] °

## 2021-04-13 NOTE — Anesthesia Preprocedure Evaluation (Signed)
Anesthesia Evaluation  Patient identified by MRN, date of birth, ID band Patient awake    Reviewed: Allergy & Precautions, Patient's Chart, lab work & pertinent test results  History of Anesthesia Complications Negative for: history of anesthetic complications  Airway Mallampati: III  TM Distance: >3 FB Neck ROM: Full    Dental  (+) Dental Advisory Given, Teeth Intact   Pulmonary neg pulmonary ROS, neg recent URI, former smoker,    breath sounds clear to auscultation       Cardiovascular negative cardio ROS   Rhythm:Regular     Neuro/Psych  Headaches, PSYCHIATRIC DISORDERS Anxiety Depression    GI/Hepatic negative GI ROS, Neg liver ROS,   Endo/Other  negative endocrine ROS  Renal/GU negative Renal ROS     Musculoskeletal   Abdominal   Peds  Hematology negative hematology ROS (+)   Anesthesia Other Findings   Reproductive/Obstetrics                             Anesthesia Physical Anesthesia Plan  ASA: 3 and emergent  Anesthesia Plan: Epidural   Post-op Pain Management:    Induction:   PONV Risk Score and Plan: 2  Airway Management Planned: Natural Airway  Additional Equipment: None  Intra-op Plan:   Post-operative Plan:   Informed Consent: I have reviewed the patients History and Physical, chart, labs and discussed the procedure including the risks, benefits and alternatives for the proposed anesthesia with the patient or authorized representative who has indicated his/her understanding and acceptance.       Plan Discussed with: CRNA  Anesthesia Plan Comments:         Anesthesia Quick Evaluation

## 2021-04-13 NOTE — Op Note (Signed)
Operative Note  Patient: Hannah Walker is a 21 y.o. G3P0020 @ [redacted]w[redacted]d  Preoperative Diagnosis: Abnormal postpartum bleeding, suspected retained placenta, uterine atony  Postoperative Diagnosis: Abnormal postpartum bleeding, retained placenta, lower uterine segment atony  Procedure: Exam in operating room, D&C of retained placenta under ultrasound guidance, placement of Jada device     Surgeon: Charlett Nose , MD  Assistant: Dr. Tinnie Gens was present for assistance with [facility] device placement  Anesthesia: Epidural anesthesia   Findings: Vaginal walls and cervix inspected and noted to be hemostatic On ultrasound, evidence of retained placenta. Once retained placenta removed, lower uterine segment atony appreciated.  Estimated Blood Loss:          Specimens: Retained pieces of placenta         Complications:  None         Disposition: PACU - hemodynamically stable.         Condition: stable    Description of Procedure: The patient was appropriately consented and taken to the operating room.  The patient was placed in the dorsal supine position. A foley catheter was in place and draining.  The patient was subsequently prepped and draped in the normal sterile fashion.    Using Sims retractors, the vaginal walls were thoroughly inspected and noted to be hemostatic. A weighted speculum was inserted. The cervix was inspected circumferentially and noted to be hemostatic. A large curette was then inserted into the uterus and curettage performed gently. A portion of placenta was removed. At this time, intraoperative ultrasound guidance was requested. Using ultrasound guidance, the remained of the retained placenta pieces were removed with a large curette. A 36mm suction curette was then inserted to remove the remaining placenta remnants and blood clots. Specimens sent for pathology. Additional methergine 0.2mg  IM was given. The uterine fundus responded with excellent tone and was  monitored with ultrasound. The lower uterine segment was evacuated of clot but then noted to immediately refill on ultrasound. Due to lower uterine segment atony, decision was made to place the [facility] device. I requested assistance of Dr. Shawnie Pons for [facility] placement. The Jada device was inserted into the uterine cavity. The vaginal balloon was inflated with 120cc and the Mel Almond was connected to wall suction. Minimal amount of blood filled the tubing and then no additional blood was noted.   The patient was taken to the recovery room in stable condition. Clindamycin 900mg  IV was administered for infection prophylaxis.   , MD 04/13/21 4:53 AM

## 2021-04-13 NOTE — Transfer of Care (Signed)
Immediate Anesthesia Transfer of Care Note  Patient: Hannah Walker  Procedure(s) Performed: DILATATION AND EVACUATION  Patient Location: PACU  Anesthesia Type:Epidural  Level of Consciousness: awake  Airway & Oxygen Therapy: Patient Spontanous Breathing  Post-op Assessment: Report given to RN and Post -op Vital signs reviewed and stable  Post vital signs: Reviewed and stable  Last Vitals:  Vitals Value Taken Time  BP 113/55 04/13/21 0417  Temp    Pulse 113 04/13/21 0420  Resp 21 04/13/21 0420  SpO2 96 % 04/13/21 0420  Vitals shown include unvalidated device data.  Last Pain:  Vitals:   04/13/21 0115  TempSrc:   PainSc: 0-No pain         Complications: No notable events documented.

## 2021-04-13 NOTE — Anesthesia Postprocedure Evaluation (Signed)
Anesthesia Post Note  Patient: Marlowe Cross [place]  Procedure(s) Performed: DILATATION AND EVACUATION     Patient location during evaluation: PACU Anesthesia Type: Spinal Level of consciousness: awake and alert and oriented Pain management: pain level controlled Vital Signs Assessment: post-procedure vital signs reviewed and stable Respiratory status: spontaneous breathing, nonlabored ventilation and respiratory function stable Cardiovascular status: blood pressure returned to baseline and stable Postop Assessment: no headache, no backache, epidural receding and no apparent nausea or vomiting Anesthetic complications: no   No notable events documented.  Last Vitals:  Vitals:   04/13/21 0500 04/13/21 0530  BP: (!) 118/49 117/84  Pulse: (!) 124 (!) 109  Resp: 20 18  Temp:  36.8 C  SpO2: 96% 97%    Last Pain:  Vitals:   04/13/21 0533  TempSrc:   PainSc: 0-No pain   Pain Goal:    LLE Motor Response: (P) Purposeful movement (04/13/21 0500) LLE Sensation: (P) Tingling, Increased (04/13/21 0500) RLE Motor Response: (P) Purposeful movement (04/13/21 0500) RLE Sensation: (P) Tingling, Increased (04/13/21 0500)     Epidural/Spinal Function Cutaneous sensation: Tingles (04/13/21 0533), Patient able to flex knees: No (04/13/21 0533), Patient able to lift hips off bed: Yes (04/13/21 0533), Back pain beyond tenderness at insertion site: No (04/13/21 0533), Progressively worsening motor and/or sensory loss: No (04/13/21 0533), Bowel and/or bladder incontinence post epidural: No (04/13/21 0533)  Pervis Hocking

## 2021-04-14 LAB — CBC
HCT: 23.1 % — ABNORMAL LOW (ref 36.0–46.0)
Hemoglobin: 7.9 g/dL — ABNORMAL LOW (ref 12.0–15.0)
MCH: 30.5 pg (ref 26.0–34.0)
MCHC: 34.2 g/dL (ref 30.0–36.0)
MCV: 89.2 fL (ref 80.0–100.0)
Platelets: 158 10*3/uL (ref 150–400)
RBC: 2.59 MIL/uL — ABNORMAL LOW (ref 3.87–5.11)
RDW: 14.3 % (ref 11.5–15.5)
WBC: 15.9 10*3/uL — ABNORMAL HIGH (ref 4.0–10.5)
nRBC: 0 % (ref 0.0–0.2)

## 2021-04-14 MED ORDER — FERROUS FUMARATE 324 (106 FE) MG PO TABS
1.0000 | ORAL_TABLET | Freq: Two times a day (BID) | ORAL | Status: DC
  Administered 2021-04-14 – 2021-04-15 (×3): 106 mg via ORAL
  Filled 2021-04-14 (×3): qty 1

## 2021-04-14 NOTE — Lactation Note (Signed)
This note was copied from a baby's chart. Lactation Consultation Note  Patient Name: Hannah Walker MCNOB'S Date: 04/14/2021 Reason for consult: Follow-up assessment;Primapara;Term;1st time breastfeeding Age:23 hours   P1 mother whose infant is now 15 hours old.  This is a term baby at 39+2 weeks.  Mother's current feeding preference is breast.  Mother had a PPH.  Baby "Excell Seltzer" was asleep on mother's chest when I arrived.  Mother reported that he has breast fed well twice since his circumcision this morning.  She can hear swallows with feedings and he is content after feedings.  Encouraged lots of STS, hand expression and frequent feedings today/tonight.  Discussed the possibility of more "cluster type" feeding during the night due to circumcision today if he does not continue to feed often.  Mother verbalized understanding.  Father questioning whether or not mother will be okay to be discharged tomorrow due to her drop in hemoglobin.  Reassured family that the doctors will not discharge mother or baby if they have any concern about their well being at discharge.  Father verbalized understanding.  Encouraged to call her RN/LC for latch assistance if needed.   Maternal Data Has patient been taught Hand Expression?: Yes Does the patient have breastfeeding experience prior to this delivery?: No  Feeding Mother's Current Feeding Choice: Breast Milk  LATCH Score                    Lactation Tools Discussed/Used    Interventions Interventions: Breast feeding basics reviewed;Education  Discharge Pump: Personal (2 pumps for home use) WIC Program: No  Consult Status Consult Status: Follow-up Date: 04/15/21 Follow-up type: In-patient    Dora Sims 04/14/2021, 2:38 PM
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
## 2021-04-14 NOTE — Addendum Note (Signed)
Addendum  created 04/14/21 1858 by Catalina Gravel, MD   Clinical Note Signed

## 2021-04-14 NOTE — Consult Note (Signed)
Post-Op Rounding:  CC: LLE weakness  MDA to bedside to assess patient after complaint of LLE weakness.  Patient is POD2 from SVD and subsequent D&C for retained placenta.  Patient states that initially, she had marked LLE weakness post-surgery, but the weakness has improved over the last day.  She is able to flex at the hip, but has demonstrated quadriceps weakness.  Able to plantar flex and dorsiflex at ankle against resistance.  Discussed with patient and patient's husband that this weakness is unlikely to be due to epidural placement which had no documented complications upon review of procedure note.  Discussed possibility of nerve compression due to extended length of time in lithotomy position due to post-delivery complications.  As the weakness is improving per patient report, advised to continue to take scheduled tylenol and ibuprofen and proceed with conservative management.  Discussed that as the weakness is improving, that any potential nerve injury will likely improve with time.  All patient and FOB questions answered during the visit.  MDA available for follow-up at the request of the obstetrician.  Hoy Morn, MD Anesthesiology

## 2021-04-14 NOTE — Progress Notes (Signed)
Alerted by nurse that patient's partner was upset d/t patients continued left leg mobility issues, per RN he stated, using expletives, that he would refuse patient discharge tomorrow d/t ongoing concerns.  PT eval and treatment consult placed and msg left on their voicemail with details about patient.  Anesthesia plans to reassess again in am. Advised nurse to reassure patient and partner that we would not consider discharge tomorrow if she is still feeling poorly.  Had discussed this morning with patient option of transfusion for symptomatic anemia, but given mild intermittent symptoms and stable vital signs that proceeding with continued iron supplementation may help her avoid transfusion.  Patient also stated she would want to avoid transfusion unless necessary. Will reassess in am and discuss further.

## 2021-04-14 NOTE — Progress Notes (Addendum)
Patient is eating, ambulating, voiding.  Pain control is good. Pt reports difficult lifting left leg, states anesthesia assessed and stated possible nerve compression during D&E or trauma of delivery. Pt reports it is getting easier to walk.  Pt reports some mild intermittent dizziness.  Vitals:   04/13/21 1830 04/13/21 2201 04/14/21 0537 04/14/21 0744  BP: 114/68 105/66 97/71 100/67  Pulse: 90 88 79 73  Resp: 18 16 18 18   Temp: 97.7 F (36.5 C) 98.3 F (36.8 C) 97.7 F (36.5 C) 97.7 F (36.5 C)  TempSrc: Axillary Oral Oral Oral  SpO2: 100% 98% 99% 99%  Weight:      Height:        Fundus firm Abd: nontender Ext: no calf tenderness  Lab Results  Component Value Date   WBC 15.9 (H) 04/14/2021   HGB 7.9 (L) 04/14/2021   HCT 23.1 (L) 04/14/2021   MCV 89.2 04/14/2021   PLT 158 04/14/2021    --/--/O POS (01/12 0900)  A/P Post partum day 1 from NSVD and POD #1 from D&E for retained placenta. Acute blood loss anemia: Hb 7.9, some dizziness, encourage increased hydration, will start iron bid and monitor. S/p methergine x 24 hrs, cytotec, TXA and JADA now removed and bleeding appropriate  Routine care.  Expect d/c 1/15.    Allyn Kenner

## 2021-04-14 NOTE — Progress Notes (Signed)
Patient and family have concerns about her numbness in left leg. Anesthesia said they will round on her again tomorrow. Dr. Claiborne Billings verbal ordered a PT consult to be done for patient before discharge.  They also have concerns about her dizziness and nausea, related to low hgb. Dr. Claiborne Billings has order for iron PO bid, will use this for now as patient has stable vital signs.  Patient and family updated of plan.

## 2021-04-15 LAB — CBC
HCT: 23.5 % — ABNORMAL LOW (ref 36.0–46.0)
Hemoglobin: 8 g/dL — ABNORMAL LOW (ref 12.0–15.0)
MCH: 30.4 pg (ref 26.0–34.0)
MCHC: 34 g/dL (ref 30.0–36.0)
MCV: 89.4 fL (ref 80.0–100.0)
Platelets: 216 10*3/uL (ref 150–400)
RBC: 2.63 MIL/uL — ABNORMAL LOW (ref 3.87–5.11)
RDW: 14.5 % (ref 11.5–15.5)
WBC: 11.1 10*3/uL — ABNORMAL HIGH (ref 4.0–10.5)
nRBC: 0 % (ref 0.0–0.2)

## 2021-04-15 NOTE — Progress Notes (Signed)
Patient states PT came to evaluated this morning and reassured her and taught her exercises to improve the left leg weakness.  She reports also feeling reassured that Hb is stable at 8.0 and reports no worsening of intermittent dizziness and tolerating po iron.  Feels comfortable avoiding transfusion.  Desires to go home.  Will have pt f/u in office to reassess leg weakness later this week.  Pt states she will continue with exercise she learned.

## 2021-04-15 NOTE — Lactation Note (Signed)
This note was copied from a baby's chart. Lactation Consultation Note  Patient Name: Boy Gerilyn Stargell [id number] Date: 04/15/2021 Reason for consult: Follow-up assessment;Term;Primapara;1st time breastfeeding Age:23 hours   Lactation Follow Up Note:  Mother has decided to formula feed only.  Lactation suppression discussed.  Grandmother has already purchased cabbage to use after mother returns home.  Father present and inquiring about discharge.  "Excell Seltzer" will be getting another bilirubin check and after that the family should be able to be discharged.     Maternal Data    Feeding Mother's Current Feeding Choice: Formula Nipple Type: Extra Slow Flow  LATCH Score                    Lactation Tools Discussed/Used    Interventions Interventions: Education  Discharge Discharge Education: Engorgement and breast care Pump: Personal  Consult Status Consult Status: Complete Date: 04/15/21 Follow-up type: Call as needed    Terita Hejl R Bessie Boyte 04/15/2021, 11:20 AM

## 2021-04-15 NOTE — Discharge Summary (Signed)
Postpartum Discharge Summary     Patient Name: Hannah Walker DOB: 2000/07/23 MRN: [id number]  Date of admission: 04/12/2021 Delivery date:04/13/2021  Delivering provider: Irene Pap E  Date of discharge: 04/15/2021  Admitting diagnosis: [redacted] weeks gestation of pregnancy [Z3A.39] Intrauterine pregnancy: [redacted]w[redacted]d    Secondary diagnosis:  Principal Problem:   356weeks gestation of pregnancy  Additional problems: retained placenta requiring D&E, left leg weakness post delivery/D&E, s/p PT eval prior to discharge     Discharge diagnosis: Term Pregnancy Delivered                                              Post partum procedures: none Augmentation: AROM and Pitocin Complications: [id number] retained placenta  Hospital course: Induction of Labor With Vaginal Delivery   21y.o. yo [id number] admitted to the hospital 04/12/2021 for induction of labor.  Indication for induction: Elective.  Patient had an uncomplicated labor course as follows: Membrane Rupture Time/Date: 2:03 PM ,04/12/2021   Delivery Method:Vaginal, Spontaneous  Episiotomy: None  Lacerations:  1st degree;Perineal  Details of delivery can be found in separate delivery note.  Patient had a routine postpartum course. Patient is discharged home 04/15/21.  Newborn Data: Birth date:04/13/2021  Birth time:12:54 AM  Gender:Female  Living status:Living  Apgars:8 ,9  Weight:3657 g   Magnesium Sulfate received: No BMZ received: No Rhophylac:N/A MMR:N/A T-DaP: see office note Flu: N/A Transfusion:No  Physical exam  Vitals:   04/14/21 0744 04/14/21 1320 04/14/21 2040 04/15/21 0443  BP: 100/67 108/68 106/69 113/73  Pulse: 73 76 82 84  Resp: _0 Temp: 97.7 F (36.5 C) 98.7 F (37.1 C) 97.9 F (36.6 C) 97.9 F (36.6 C)  TempSrc: Oral Oral Oral Oral  SpO2: 99%  100% 99%  Weight:      Height:       General: alert, cooperative, and no distress Lochia: appropriate Uterine Fundus:  firm Incision: N/A DVT Evaluation: No evidence of DVT seen on physical exam. Labs: Lab Results  Component Value Date   WBC 11.1 (H) 04/15/2021   HGB 8.0 (L) 04/15/2021   HCT 23.5 (L) 04/15/2021   MCV 89.4 04/15/2021   PLT 216 04/15/2021   CMP Latest Ref Rng & Units 02/01/2021  Glucose 70 - 99 mg/dL 89  BUN 6 - 20 mg/dL <5(L)  Creatinine 0.44 - 1.00 mg/dL 0.47  Sodium 135 - 145 mmol/L 134(L)  Potassium 3.5 - 5.1 mmol/L 3.6  Chloride 98 - 111 mmol/L 106  CO2 22 - 32 mmol/L 22  Calcium 8.9 - 10.3 mg/dL 9.9  Total Protein 6.5 - 8.1 g/dL 6.1(L)  Total Bilirubin 0.3 - 1.2 mg/dL 0.5  Alkaline Phos 38 - 126 U/L 92  AST 15 - 41 U/L 18  ALT 0 - 44 U/L 14   Edinburgh Score: Edinburgh Postnatal Depression Scale Screening Tool 04/13/2021  I have been able to laugh and see the funny side of things. 0  I have looked forward with enjoyment to things. 1  I have blamed myself unnecessarily when things went wrong. 3  I have been anxious or worried for no good reason. 3  I have felt scared or panicky for no good reason. 2  Things have been getting on top of me. 2  I have been so unhappy that I have  had difficulty sleeping. 1  I have felt sad or miserable. 1  I have been so unhappy that I have been crying. 1  The thought of harming myself has occurred to me. 0  Edinburgh Postnatal Depression Scale Total 14      After visit meds:  Allergies as of 04/15/2021       Reactions   Ampicillin Anaphylaxis   Has patient had a PCN reaction causing immediate rash, facial/tongue/throat swelling, SOB or lightheadedness with hypotension: Yes Has patient had a PCN reaction causing severe rash involving mucus membranes or skin necrosis: No Has patient had a PCN reaction that required hospitalization: No Has patient had a PCN reaction occurring within the last 10 years: Yes If all of the above answers are "NO", then may proceed with Cephalosporin use.   Penicillins Anaphylaxis        Medication List      STOP taking these medications    diphenhydrAMINE 25 MG tablet Commonly known as: BENADRYL   famotidine 10 MG tablet Commonly known as: PEPCID   hydrOXYzine 25 MG tablet Commonly known as: ATARAX   ondansetron 4 MG tablet Commonly known as: ZOFRAN   ondansetron 8 MG disintegrating tablet Commonly known as: Zofran ODT   promethazine 25 MG tablet Commonly known as: PHENERGAN       TAKE these medications    acetaminophen 500 MG tablet Commonly known as: TYLENOL Take 1,000 mg by mouth every 6 (six) hours as needed.   escitalopram 20 MG tablet Commonly known as: LEXAPRO Take 20 mg by mouth daily.   PrePLUS 27-1 MG Tabs Take by mouth.   zolpidem 5 MG tablet Commonly known as: AMBIEN Take 1 tablet (5 mg total) by mouth at bedtime as needed for up to 5 days for sleep.         Discharge home in stable condition Infant Feeding:  see lactation note Infant Disposition:home with mother Discharge instruction: per After Visit Summary and Postpartum booklet. Activity: Advance as tolerated. Pelvic rest for 6 weeks.  Diet: routine diet Anticipated Birth Control: Unsure Postpartum Appointment: next week Additional Postpartum F/U: Postpartum Depression checkup Future Appointments:No future appointments. Follow up Visit:  Follow-up Information     [facility], [facility]. Schedule an appointment as soon as possible for a visit.   Why: see Dr. Rogue Bussing this week Contact information: [address] [place] [address] [phone]                     04/15/2021 Allyn Kenner, DO

## 2021-04-15 NOTE — Evaluation (Signed)
Physical Therapy Evaluation and Discharge Patient Details Name: [name] MRN: [id number] DOB: 03-18-2001 Today's Date: 04/15/2021  History of Present Illness  [name] is a 21 y.o. female [id number] 4247w1d presented for term induction of labor; delivery complicated by Abnormal postpartum bleeding, retained placenta, lower uterine segment atony, s/p OR for D&C of retained placenta under ultrasound guidance, placement of Jada device; L LE hip flexor weakness noted post procedure, and PT consulted  Clinical Impression   Patient evaluated by Physical Therapy with no further acute PT needs identified. Education provided to pt for exercises aimed at LLE hip and knee strength, with a focus on monitoring for L hip flexor strength return; education to self-monitor for activity tolerance, pt demonstrated this well; Orthostatics taken as well, which did show Incr HR with activity, and BPs overall stable; Discussed with RN; Pt's gait pattern evened out with more distance and practice, and overall she walked well without an assistive device; All education has been completed and the patient has no further questions.  I anticipate continuing improvements in LLE strength and sensation return; See below for any follow-up Physical Therapy or equipment needs. PT is signing off. Thank you for this referral.        Recommendations for follow up therapy are one component of a multi-disciplinary discharge planning process, led by the attending physician.  Recommendations may be updated based on patient status, additional functional criteria and insurance authorization.  Follow Up Recommendations Other (comment) (Potential benefit of Outpt PT can be addressed at MD follow up appointments -- though I do not anticipate a need as pt improves)    Assistance Recommended at Discharge PRN  Patient can return home with the following  Assistance with cooking/housework    Equipment Recommendations    Recommendations  for Other Services       Functional Status Assessment Patient has had a recent decline in their functional status and demonstrates the ability to make significant improvements in function in a reasonable and predictable amount of time.     Precautions / Restrictions Precautions Precautions: None Precaution Comments: Encouraged pt to self-monitor for activity tolerance Restrictions Weight Bearing Restrictions: No      Mobility  Bed Mobility Overal bed mobility: Needs Assistance Bed Mobility: Supine to Sit;Sit to Supine     Supine to sit: Supervision Sit to supine: Supervision   General bed mobility comments: Able to sit up to long sit and abduct L hip to move foot to EOB; Difficulty lifting LLE to move LLE back onto bed; this PT demonstrated ways to help LLE onto bed (using RLE, or handle of crutches) to help LLE up; pt demonstrated RLE assisting LLE onto bed well    Transfers Overall transfer level: Independent Equipment used: None               General transfer comment: Good self-monitor for dizziness    Ambulation/Gait Ambulation/Gait assistance: Supervision;Modified independent (Device/Increase time) Gait Distance (Feet): 175 Feet Assistive device: Rolling walker (2 wheels);None Gait Pattern/deviations: Decreased step length - left (initially)       General Gait Details: Initially with decr step length LLE; improved with cues for a bit more dorsiflexion of the ankle to help with clearance; with practice, gait pattern smoothed out; started with RW, and progressed to no device; reported lightheadedness within first half of walk, but noting incr regulation, and lightheadedness improved within second half of walk; overall good self-monitor for activity tolerance  Stairs  General stair comments: Discussed taking steps one step at a time, and having assist initially  Wheelchair Mobility    Modified Rankin (Stroke Patients Only)       Balance Overall  balance assessment: Mild deficits observed, not formally tested                                           Pertinent Vitals/Pain Pain Assessment: No/denies pain (Occasional grimace with movement; reports grimace because LLE "feels heavy")    Home Living Family/patient expects to be discharged to:: Private residence Living Arrangements: Spouse/significant other;Parent Available Help at Discharge: Family;Available 24 hours/day Type of Home: House Home Access: Stairs to enter Entrance Stairs-Rails: None (Plans to have assist from boyfriend, mother for steps) Entrance Stairs-Number of Steps: 2   Home Layout: One level Home Equipment: None Additional Comments: Works as a Engineer, site; 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    Prior Function Prior Level of Function : Independent/Modified Independent                     Hand Dominance        Extremity/Trunk Assessment   Upper Extremity Assessment Upper Extremity Assessment: Overall WFL for tasks assessed    Lower Extremity Assessment Lower Extremity Assessment: LLE deficits/detail LLE Deficits / Details: knee, ankle ROM WFL; hip flexor 2/5; difficulty flexing hip against gravity; able to flex hip in semi-gravity eliminated (standing) position for LLE advancement and steppin gduring walking LLE Sensation: decreased light touch (Reports sensation returning/ improved compared to yesterday)    Cervical / Trunk Assessment Cervical / Trunk Assessment: Other exceptions Cervical / Trunk Exceptions: Recent delivery  Communication   Communication: No difficulties  Cognition Arousal/Alertness: Awake/alert Behavior During Therapy: WFL for tasks assessed/performed Overall Cognitive Status: Within Functional Limits for tasks assessed                                          General Comments General comments (skin integrity, edema, etc.):   04/15/21 0900  Vital Signs  Patient  Position (if appropriate) Orthostatic Vitals  Orthostatic Sitting  BP- Sitting 121/80 (MAP 92)  Pulse- Sitting 90  Orthostatic Standing at 0 minutes  BP- Standing at 0 minutes 116/75 (MAP 86)  Pulse- Standing at 0 minutes 115  Orthostatic Standing at 3 minutes  BP- Standing at 3 minutes 112/74 (MAP 85)  Pulse- Standing at 3 minutes 119     04/15/21 0910 04/15/21 0915  Orthostatic Standing at 10 and 15 minutes  BP- Standing  117/77 (MAP 86) 135/80 (MAP 97)  Pulse- Standing  122 124       Exercises Total Joint Exercises Ankle Circles/Pumps: AROM;Left;5 reps Quad Sets: AROM;Left;5 reps Straight Leg Raises: AAROM;Left (one rep with assistance against gravity)   Assessment/Plan    PT Assessment Patient does not need any further PT services   PT Problem List         PT Treatment Interventions      PT Goals (Current goals can be found in the Care Plan section)  Acute Rehab PT Goals Patient Stated Goal: For LLE not to feel so heavy; to be able to walk well enough to get home and care for family PT Goal Formulation: All assessment and education complete,  DC therapy    Frequency       Co-evaluation               AM-PAC PT "6 Clicks" Mobility  Outcome Measure Help needed turning from your back to your side while in a flat bed without using bedrails?: None Help needed moving from lying on your back to sitting on the side of a flat bed without using bedrails?: None Help needed moving to and from a bed to a chair (including a wheelchair)?: None Help needed standing up from a chair using your arms (e.g., wheelchair or bedside chair)?: None Help needed to walk in hospital room?: None Help needed climbing 3-5 steps with a railing? : A Little 6 Click Score: 23    End of Session   Activity Tolerance: Patient tolerated treatment well Patient left: in bed;with call bell/phone within reach;with family/visitor present Nurse Communication: Mobility status;Other (comment)  (HR incr with activity, BPs stable with upright activity) PT Visit Diagnosis: Other abnormalities of gait and mobility (R26.89)    Time: 0350-0938 PT Time Calculation (min) (ACUTE ONLY): 38 min   Charges:   PT Evaluation $PT Eval Low Complexity: 1 Low PT Treatments $Gait Training: 8-22 mins $Therapeutic Activity: 8-22 mins        Van Clines, PT  Acute Rehabilitation Services Pager [phone] Office [phone]   Levi Aland 04/15/2021, 9:55 AM

## 2021-04-16 LAB — SURGICAL PATHOLOGY

## 2021-04-28 ENCOUNTER — Telehealth (HOSPITAL_COMMUNITY): Payer: Self-pay

## 2021-04-28 NOTE — Telephone Encounter (Signed)
No answer. Left message to return nurse call.  Marcelino Duster [facility] 04/28/2021,1019

## 2021-05-16 ENCOUNTER — Other Ambulatory Visit: Payer: Self-pay

## 2021-05-16 ENCOUNTER — Emergency Department (HOSPITAL_COMMUNITY)
Admission: EM | Admit: 2021-05-16 | Discharge: 2021-05-17 | Disposition: A | Discharge status: Home / Self Care | Payer: Medicaid Other | Attending: Emergency Medicine | Admitting: Emergency Medicine

## 2021-05-16 DIAGNOSIS — J029 Acute pharyngitis, unspecified: Secondary | ICD-10-CM

## 2021-05-16 DIAGNOSIS — U071 COVID-19: Secondary | ICD-10-CM | POA: Insufficient documentation

## 2021-05-16 LAB — GROUP A STREP BY PCR: Group A Strep by PCR: NOT DETECTED

## 2021-05-16 LAB — RESP PANEL BY RT-PCR (FLU A&B, COVID) ARPGX2
Influenza A by PCR: NEGATIVE
Influenza B by PCR: NEGATIVE
SARS Coronavirus 2 by RT PCR: POSITIVE — AB

## 2021-05-16 MED ORDER — IBUPROFEN 800 MG PO TABS
ORAL_TABLET | ORAL | Status: AC
  Administered 2021-05-16: 800 mg via ORAL
  Filled 2021-05-16: qty 1

## 2021-05-16 MED ORDER — AZITHROMYCIN 250 MG PO TABS: 250.0000 mg | ORAL_TABLET | Freq: Every day | ORAL | 0 refills | Status: DC

## 2021-05-16 MED ORDER — IBUPROFEN 800 MG PO TABS
800.0000 mg | ORAL_TABLET | Freq: Once | ORAL | Status: AC
Start: 2021-05-16 — End: 2021-05-16

## 2021-05-16 NOTE — ED Triage Notes (Signed)
Sore throat and fever x 3 days. Pt has white plaques on palantine tonsils.

## 2021-05-17 NOTE — ED Provider Notes (Signed)
[facility] EMERGENCY DEPARTMENT Provider Note   CSN: [id number] Arrival date & time: 05/16/21  1959     History  Chief Complaint  Patient presents with   Sore Throat   Fever    Hannah Walker is a 21 y.o. female.  The history is provided by the patient.  Sore Throat This is a new problem. The current episode started more than 2 days ago. The problem occurs daily. The problem has been gradually worsening. Pertinent negatives include no chest pain, no abdominal pain and no shortness of breath. The symptoms are aggravated by swallowing. Nothing relieves the symptoms.  Fever Associated symptoms: sore throat and vomiting   Associated symptoms: no chest pain, no cough and no diarrhea       Home Medications Prior to Admission medications   Medication Sig Start Date End Date Taking? Authorizing Provider  azithromycin (ZITHROMAX) 250 MG tablet Take 1 tablet (250 mg total) by mouth daily. Take first 2 tablets together, then 1 every day until finished. 05/16/21  Yes Zadie Rhine, MD  acetaminophen (TYLENOL) 500 MG tablet Take 1,000 mg by mouth every 6 (six) hours as needed.    [provider]  escitalopram (LEXAPRO) 20 MG tablet Take 20 mg by mouth daily. 05/08/20   [provider]  Prenatal Vit-Fe Fumarate-FA (PREPLUS) 27-1 MG TABS Take by mouth.    [provider]  zolpidem (AMBIEN) 5 MG tablet Take 1 tablet (5 mg total) by mouth at bedtime as needed for up to 5 days for sleep. 04/01/21 04/06/21  Calvert Cantor, CNM  ESTARYLLA 0.25-35 MG-MCG tablet TK 1 T PO D 04/08/18 02/18/19  [provider]  etonogestrel (NEXPLANON) 68 MG IMPL implant Nexplanon 68 mg subdermal implant  Inject 1 implant by subcutaneous route.  11/16/19  [provider]  topiramate (TOPAMAX) 25 MG tablet TAKE 4 TABLETS BY MOUTH AT NIGHT TIME 11/17/18 02/18/19  Deetta Perla, MD      Allergies    Ampicillin and Penicillins    Review of Systems   Review of  Systems  Constitutional:  Positive for fever.  HENT:  Positive for sore throat and trouble swallowing.   Respiratory:  Negative for cough and shortness of breath.   Cardiovascular:  Negative for chest pain.  Gastrointestinal:  Positive for vomiting. Negative for abdominal pain and diarrhea.   Physical Exam Updated Vital Signs BP 92/64    Pulse 93    Temp 98.2 F (36.8 C) (Oral)    Resp 16    Ht 1.651 m (5\' 5" )    Wt 68 kg    SpO2 98%    BMI 24.96 kg/m  Physical Exam CONSTITUTIONAL: Well developed/well nourished HEAD: Normocephalic/atraumatic EYES: EOMI ENMT: Mucous membranes moist, uvula midline but tonsils are enlarged with erythema and exudates. NECK: supple no meningeal signs CV: S1/S2 noted, no murmurs/rubs/gallops noted LUNGS: Lungs are clear to auscultation bilaterally, no apparent distress ABDOMEN: soft, nontender NEURO: Pt is awake/alert/appropriate, moves all extremitiesx4.  No facial droop.   EXTREMITIES:  full ROM SKIN: warm, color normal PSYCH: no abnormalities of mood noted, alert and oriented to situation  ED Results / Procedures / Treatments   Labs (all labs ordered are listed, but only abnormal results are displayed) Labs Reviewed  RESP PANEL BY RT-PCR (FLU A&B, COVID) ARPGX2 - Abnormal; Notable for the following components:      Result Value   SARS Coronavirus 2 by RT PCR POSITIVE (*)    All other components  within normal limits  GROUP A STREP BY PCR    EKG None  Radiology No results found.  Procedures Procedures    Medications Ordered in ED Medications  ibuprofen (ADVIL) tablet 800 mg (800 mg Oral Given 05/16/21 2145)    ED Course/ Medical Decision Making/ A&P                           Medical Decision Making Risk Prescription drug management.   Patient presents with acute illness with systemic symptoms.  Viral testing reveals positive for COVID-19.  However exam is more consistent with strep, patient reports long history of strep  pharyngitis. We will place patient on a Z-Pak due to her penicillin allergy to cover for strep. She is otherwise well-appearing.  Her vitals have improved and she is in no acute distress.  She is not septic appearing. Plan for discharge home        Final Clinical Impression(s) / ED Diagnoses Final diagnoses:  Pharyngitis, unspecified etiology  COVID-19    Rx / DC Orders ED Discharge Orders          Ordered    azithromycin (ZITHROMAX) 250 MG tablet  Daily        05/16/21 2331              Zadie Rhine, MD 05/17/21 [phone]

## 2021-05-20 ENCOUNTER — Telehealth: Payer: Medicaid Other | Admitting: Family

## 2021-05-20 DIAGNOSIS — B3731 Acute candidiasis of vulva and vagina: Secondary | ICD-10-CM

## 2021-05-20 MED ORDER — FLUCONAZOLE 150 MG PO TABS: 150.0000 mg | ORAL_TABLET | ORAL | 0 refills | Status: DC | PRN

## 2021-05-20 NOTE — Progress Notes (Signed)
Virtual Visit Consent   Hannah Walker, you are scheduled for a virtual visit with a [facility] provider today.     Just as with appointments in the office, your consent must be obtained to participate.  Your consent will be active for this visit and any virtual visit you may have with one of our providers in the next 365 days.     If you have a MyChart account, a copy of this consent can be sent to you electronically.  All virtual visits are billed to your insurance company just like a traditional visit in the office.    As this is a virtual visit, video technology does not allow for your provider to perform a traditional examination.  This may limit your provider's ability to fully assess your condition.  If your provider identifies any concerns that need to be evaluated in person or the need to arrange testing (such as labs, EKG, etc.), we will make arrangements to do so.     Although advances in technology are sophisticated, we cannot ensure that it will always work on either your end or our end.  If the connection with a video visit is poor, the visit may have to be switched to a telephone visit.  With either a video or telephone visit, we are not always able to ensure that we have a secure connection.     I need to obtain your verbal consent now.   Are you willing to proceed with your visit today?    Hannah Walker has provided verbal consent on 05/20/2021 for a virtual visit (video or telephone).   Jannifer Rodney, FNP   Date: 05/20/2021 6:04 PM   Virtual Visit via Video Note   I, Jannifer Rodney, connected with  Hannah Walker  ([id number], [birth year]) on 05/20/21 at  6:00 PM EST by a video-enabled telemedicine application and verified that I am speaking with the correct person using two identifiers.  Location: Patient: Virtual Visit Location Patient: Home Provider: Virtual Visit Location Provider: Home Office   I discussed the limitations of evaluation and management by  telemedicine and the availability of in person appointments. The patient expressed understanding and agreed to proceed.    History of Present Illness: Hannah Walker is a 21 y.o. who identifies as a female who was assigned female at birth, and is being seen today for yeast infection. She just completed a zpak and is having vaginal itching.   HPI: Vaginal Itching The patient's primary symptoms include genital itching, a genital odor and vaginal discharge. The patient's pertinent negatives include no vaginal bleeding. The current episode started in the past 7 days. The problem occurs intermittently. The problem has been gradually worsening. The vaginal discharge was milky.   Problems:  Patient Active Problem List   Diagnosis Date Noted   [redacted] weeks gestation of pregnancy 04/12/2021   Bilateral posterior neck pain 06/12/2020   Tired 06/03/2019   History of ovarian cyst 06/03/2019   Nexplanon in place 06/03/2019   LLQ pain 06/03/2019   Irregular bleeding 06/03/2019   Pelvic pain 06/03/2019   Episodic tension-type headache, not intractable 01/07/2018   Migraine without aura and without status migrainosus, not intractable 07/23/2017   New daily persistent headache 02/27/2017   Concussion without loss of consciousness 02/27/2017    Allergies:  Allergies  Allergen Reactions   Ampicillin Anaphylaxis    Has patient had a PCN reaction causing immediate rash, facial/tongue/throat swelling, SOB or lightheadedness with hypotension: Yes  Has patient had a PCN reaction causing severe rash involving mucus membranes or skin necrosis: No Has patient had a PCN reaction that required hospitalization: No Has patient had a PCN reaction occurring within the last 10 years: Yes If all of the above answers are "NO", then may proceed with Cephalosporin use.    Penicillins Anaphylaxis   Medications:  Current Outpatient Medications:    fluconazole (DIFLUCAN) 150 MG tablet, Take 1 tablet (150 mg total) by mouth  every three (3) days as needed., Disp: 3 tablet, Rfl: 0   acetaminophen (TYLENOL) 500 MG tablet, Take 1,000 mg by mouth every 6 (six) hours as needed., Disp: , Rfl:    Prenatal Vit-Fe Fumarate-FA (PREPLUS) 27-1 MG TABS, Take by mouth., Disp: , Rfl:   Observations/Objective: Patient is well-developed, well-nourished in no acute distress.  Resting comfortably  at home.  Head is normocephalic, atraumatic.  No labored breathing.  Speech is clear and coherent with logical content.  Patient is alert and oriented at baseline.    Assessment and Plan: 1. Vagina, candidiasis - fluconazole (DIFLUCAN) 150 MG tablet; Take 1 tablet (150 mg total) by mouth every three (3) days as needed.  Dispense: 3 tablet; Refill: 0  Keep clean and dry  Avoid scratching Probiotic   Follow Up Instructions: I discussed the assessment and treatment plan with the patient. The patient was provided an opportunity to ask questions and all were answered. The patient agreed with the plan and demonstrated an understanding of the instructions.  A copy of instructions were sent to the patient via MyChart unless otherwise noted below.     The patient was advised to call back or seek an in-person evaluation if the symptoms worsen or if the condition fails to improve as anticipated.  Time:  I spent 7 minutes with the patient via telehealth technology discussing the above problems/concerns.    Jannifer Rodney, FNP

## 2021-05-29 ENCOUNTER — Ambulatory Visit
Admission: EM | Admit: 2021-05-29 | Discharge: 2021-05-29 | Disposition: A | Discharge status: Home / Self Care | Payer: Medicaid Other | Attending: Student | Admitting: Student

## 2021-05-29 ENCOUNTER — Other Ambulatory Visit: Payer: Self-pay

## 2021-05-29 DIAGNOSIS — Z880 Allergy status to penicillin: Secondary | ICD-10-CM | POA: Diagnosis present

## 2021-05-29 DIAGNOSIS — Z973 Presence of spectacles and contact lenses: Secondary | ICD-10-CM | POA: Diagnosis not present

## 2021-05-29 DIAGNOSIS — H109 Unspecified conjunctivitis: Secondary | ICD-10-CM | POA: Diagnosis not present

## 2021-05-29 DIAGNOSIS — J020 Streptococcal pharyngitis: Secondary | ICD-10-CM | POA: Diagnosis not present

## 2021-05-29 DIAGNOSIS — Z112 Encounter for screening for other bacterial diseases: Secondary | ICD-10-CM | POA: Diagnosis not present

## 2021-05-29 LAB — POCT RAPID STREP A (OFFICE): Rapid Strep A Screen: NEGATIVE

## 2021-05-29 MED ORDER — AMOXICILLIN 875 MG PO TABS: 875.0000 mg | ORAL_TABLET | Freq: Two times a day (BID) | ORAL | 0 refills | Status: AC

## 2021-05-29 MED ORDER — CIPROFLOXACIN HCL 0.3 % OP SOLN
1.0000 [drp] | OPHTHALMIC | 0 refills | Status: AC
Start: 2021-05-29 — End: 2021-06-05

## 2021-05-29 NOTE — Discharge Instructions (Addendum)
-  Amoxicillin twice daily x7 days. If you have any allergic symptoms, like hives, facial swelling, shortness of breath, dizziness- stop the medication and seek immediate medical attention.  -We'll call if your strep culture is positive.  -You have bacterial conjunctivitis (pinkeye). -Start the antibiotic drops, ciprofloxacin 1 drop into the left eye every 4 hours while awake for 7 days. -You can also try warm compresses or washing the face with gentle soap and water to remove crust in the morning. -Your symptoms should be a lot better in about 1 day, and you'll also no longer be contagious after 1 day on the antibiotic. -If your symptoms are getting worse instead of better, like pain, discharge, itching, vision changes-follow-up with your eye doctor as soon as possible. -Do not wear contact lenses until you have completed treatment (7 days!). Wear glasses only.

## 2021-05-29 NOTE — ED Triage Notes (Signed)
Pt reports sore throat, bilateral ear pain,  headache, chills and body aches since this morning. Pt reports she had Strep on 05/16/21.  Pt reports, redness in left eye, light sensitivity x 2 days. Pt is using left over eye ointment. Pt has not changed the eye contacts in 1 week.

## 2021-05-29 NOTE — ED Provider Notes (Signed)
[facility]    CSN: [id number] Arrival date & time: 05/29/21  1556      History   Chief Complaint Chief Complaint  Patient presents with   Sore Throat   Fever   Headache    HPI Hannah Walker is a 21 y.o. female presenting with sore throat, ear pain, headaches, subjective chills following strep.  Also with left eye irritation for 2 days.  History of recent pregnancy.  States she was seen in the emergency department on 2/15 for strep; strep test was negative and COVID was positive, but they treated her with azithromycin for strep given penicillin allergy.  States symptoms improved for 2 days before worsening again.  Denies trouble swallowing, tolerating secretions.  Denies cough, congestion.  Also with left eye redness and irritation for about 2 days.  States this is improved following using leftover erythromycin ointment, but symptoms persist.  States she was wearing contacts before the symptoms developed.  Today denies photophobia, foreign body sensation, flashes of light or floaters in field of vision, lid changes, vision changes.  Has not worn her contacts in 2 days.  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   Hyperemesis gravidarum 12/21/2020   PCO (polycystic ovaries)    Renal disorder    kidney stones   Tachycardia    Urine retention    Vaginal Pap smear, abnormal     Patient Active Problem List   Diagnosis Date Noted   [redacted] weeks gestation of pregnancy 04/12/2021   Bilateral posterior neck pain 06/12/2020   Tired 06/03/2019   History of ovarian cyst 06/03/2019   Nexplanon in place 06/03/2019   LLQ pain 06/03/2019   Irregular bleeding 06/03/2019   Pelvic pain 06/03/2019   Episodic tension-type headache, not intractable 01/07/2018   Migraine without aura and without status migrainosus, not intractable 07/23/2017   New daily persistent headache 02/27/2017   Concussion without loss of  consciousness 02/27/2017    Past Surgical History:  Procedure Laterality Date   DILATION AND EVACUATION N/A 04/13/2021   Procedure: DILATATION AND EVACUATION;  Surgeon: Rowland Lathe, MD;  Location: [facility];  Service: Gynecology;  Laterality: N/A;   TENDON REPAIR Right 05/15/2018   Procedure: EXTENSOR TENDON REPAIR RIGHT THUMB;  Surgeon: Dayna Barker, MD;  Location: [place];  Service: Plastics;  Laterality: Right;    OB History     Gravida  3   Para  1   Term  1   Preterm  0   AB  2   Living  1      SAB  2   IAB  0   Ectopic  0   Multiple  0   Live Births  1            Home Medications    Prior to Admission medications   Medication Sig Start Date End Date Taking? Authorizing Provider  amoxicillin (AMOXIL) 875 MG tablet Take 1 tablet (875 mg total) by mouth 2 (two) times daily for 7 days. 05/29/21 06/05/21 Yes Hazel Sams, PA-C  ciprofloxacin (CILOXAN) 0.3 % ophthalmic solution Place 1 drop into the left eye every 4 (four) hours while awake for 7 days. 05/29/21 06/05/21 Yes Hazel Sams, PA-C  Ferrous Gluconate (IRON 27 PO) Take by mouth.   Yes [provider]  sertraline (ZOLOFT) 100 MG tablet Take 100 mg  by mouth daily.   Yes [provider]  acetaminophen (TYLENOL) 500 MG tablet Take 1,000 mg by mouth every 6 (six) hours as needed.    [provider]  fluconazole (DIFLUCAN) 150 MG tablet Take 1 tablet (150 mg total) by mouth every three (3) days as needed. 05/20/21   Sharion Balloon, FNP  Prenatal Vit-Fe Fumarate-FA (PREPLUS) 27-1 MG TABS Take by mouth.    [provider]  Raye Sorrow 0.25-35 MG-MCG tablet TK 1 T PO D 04/08/18 02/18/19  [provider]  etonogestrel (NEXPLANON) 68 MG IMPL implant Nexplanon 68 mg subdermal implant  Inject 1 implant by subcutaneous route.  11/16/19  [provider]  topiramate (TOPAMAX) 25 MG tablet TAKE 4 TABLETS BY MOUTH AT NIGHT TIME 11/17/18 02/18/19  Jodi Geralds, MD    Family History Family History  Problem Relation Age of Onset   Healthy Mother    Thyroid disease Mother    Healthy Father    Thyroid disease Maternal Grandmother     Social History Social History   Tobacco Use   Smoking status: Former    Types: E-cigarettes    Quit date: 07/30/2020    Years since quitting: 0.8   Smokeless tobacco: Never  Vaping Use   Vaping Use: Former   Quit date: 08/13/2020   Substances: Nicotine  Substance Use Topics   Alcohol use: No   Drug use: No     Allergies   Ampicillin and Penicillins   Review of Systems Review of Systems  Constitutional:  Negative for appetite change, chills and fever.  HENT:  Positive for sore throat. Negative for congestion, ear pain, rhinorrhea, sinus pressure and sinus pain.   Eyes:  Positive for itching. Negative for redness and visual disturbance.  Respiratory:  Negative for cough, chest tightness, shortness of breath and wheezing.   Cardiovascular:  Negative for chest pain and palpitations.  Gastrointestinal:  Negative for abdominal pain, constipation, diarrhea, nausea and vomiting.  Genitourinary:  Negative for dysuria, frequency and urgency.  Musculoskeletal:  Negative for myalgias.  Neurological:  Negative for dizziness, weakness and headaches.  Psychiatric/Behavioral:  Negative for confusion.   All other systems reviewed and are negative.   Physical Exam Triage Vital Signs ED Triage Vitals  Enc Vitals Group     BP 05/29/21 1617 117/87     Pulse Rate 05/29/21 1617 (!) 109     Resp 05/29/21 1617 17     Temp 05/29/21 1617 99.3 F (37.4 C)     Temp Source 05/29/21 1617 Oral     SpO2 05/29/21 1617 98 %     Weight --      Height --      Head Circumference --      Peak Flow --      Pain Score 05/29/21 1614 3     Pain Loc --      Pain Edu? --      Excl. in [place]? --    No data found.  Updated Vital Signs BP 117/87 (BP Location: Right Arm)    Pulse (!) 109 Comment: Reports heart rate is  high since she had her baby.   Temp 99.3 F (37.4 C) (Oral)    Resp 17    SpO2 98%    Breastfeeding No   Visual Acuity Right Eye Distance:   Left Eye Distance:   Bilateral Distance:    Right Eye Near:   Left Eye Near:    Bilateral Near:  Physical Exam Vitals reviewed.  Constitutional:      General: She is not in acute distress.    Appearance: Normal appearance. She is not ill-appearing.  HENT:     Head: Normocephalic and atraumatic.     Right Ear: Hearing, tympanic membrane, ear canal and external ear normal. No swelling or tenderness. No middle ear effusion. There is no impacted cerumen. No mastoid tenderness. Tympanic membrane is not injected, scarred, perforated, erythematous, retracted or bulging.     Left Ear: Hearing, tympanic membrane, ear canal and external ear normal. No swelling or tenderness.  No middle ear effusion. There is no impacted cerumen. No mastoid tenderness. Tympanic membrane is not injected, scarred, perforated, erythematous, retracted or bulging.     Mouth/Throat:     Pharynx: Oropharynx is clear. No oropharyngeal exudate or posterior oropharyngeal erythema.     Tonsils: 2+ on the right. 2+ on the left.     Comments: Tonsils 2+ bilaterally with mild erythema. On exam, uvula is midline, she is tolerating her secretions without difficulty, there is no trismus, no drooling, she has normal phonation  Eyes:     General: Lids are normal. Lids are everted, no foreign bodies appreciated. Vision grossly intact. Gaze aligned appropriately. No visual field deficit.       Right eye: No foreign body, discharge or hordeolum.        Left eye: No foreign body, discharge or hordeolum.     Extraocular Movements: Extraocular movements intact.     Right eye: No nystagmus.     Left eye: No nystagmus.     Conjunctiva/sclera:     Right eye: Right conjunctiva is not injected. No chemosis, exudate or hemorrhage.    Left eye: Left conjunctiva is injected. No chemosis, exudate or  hemorrhage.    Pupils: Pupils are equal, round, and reactive to light. Pupils are equal.     Right eye: No corneal abrasion or fluorescein uptake. Seidel exam negative.     Left eye: No corneal abrasion or fluorescein uptake. Seidel exam negative.    Visual Fields: Right eye visual fields normal and left eye visual fields normal.     Comments: L conjunctiva mildly injected. Foreign body not present. PERRLA. EOMI without pain. No proptosis. Anterior chamber, conjunctivae, sclera all without hemorrhage or visible injury. Visual acuity grossly intact. No fluorescein uptake.   Cardiovascular:     Rate and Rhythm: Regular rhythm. Tachycardia present.     Heart sounds: Normal heart sounds.  Pulmonary:     Effort: Pulmonary effort is normal.     Breath sounds: Normal breath sounds and air entry.  Lymphadenopathy:     Cervical: No cervical adenopathy.     Right cervical: No superficial cervical adenopathy.    Left cervical: No superficial cervical adenopathy.  Neurological:     General: No focal deficit present.     Mental Status: She is alert and oriented to person, place, and time.  Psychiatric:        Attention and Perception: Attention and perception normal.        Mood and Affect: Mood and affect normal.        Behavior: Behavior normal.        Thought Content: Thought content normal.        Judgment: Judgment normal.     UC Treatments / Results  Labs (all labs ordered are listed, but only abnormal results are displayed) Labs Reviewed  CULTURE, GROUP A STREP [facility])  POCT RAPID STREP  A (OFFICE)    EKG   Radiology No results found.  Procedures Procedures (including critical care time)  Medications Ordered in UC Medications - No data to display  Initial Impression / Assessment and Plan / UC Course  I have reviewed the triage vital signs and the nursing notes.  Pertinent labs & imaging results that were available during my care of the patient were reviewed by me and  considered in my medical decision making (see chart for details).     This patient is a very pleasant 22 y.o. year old female presenting with continued sore throat and fevers following strep diagnosis 2/15. Also with L eye irritation. Today she is afebrile, but tachycardic.  She was seen in the emergency department on 2/15, at that time strep was negative and COVID was positive.  She was nonetheless treated for strep with azithromycin given penicillin allergy.  States that symptoms only improved minimally.  Rapid strep is negative today, but given history, will manage with amoxicillin.  Patient has a documented history of anaphylaxis to penicillin and ampicillin, but has tolerated amoxicillin multiple times.  Per chart review, she tolerated this 10/30/2020.  Amoxicillin sent.  Strep culture sent.  For left eye irritation, DDx is bacterial conjunctivitis versus healing corneal abrasion. She does wear contact lenses. Visual acuity grossly intact. Did not check a Snellen visual acuity as she is not wearing glasses or contacts at time of visit.[name]  No fluorescein uptake on exam. Will manage with ciprofloxacin drops and avoid contacts x7 days.   ED return precautions discussed. Patient verbalizes understanding and agreement.   Coding Level 4 for acute illness with systemic symptoms, and prescription drug management   Final Clinical Impressions(s) / UC Diagnoses   Final diagnoses:  Strep pharyngitis  Wears contact lenses  Screening for streptococcal infection  Bacterial conjunctivitis of left eye  Penicillin allergy     Discharge Instructions      -Amoxicillin twice daily x7 days. If you have any allergic symptoms, like hives, facial swelling, shortness of breath, dizziness- stop the medication and seek immediate medical attention.  -We'll call if your strep culture is positive.  -You have bacterial conjunctivitis (pinkeye). -Start the antibiotic drops, ciprofloxacin 1 drop into the left eye  every 4 hours while awake for 7 days. -You can also try warm compresses or washing the face with gentle soap and water to remove crust in the morning. -Your symptoms should be a lot better in about 1 day, and you'll also no longer be contagious after 1 day on the antibiotic. -If your symptoms are getting worse instead of better, like pain, discharge, itching, vision changes-follow-up with your eye doctor as soon as possible. -Do not wear contact lenses until you have completed treatment (7 days!). Wear glasses only.    ED Prescriptions     Medication Sig Dispense Auth. Provider   amoxicillin (AMOXIL) 875 MG tablet Take 1 tablet (875 mg total) by mouth 2 (two) times daily for 7 days. 14 tablet Hazel Sams, PA-C   ciprofloxacin (CILOXAN) 0.3 % ophthalmic solution Place 1 drop into the left eye every 4 (four) hours while awake for 7 days. 1.8 mL Hazel Sams, PA-C      PDMP not reviewed this encounter.   Hazel Sams, PA-C 05/29/21 [phone]

## 2021-06-01 LAB — CULTURE, GROUP A STREP (THRC)

## 2021-06-25 ENCOUNTER — Telehealth: Payer: Medicaid Other | Admitting: Physician Assistant

## 2021-06-25 DIAGNOSIS — J069 Acute upper respiratory infection, unspecified: Secondary | ICD-10-CM | POA: Diagnosis not present

## 2021-06-25 MED ORDER — BENZONATATE 100 MG PO CAPS: 100.0000 mg | ORAL_CAPSULE | Freq: Three times a day (TID) | ORAL | 0 refills | Status: AC

## 2021-06-25 MED ORDER — FLUTICASONE PROPIONATE 50 MCG/ACT NA SUSP: 2.0000 | Freq: Every day | NASAL | 0 refills | Status: DC

## 2021-06-25 NOTE — Progress Notes (Signed)

## 2021-07-24 ENCOUNTER — Ambulatory Visit: Payer: Medicaid Other | Attending: Obstetrics and Gynecology

## 2021-07-24 DIAGNOSIS — R279 Unspecified lack of coordination: Secondary | ICD-10-CM

## 2021-07-24 DIAGNOSIS — R102 Pelvic and perineal pain: Secondary | ICD-10-CM | POA: Diagnosis not present

## 2021-07-24 DIAGNOSIS — M62838 Other muscle spasm: Secondary | ICD-10-CM

## 2021-07-24 DIAGNOSIS — M6281 Muscle weakness (generalized): Secondary | ICD-10-CM

## 2021-07-24 NOTE — Therapy (Signed)
? ?OUTPATIENT PHYSICAL THERAPY FEMALE PELVIC EVALUATION ? ? ?Patient Name: Hannah Walker ?MRN: [id number] ?DOB:2000/04/07, 21 y.o., female ?Today's Date: 07/24/2021 ? ? PT End of Session - 07/24/21 1445   ? ? Visit Number 1   ? Date for PT Re-Evaluation 10/16/21   ? Authorization Type [employer]   ? Authorization - Visit Number 1   ? Authorization - Number of Visits 12   ? PT Start Time 1404   ? PT Stop Time 1440   ? PT Time Calculation (min) 36 min   ? Activity Tolerance Patient tolerated treatment well   ? Behavior During Therapy [facility] for tasks assessed/performed   ? ?  ?  ? ?  ? ? ?Past Medical History:  ?Diagnosis Date  ? Anxiety   ? Depression   ? Headache   ? 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 Hyperemesis gravidarum 12/21/2020  ? PCO (polycystic ovaries)   ? Renal disorder   ? kidney stones  ? Tachycardia   ? Urine retention   ? Vaginal Pap smear, abnormal   ? ?Past Surgical History:  ?Procedure Laterality Date  ? DILATION AND EVACUATION N/A 04/13/2021  ? Procedure: DILATATION AND EVACUATION;  Surgeon: Charlett Nose, MD;  Location: [facility];  Service: Gynecology;  Laterality: N/A;  ? TENDON REPAIR Right 05/15/2018  ? Procedure: EXTENSOR TENDON REPAIR RIGHT THUMB;  Surgeon: Knute Neu, MD;  Location: [facility];  Service: Plastics;  Laterality: Right;  ? ?Patient Active Problem List  ? Diagnosis Date Noted  ? [redacted] weeks gestation of pregnancy 04/12/2021  ? Bilateral posterior neck pain 06/12/2020  ? Tired 06/03/2019  ? History of ovarian cyst 06/03/2019  ? Nexplanon in place 06/03/2019  ? LLQ pain 06/03/2019  ? Irregular bleeding 06/03/2019  ? Pelvic pain 06/03/2019  ? Episodic tension-type headache, not intractable 01/07/2018  ? Migraine without aura and without status migrainosus, not intractable 07/23/2017  ? New daily persistent headache 02/27/2017  ? Concussion without loss of consciousness 02/27/2017  ? ? ?PCP: Patient, No Pcp Per (Inactive) ? ?REFERRING PROVIDER: Philip Aspen, DO ? ?REFERRING DIAG:  R10.2 (ICD-10-CM) - Pelvic and perineal pain ? ?THERAPY DIAG:  ?Muscle weakness (generalized) ? ?Other muscle spasm ? ?Unspecified lack of coordination ? ?ONSET DATE: 04/13/21 ? ?SUBJECTIVE:                                                                                                                                                                                          ? ?SUBJECTIVE STATEMENT: ?Pt delivered son 04/13/21 and had hemorrhage where she was in stirrups for hours on end. After waking  up she was in a lot of pain. She has pain with bowel movement and urination. She feels like she is urinating glass when she goes. Bowel movements still hurt in her vaginal area. She is unable to wear tampons due to pain. Unable to have intercourse.  ?Fluid intake: Yes: She drinks nothing but water and feels like she gets a lot.    ? ?Patient confirms identification and approves PT to assess pelvic floor and treatment Yes ? ? ?PAIN:  ?Are you having pain? Yes Pelvic pain with above activities, but when her legs are crossed she will have pain with sitting and be very painful when she stands.  ? ? ?PRECAUTIONS: None ? ?WEIGHT BEARING RESTRICTIONS No ? ?FALLS:  ?Has patient fallen in last 6 months? No ? ?LIVING ENVIRONMENT: ?Lives with: lives with their family ?Lives in: House/apartment ? ? 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 ?PLOF: Independent ? ?PATIENT GOALS to decrease pain ? ?PERTINENT HISTORY:  ?Traumatic vaginal delivery with prolonged period in stirrups and hemorrhage ?Sexual abuse: No ? ?BOWEL MOVEMENT ?Pain with bowel movement: Yes ?Type of bowel movement:Frequency 1-2x/day, no straining ?Fully empty rectum: Yes: - ?Leakage: No ?Pads: No ?Fiber supplement: No ? ?URINATION ?Pain with urination: Yes ?Fully empty bladder: No - post-void dribbling ?Stream:  intermittent  - has to strain hard to urinate ?Urgency: Yes: will leak if she does not go immediately ?Frequency: every to an hour ?Leakage: Urge to  void, Coughing, Sneezing, Laughing, and sit<>stand ?Pads: Yes: wears liners on regular basis more form discharge ? ?INTERCOURSE ?Pain with intercourse: Initial Penetration, During Penetration, and After Intercourse for 15-20 minutes; urinating after intercourse is very painful; they are using lubricant ?Ability to have vaginal penetration:  No ?[place]: not able to orgasm since delivery ?Marinoff Scale: 0/3 ? ?PREGNANCY ?Vaginal deliveries 1 ?Tearing Yes: 1st degree tear - stitches came out within 3 days ?C-section deliveries 0 ?Currently pregnant No ? ? ?OBJECTIVE:  ? ?COGNITION: ? Overall cognitive status: Within functional limits for tasks assessed   ?  ?SENSATION: ? Light touch: Appears intact ? Proprioception: Appears intact ? ? ?GAIT: WNL ? ?POSTURE:  ?WNL ? ?PELVIC MMT: ?No active contraction palpated; with cuing and breath coordination, weak 1/5; no endurance  ? ?      PALPATION: ?  General  2 finger width diastasis recti superior to umbilicus with mild coning ? ?              External Perineal Exam redness/irritation labia minora ?              ?              Internal Pelvic Floor significant tenderness throughout superficial/deep pelvic floor with moderate restriction; tenderness worse Lt>Rt; perineal scar tissue restriction ? ?TONE: ?Moderate ? ?PROLAPSE: ?Gr 1 anterior vaginal wall laxity ? ?TODAY'S TREATMENT 07/24/21 ?EVAL  ?Manual: ?Soft tissue mobilization: ?Scar tissue mobilization: ?Myofascial release: ?Spinal mobilization: ?Internal pelvic floor techniques: ?Vaginal exam ?No emotional/communication barriers or cognitive limitation. Patient is motivated to learn. Patient understands and agrees with treatment goals and plan. PT explains patient will be examined in standing, sitting, and lying down to see how their muscles and joints work. When they are ready, they will be asked to remove their underwear so PT can examine their perineum. The patient is also given the option of providing their own  chaperone as one is not provided in our facility. The patient also has the right and  is explained the right to defer or refuse any part of the evaluation or treatment including the internal exam. With the patient's consent, PT will use one gloved finger to gently assess the muscles of the pelvic floor, seeing how well it contracts and relaxes and if there is muscle symmetry. After, the patient will get dressed and PT and patient will discuss exam findings and plan of care. PT and patient discuss plan of care, schedule, attendance policy and HEP activities. ? ?Dry needling: ?Neuromuscular re-education: ?Core retraining:  ?Core facilitation: ?Form correction: ?Pelvic floor contraction training: ?Pelvic floor bulge and A/ROM training with breath coordination for improved circulation and relaxation ?Down training: ?Diaphragmatic breathing ?Child's pose 10 breaths ?Exercises: ?Stretches/mobility: ?Cat/cow 2 x 10 ?Bent knee fall out 5x bil ?Strengthening: ?Therapeutic activities: ?Functional strengthening activities: ?Self-care: ?Vulvovaginal massage ? ? ? ?PATIENT EDUCATION:  ?Education details: Pt education performed on vulvovaginal massage and importance of mobility/relaxation training ?Person educated: Patient ?Education method: Explanation, Demonstration, Tactile cues, Verbal cues, and Handouts ?Education comprehension: verbalized understanding ? ? ?HOME EXERCISE PROGRAM: ?[id number] ? ?ASSESSMENT: ? ?CLINICAL IMPRESSION: ?Patient is a 21 y.o. female who was seen today for physical therapy evaluation and treatment for pelvic pain associated with urination, bowel movements, tampon use, and intercourse. Exam findings notable for abnormal posture, abdominal weakness and 2 finger width diastasis with coning, decreased pelvic floor muscle strength 1/5 with no endurance, restriction and tenderness throughout superficial and deep pelvic floor (Lt>Rt), Gr 1 anterior vaginal wall laxity, moderate pelvic floor tone, and perineal  scar tissue restriction. Signs and symptoms most consistent with pelvic floor tone/restriction associated with traumatic vaginal delivery. Initial treatment consisted on gentle pelvic floor A/ROM and relaxatio

## 2021-07-24 NOTE — Patient Instructions (Signed)
Vulvar/vaginal Massage: ?This is a technique to help decrease painful sensitivity in the vaginal area. It ?can also help to restore normal moisture levels in the vaginal tissues. ?With coconut oil, aloe, jojoba oil, or a specific vaginal moisturizer, gently ?massage into vaginal tissues. ?Think of this as part of your post-shower routine and moisturizing just like ?you would the rest of the body with lotion. ?This helps to increase good blood flow to the vaginal tissues. ?In addition, it also teaches the body that touch to the vagina does not have ?to be painful or threatening, but moisturizing and gentle. ? ? ?V-MAGIC (good option you can buy off amazon) ?

## 2021-08-02 ENCOUNTER — Telehealth: Payer: Self-pay | Admitting: Physical Therapy

## 2021-08-02 ENCOUNTER — Ambulatory Visit: Payer: Medicaid Other | Attending: Obstetrics and Gynecology | Admitting: Physical Therapy

## 2021-08-02 DIAGNOSIS — M62838 Other muscle spasm: Secondary | ICD-10-CM | POA: Insufficient documentation

## 2021-08-02 DIAGNOSIS — M6281 Muscle weakness (generalized): Secondary | ICD-10-CM | POA: Insufficient documentation

## 2021-08-02 DIAGNOSIS — R279 Unspecified lack of coordination: Secondary | ICD-10-CM | POA: Insufficient documentation

## 2021-08-02 NOTE — Telephone Encounter (Signed)
Pt was called due to no show ?

## 2021-08-02 NOTE — Telephone Encounter (Signed)
Pt was called due to no show and she thought the appointment had been cancelled. ? ?Russella Dar, PT ?08/02/21 2:37 PM ? ?

## 2021-08-02 NOTE — Therapy (Deleted)
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   Hyperemesis gravidarum 12/21/2020   PCO (polycystic ovaries)    Renal disorder    kidney stones   Tachycardia    Urine retention    Vaginal Pap smear, abnormal    Past Surgical History:  Procedure Laterality Date   DILATION AND EVACUATION N/A 04/13/2021   Procedure: DILATATION AND EVACUATION;  Surgeon: Rowland Lathe, MD;  Location: [facility];  Service: Gynecology;  Laterality: N/A;   TENDON REPAIR Right 05/15/2018   Procedure: EXTENSOR TENDON REPAIR RIGHT THUMB;  Surgeon: Dayna Barker, MD;  Location: [place];  Service: Plastics;  Laterality: Right;   Patient Active Problem List   Diagnosis Date Noted   [redacted] weeks gestation of pregnancy 04/12/2021   Bilateral posterior neck pain 06/12/2020   Tired 06/03/2019   History of ovarian cyst 06/03/2019   Nexplanon in place 06/03/2019   LLQ pain 06/03/2019   Irregular bleeding 06/03/2019   Pelvic pain 06/03/2019   Episodic tension-type headache, not intractable 01/07/2018   Migraine without aura and without status migrainosus, not intractable 07/23/2017   New daily persistent headache 02/27/2017   Concussion without loss of consciousness 02/27/2017    REFERRING DIAG: R10.2 (ICD-10-CM) - Pelvic and perineal pain  THERAPY DIAG:  No diagnosis found.  PERTINENT HISTORY: Traumatic vaginal delivery with prolonged period in stirrups and hemorrhage Sexual abuse: No  PRECAUTIONS: None  SUBJECTIVE: ***  PAIN:  Are you having pain? {[id number]}   OBJECTIVE: (objective measures completed at initial evaluation unless otherwise dated)  SENSATION:            Light touch:  Appears intact            Proprioception: Appears intact     GAIT: WNL   POSTURE:  WNL   PELVIC MMT: No active contraction palpated; with cuing and breath coordination, weak 1/5; no endurance          PALPATION:   General  2 finger width diastasis recti superior to umbilicus with mild coning                 External Perineal Exam redness/irritation labia minora                             Internal Pelvic Floor significant tenderness throughout superficial/deep pelvic floor with moderate restriction; tenderness worse Lt>Rt; perineal scar tissue restriction   TONE: Moderate   PROLAPSE: Gr 1 anterior vaginal wall laxity   TODAY'S TREATMENT 07/24/21 EVAL  Manual: Soft tissue mobilization: Scar tissue mobilization: Myofascial release: Spinal mobilization: Internal pelvic floor techniques: Vaginal exam No emotional/communication barriers or cognitive limitation. Patient is motivated to learn. Patient understands and agrees with treatment goals and plan. PT explains patient will be examined in standing, sitting, and lying down to see how their muscles and joints work. When they are ready, they will be asked to remove their underwear so PT can examine their perineum. The patient is also given the option of providing their own chaperone as one is not provided in our facility. The patient also has the right and is explained  the right to defer or refuse any part of the evaluation or treatment including the internal exam. With the patient's consent, PT will use one gloved finger to gently assess the muscles of the pelvic floor, seeing how well it contracts and relaxes and if there is muscle symmetry. After, the patient will get dressed and PT and patient will discuss exam findings and plan of care. PT and patient discuss plan of care, schedule, attendance policy and HEP activities.   Dry needling: Neuromuscular re-education: Core retraining:  Core facilitation: Form correction: Pelvic  floor contraction training: Pelvic floor bulge and A/ROM training with breath coordination for improved circulation and relaxation Down training: Diaphragmatic breathing Child's pose 10 breaths Exercises: Stretches/mobility: Cat/cow 2 x 10 Bent knee fall out 5x bil Strengthening: Therapeutic activities: Functional strengthening activities: Self-care: Vulvovaginal massage       PATIENT EDUCATION:  Education details: Pt education performed on vulvovaginal massage and importance of mobility/relaxation training Person educated: Patient Education method: Explanation, Demonstration, Tactile cues, Verbal cues, and Handouts Education comprehension: verbalized understanding     HOME EXERCISE PROGRAM: [id number]   ASSESSMENT:   CLINICAL IMPRESSION: Patient is a 21 y.o. female who was seen today for physical therapy evaluation and treatment for pelvic pain associated with urination, bowel movements, tampon use, and intercourse. Exam findings notable for abnormal posture, abdominal weakness and 2 finger width diastasis with coning, decreased pelvic floor muscle strength 1/5 with no endurance, restriction and tenderness throughout superficial and deep pelvic floor (Lt>Rt), Gr 1 anterior vaginal wall laxity, moderate pelvic floor tone, and perineal scar tissue restriction. Signs and symptoms most consistent with pelvic floor tone/restriction associated with traumatic vaginal delivery. Initial treatment consisted on gentle pelvic floor A/ROM and relaxation training ,diaphragmatic breathing, and down training stretches with good tolerance. She will benefit from skilled PT intervention in order to decrease pain with bowel movements/urination/intercourse/tampon use, address impairments, and improve QOL.      OBJECTIVE IMPAIRMENTS decreased activity tolerance, decreased coordination, decreased endurance, decreased mobility, decreased ROM, decreased strength, hypomobility, increased fascial restrictions,  increased muscle spasms, postural dysfunction, and pain.    ACTIVITY LIMITATIONS  urination/bowel movements, intercourse, tampon use .    PERSONAL FACTORS 1 comorbidity: traumatic vaginal delivery  are also affecting patient's functional outcome.      REHAB POTENTIAL: Good   CLINICAL DECISION MAKING: Stable/uncomplicated   EVALUATION COMPLEXITY: Low     GOALS: Goals reviewed with patient? Yes   SHORT TERM GOALS: Target date: 08/21/2021   Pt will be independent with HEP.    Baseline: Goal status: INITIAL   2.  Pt will be able to correctly perform diaphragmatic breathing and appropriate pressure management in order to prevent worsening vaginal wall laxity and improve pelvic floor A/ROM.    Baseline:  Goal status: INITIAL   3.  Pt will be independent with the knack, urge suppression technique, and double voiding in order to improve bladder habits and decrease urinary incontinence.    Baseline:  Goal status: INITIAL     LONG TERM GOALS: Target date: 10/16/2021   Pt will be independent with advanced HEP.    Baseline:  Goal status: INITIAL   2.  Pt will demonstrate normal pelvic floor muscle tone and A/ROM, able to achieve 4/5 strength with contractions and 10 sec endurance, in order to provide appropriate lumbopelvic support in functional activities.    Baseline:  Goal status: INITIAL   3.  Pt will be able to sit, perform toileting activities, have intercourse, and use  tampons without pelvic pain.  Baseline:  Goal status: INITIAL   4.  Pt will report no episodes of urinary incontinence in order to improve confidence in community activities and personal hygiene.    Baseline:  Goal status: INITIAL       PLAN: PT FREQUENCY: 1x/week   PT DURATION: 12 weeks   PLANNED INTERVENTIONS: Therapeutic exercises, Therapeutic activity, Neuromuscular re-education, Balance training, Gait training, Patient/Family education, Joint mobilization, Dry Needling, Biofeedback, and  Manual therapy   PLAN FOR NEXT SESSION: Plan to progress mobility exercises and down training; perform abdominal soft tissue mobilization; likely hold off on pelvic floor release until patient has had time to perform more down training.     Camillo Flaming Kindle Strohmeier, PT 08/02/2021, 1:58 PM

## 2021-08-10 ENCOUNTER — Encounter (HOSPITAL_COMMUNITY): Payer: Self-pay | Admitting: Obstetrics and Gynecology

## 2021-08-10 ENCOUNTER — Inpatient Hospital Stay (HOSPITAL_COMMUNITY)
Admission: AD | Admit: 2021-08-10 | Discharge: 2021-08-10 | Disposition: A | Discharge status: Home / Self Care | Payer: Medicaid Other | Attending: Obstetrics and Gynecology | Admitting: Obstetrics and Gynecology

## 2021-08-10 ENCOUNTER — Inpatient Hospital Stay (HOSPITAL_COMMUNITY): Payer: Medicaid Other

## 2021-08-10 DIAGNOSIS — Z3A01 Less than 8 weeks gestation of pregnancy: Secondary | ICD-10-CM | POA: Diagnosis not present

## 2021-08-10 DIAGNOSIS — O4691 Antepartum hemorrhage, unspecified, first trimester: Secondary | ICD-10-CM | POA: Insufficient documentation

## 2021-08-10 DIAGNOSIS — O99891 Other specified diseases and conditions complicating pregnancy: Secondary | ICD-10-CM | POA: Diagnosis not present

## 2021-08-10 DIAGNOSIS — O3680X0 Pregnancy with inconclusive fetal viability, not applicable or unspecified: Secondary | ICD-10-CM | POA: Insufficient documentation

## 2021-08-10 DIAGNOSIS — Z6740 Type O blood, Rh positive: Secondary | ICD-10-CM | POA: Insufficient documentation

## 2021-08-10 DIAGNOSIS — R102 Pelvic and perineal pain: Secondary | ICD-10-CM | POA: Insufficient documentation

## 2021-08-10 LAB — URINALYSIS, ROUTINE W REFLEX MICROSCOPIC
Bilirubin Urine: NEGATIVE
Glucose, UA: NEGATIVE mg/dL
Ketones, ur: NEGATIVE mg/dL
Nitrite: NEGATIVE
Protein, ur: 100 mg/dL — AB
RBC / HPF: >50 RBC/hpf — ABNORMAL HIGH (ref 0–5)
Specific Gravity, Urine: 1.012 (ref 1.005–1.030)
pH: 6 (ref 5.0–8.0)

## 2021-08-10 LAB — CBC
HCT: 31.5 % — ABNORMAL LOW (ref 36.0–46.0)
Hemoglobin: 10.2 g/dL — ABNORMAL LOW (ref 12.0–15.0)
MCH: 24.9 pg — ABNORMAL LOW (ref 26.0–34.0)
MCHC: 32.4 g/dL (ref 30.0–36.0)
MCV: 77 fL — ABNORMAL LOW (ref 80.0–100.0)
Platelets: 305 10*3/uL (ref 150–400)
RBC: 4.09 MIL/uL (ref 3.87–5.11)
RDW: 18 % — ABNORMAL HIGH (ref 11.5–15.5)
WBC: 5.9 10*3/uL (ref 4.0–10.5)
nRBC: 0 % (ref 0.0–0.2)

## 2021-08-10 LAB — WET PREP, GENITAL
Clue Cells Wet Prep HPF POC: NONE SEEN
Sperm: NONE SEEN
Trich, Wet Prep: NONE SEEN
WBC, Wet Prep HPF POC: <10 (ref <10)
Yeast Wet Prep HPF POC: NONE SEEN

## 2021-08-10 LAB — HCG, QUANTITATIVE, PREGNANCY: hCG, Beta Chain, Quant, S: 29 m[IU]/mL — ABNORMAL HIGH (ref <5)

## 2021-08-10 NOTE — MAU Note (Signed)
.  Hannah Walker is a 21 y.o. at Unknown here in MAU reporting: pt had positive pregnancy test yesterday. Started bleeding and cramping this morning. Had intercourse yesterday.  ?LMP: 07/02/2021 ?Onset of complaint: this morning.  ?Pain score: 5/10 ?Vitals:  ? 08/10/21 0834  ?BP: 116/79  ?Pulse: 89  ?Resp: (!) 89  ?Temp: 98.4 ?F (36.9 ?C)  ?   ? ?Lab orders placed from triage:   upt,U/A ?

## 2021-08-10 NOTE — MAU Provider Note (Signed)
?History  ?  ? ?CSN: [id number] ? ?Arrival date and time: 08/10/21 0816 ? ? Event Date/Time  ? First Provider Initiated Contact with Patient 08/10/21 [phone]   ?  ? ?Chief Complaint  ?Patient presents with  ? Vaginal Bleeding  ? ?Hannah Walker is a 21 y.o. [phone] at [redacted]w[redacted]d who presents today with vaginal bleeding.  ? ?Vaginal Bleeding ?The patient's primary symptoms include pelvic pain and vaginal bleeding. This is a new problem. The problem occurs constantly. The problem has been unchanged. The problem affects the right side. She is pregnant. The vaginal discharge was bloody. The vaginal bleeding is typical of menses. She has been passing clots (x1). She has not been passing tissue. Nothing aggravates the symptoms. She has tried nothing for the symptoms. Her menstrual history has been regular (LMP 07/02/2021).  ? ?OB History   ? ? Gravida  ?4  ? Para  ?1  ? Term  ?1  ? Preterm  ?0  ? AB  ?2  ? Living  ?1  ?  ? ? SAB  ?2  ? IAB  ?0  ? Ectopic  ?0  ? Multiple  ?0  ? Live Births  ?1  ?   ?  ?  ? ? ?Past Medical History:  ?Diagnosis Date  ? Anxiety   ? Depression   ? Headache   ? 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 Hyperemesis gravidarum 12/21/2020  ? PCO (polycystic ovaries)   ? Renal disorder   ? kidney stones  ? Tachycardia   ? Urine retention   ? Vaginal Pap smear, abnormal   ? ? ?Past Surgical History:  ?Procedure Laterality Date  ? DILATION AND EVACUATION N/A 04/13/2021  ? Procedure: DILATATION AND EVACUATION;  Surgeon: Charlett Nose, MD;  Location: [facility];  Service: Gynecology;  Laterality: N/A;  ? TENDON REPAIR Right 05/15/2018  ? Procedure: EXTENSOR TENDON REPAIR RIGHT THUMB;  Surgeon: Knute Neu, MD;  Location: [facility];  Service: Plastics;  Laterality: Right;  ? ? ?Family History  ?Problem Relation Age of Onset  ? Healthy Mother   ? Thyroid disease Mother   ? Healthy Father   ? Thyroid disease Maternal Grandmother   ? ? ?Social History  ? ?Tobacco Use  ? Smoking status: Former  ?  Types: E-cigarettes  ?  Quit  date: 07/30/2020  ?  Years since quitting: 1.0  ? Smokeless tobacco: Never  ?Vaping Use  ? Vaping Use: Former  ? Quit date: 08/13/2020  ? Substances: Nicotine  ?Substance Use Topics  ? Alcohol use: No  ? Drug use: No  ? ? ?Allergies:  ?Allergies  ?Allergen Reactions  ? Ampicillin Anaphylaxis  ?  Has patient had a PCN reaction causing immediate rash, facial/tongue/throat swelling, SOB or lightheadedness with hypotension: Yes ?Has patient had a PCN reaction causing severe rash involving mucus membranes or skin necrosis: No ?Has patient had a PCN reaction that required hospitalization: No ?Has patient had a PCN reaction occurring within the last 10 years: Yes ?If all of the above answers are "NO", then may proceed with Cephalosporin use. ?  ? Penicillins Anaphylaxis  ? ? ?Medications Prior to Admission  ?Medication Sig Dispense Refill Last Dose  ? ALPRAZolam (XANAX) 0.25 MG tablet Take 0.25 mg by mouth 2 (two) times daily as needed for anxiety.     ? sertraline (ZOLOFT) 100 MG tablet Take 100 mg by mouth daily.   08/10/2021  ? acetaminophen (TYLENOL) 500 MG tablet Take  1,000 mg by mouth every 6 (six) hours as needed.     ? Ferrous Gluconate (IRON 27 PO) Take by mouth.     ? fluconazole (DIFLUCAN) 150 MG tablet Take 1 tablet (150 mg total) by mouth every three (3) days as needed. 3 tablet 0   ? fluticasone (FLONASE) 50 MCG/ACT nasal spray Place 2 sprays into both nostrils daily. 16 g 0   ? Prenatal Vit-Fe Fumarate-FA (PREPLUS) 27-1 MG TABS Take by mouth.     ? ? ?Review of Systems  ?Genitourinary:  Positive for pelvic pain and vaginal bleeding.  ?All other systems reviewed and are negative. ?Physical Exam  ? ?Blood pressure 116/79, pulse 89, temperature 98.4 ?F (36.9 ?C), resp. rate (!) 89, height 5\' 5"  (1.651 m), last menstrual period 07/02/2021, not currently breastfeeding. ? ?Physical Exam ?Constitutional:   ?   Appearance: She is well-developed.  ?HENT:  ?   Head: Normocephalic.  ?Eyes:  ?   Pupils: Pupils are equal,  round, and reactive to light.  ?Cardiovascular:  ?   Rate and Rhythm: Normal rate and regular rhythm.  ?   Heart sounds: Normal heart sounds.  ?Pulmonary:  ?   Effort: Pulmonary effort is normal. No respiratory distress.  ?   Breath sounds: Normal breath sounds.  ?Abdominal:  ?   Palpations: Abdomen is soft.  ?   Tenderness: There is no abdominal tenderness.  ?Genitourinary: ?   Vagina: No bleeding. Vaginal discharge: mucusy. ?   Comments: External: no lesion ?Vagina: small amount of white discharge ?  ? ? ?Musculoskeletal:     ?   General: Normal range of motion.  ?   Cervical back: Normal range of motion and neck supple.  ?Skin: ?   General: Skin is warm and dry.  ?Neurological:  ?   Mental Status: She is alert and oriented to person, place, and time.  ?Psychiatric:     ?   Mood and Affect: Mood normal.     ?   Behavior: Behavior normal.  ? ? ?Results for orders placed or performed during the hospital encounter of 08/10/21 (from the past 24 hour(s))  ?CBC     Status: Abnormal  ? Collection Time: 08/10/21  9:14 AM  ?Result Value Ref Range  ? WBC 5.9 4.0 - 10.5 K/uL  ? RBC 4.09 3.87 - 5.11 MIL/uL  ? Hemoglobin 10.2 (L) 12.0 - 15.0 g/dL  ? HCT 31.5 (L) 36.0 - 46.0 %  ? MCV 77.0 (L) 80.0 - 100.0 fL  ? MCH 24.9 (L) 26.0 - 34.0 pg  ? MCHC 32.4 30.0 - 36.0 g/dL  ? RDW 18.0 (H) 11.5 - 15.5 %  ? Platelets 305 150 - 400 K/uL  ? nRBC 0.0 0.0 - 0.2 %  ?hCG, quantitative, pregnancy     Status: Abnormal  ? Collection Time: 08/10/21  9:14 AM  ?Result Value Ref Range  ? hCG, Beta Chain, Quant, S 29 (H) <5 mIU/mL  ?Wet prep, genital     Status: None  ? Collection Time: 08/10/21  9:38 AM  ? Specimen: Vaginal  ?Result Value Ref Range  ? Yeast Wet Prep HPF POC NONE SEEN NONE SEEN  ? Trich, Wet Prep NONE SEEN NONE SEEN  ? Clue Cells Wet Prep HPF POC NONE SEEN NONE SEEN  ? WBC, Wet Prep HPF POC <10 <10  ? Sperm NONE SEEN   ? ?10/10/21 OB LESS THAN 14 WEEKS WITH OB TRANSVAGINAL ? ?Result Date: 08/10/2021 ?CLINICAL DATA:  Vaginal bleeding in  first trimester of pregnancy, LMP 07/02/2021, pending quantitative beta hCG EXAM: OBSTETRIC <14 WK US AND TRANSVAGINAL OB US TECHNIQUE: Both transabdominal and transvaginal ultrasound examinations were performed for complete evaluation of the gestation as well as the maternal uterus, adnexal regions, and pelvic cul-de-sac. Transvaginal technique was performed to assess early pregnancy. COMPARISON:  None Available. FINDINGS: Intrauterine gestational sac: Present, single Yolk sac:  Absent Embryo:  Absent Cardiac Activity: N/A Heart Rate: N/A  bpm MSD: 2.8 mm   4 w   6 d Subchorionic hemorrhage:  None visualized. Maternal uterus/adnexae: Remainder of anteverted uterus normal appearance. No endometrial mass or additional fluid. Unremarkable ovaries. No free fluid or adnexal masses. IMPRESSION: Tiny gestational sac within the uterus, size corresponding to 4 weeks 6 days EGA. No fetal pole visualized to establish viability. Remainder of exam normal. Electronically Signed   By: Ulyses SouthwardMark  Boles M.D.   On: 08/10/2021 10:26   ? ? ?MAU Course  ?Procedures ? ?MDM ? ? ?Assessment and Plan  ? ?1. Pregnancy of unknown anatomic location   ?2. Type O blood, Rh positive   ? ?DC home in stable condition  ?Return in 48 hours for repeat HCG  ?1st Trimester precautions  ?Bleeding precautions ?Ectopic precautions ?RX: none  ?Return to MAU as needed ? ? ? Follow-up Information   ? ? [facility] Follow up in 2 day(s).   ?Specialty: Obstetrics and Gynecology ?Why: Return Sunday for repeat blood work ?Contact information: ?[address] ?[id number] mc ?[place] [id number] ?[phone] ? ?  ?  ? ?  ?  ? ?  ? ?Thressa ShellerHeather Denver Bentson DNP, CNM  ?08/10/21  10:48 AM  ? ? ? ?

## 2021-08-12 ENCOUNTER — Inpatient Hospital Stay (HOSPITAL_COMMUNITY)
Admission: AD | Admit: 2021-08-12 | Discharge: 2021-08-12 | Disposition: A | Discharge status: Home / Self Care | Payer: Medicaid Other | Attending: Obstetrics and Gynecology | Admitting: Obstetrics and Gynecology

## 2021-08-12 ENCOUNTER — Inpatient Hospital Stay (HOSPITAL_COMMUNITY): Payer: Medicaid Other | Attending: Obstetrics and Gynecology

## 2021-08-12 DIAGNOSIS — O039 Complete or unspecified spontaneous abortion without complication: Secondary | ICD-10-CM | POA: Diagnosis present

## 2021-08-12 DIAGNOSIS — Z6790 Unspecified blood type, Rh positive: Secondary | ICD-10-CM | POA: Diagnosis not present

## 2021-08-12 LAB — HCG, QUANTITATIVE, PREGNANCY: hCG, Beta Chain, Quant, S: 8 m[IU]/mL — ABNORMAL HIGH (ref <5)

## 2021-08-12 NOTE — MAU Note (Signed)
Pt reports to mau for follow up hcg.  Reports bleeding has slowed down and pain "has died down a lot" rating cramps 1/10 today.   ?

## 2021-08-12 NOTE — MAU Provider Note (Signed)
Hannah Walker  is a 21 y.o. [phone] at [redacted]w[redacted]d who presents to MAU today for follow-up quant hCG after 48 hours. The patient was seen in MAU on 08/10/21 for VB and had quant hCG of 29 and US showed possible small IUGS only. She denies pain, vaginal bleeding or fever today.  ? ?OB History  ?Gravida Para Term Preterm AB Living  ?4 1 1  0 2 1  ?SAB IAB Ectopic Multiple Live Births  ?2 0 0 0 1  ?  ?# Outcome Date GA Lbr Len/2nd Weight Sex Delivery Anes PTL Lv  ?4 Current           ?3 Term 04/13/21 [redacted]w[redacted]d 09:18 / 00:36 3657 g M Vag-Spont EPI  LIV  ?2 SAB 05/2020          ?1 SAB 12/2019          ? ? ?Past Medical History:  ?Diagnosis Date  ? Anxiety   ? Depression   ? Headache   ? 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 Hyperemesis gravidarum 12/21/2020  ? PCO (polycystic ovaries)   ? Renal disorder   ? kidney stones  ? Tachycardia   ? Urine retention   ? Vaginal Pap smear, abnormal   ? ?ROS: ?no VB ?no pain ? ?BP 115/73 (BP Location: Right Arm)   Pulse 83   Temp 98.2 ?F (36.8 ?C) (Oral)   Resp 16   LMP 07/02/2021   SpO2 99%   ?CONSTITUTIONAL: Well-developed, well-nourished female in no acute distress.  ?MUSCULOSKELETAL: Normal range of motion.  ?CARDIOVASCULAR: Regular heart rate ?RESPIRATORY: Normal effort ?NEUROLOGICAL: Alert and oriented to person, place, and time.  ?SKIN: Not diaphoretic. No erythema. No pallor. ?PSYCH: Normal mood and affect. Normal behavior. Normal judgment and thought content. ? ?Results for orders placed or performed during the hospital encounter of 08/12/21 (from the past 24 hour(s))  ?hCG, quantitative, pregnancy     Status: Abnormal  ? Collection Time: 08/12/21  9:22 AM  ?Result Value Ref Range  ? hCG, Beta Chain, Quant, S 8 (H) <5 mIU/mL  ? ? ?MDM: Labs ordered and reviewed. Falling qhcg indicates SAB. Informed pt and partner. Pt tearful, condolences given. Stable for discharge.  ? ?A: ?1. SAB (spontaneous abortion)   ?2. Blood type, Rh positive   ? ? ?P: ?Discharge home ?Patient will return for  follow-up at [facility] in 2 weeks-pt to schedule ?Patient may return to MAU as needed or if her condition were to change or worsen  ? ?[facility], CNM ?08/12/2021 11:31 AM  ? ?

## 2021-08-13 ENCOUNTER — Ambulatory Visit: Payer: Medicaid Other

## 2021-08-13 LAB — GC/CHLAMYDIA PROBE AMP (~~LOC~~) NOT AT ARMC
Chlamydia: NEGATIVE
Comment: NEGATIVE
Comment: NORMAL
Neisseria Gonorrhea: NEGATIVE

## 2021-08-13 NOTE — Therapy (Incomplete)
?OUTPATIENT PHYSICAL THERAPY TREATMENT NOTE ? ? ?Patient Name: Hannah Walker ?MRN: [id number] ?DOB:2001-03-14, 21 y.o., female ?Today's Date: 08/13/2021 ? ?PCP: *** ?REFERRING PROVIDER: *** ? ?END OF SESSION:  ? ? ?Past Medical History:  ?Diagnosis Date  ? Anxiety   ? Depression   ? Headache   ? 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 Hyperemesis gravidarum 12/21/2020  ? PCO (polycystic ovaries)   ? Renal disorder   ? kidney stones  ? Tachycardia   ? Urine retention   ? Vaginal Pap smear, abnormal   ? ?Past Surgical History:  ?Procedure Laterality Date  ? DILATION AND EVACUATION N/A 04/13/2021  ? Procedure: DILATATION AND EVACUATION;  Surgeon: Charlett Nose, MD;  Location: [facility];  Service: Gynecology;  Laterality: N/A;  ? TENDON REPAIR Right 05/15/2018  ? Procedure: EXTENSOR TENDON REPAIR RIGHT THUMB;  Surgeon: Knute Neu, MD;  Location: [facility];  Service: Plastics;  Laterality: Right;  ? ?Patient Active Problem List  ? Diagnosis Date Noted  ? [redacted] weeks gestation of pregnancy 04/12/2021  ? Bilateral posterior neck pain 06/12/2020  ? Tired 06/03/2019  ? History of ovarian cyst 06/03/2019  ? Nexplanon in place 06/03/2019  ? LLQ pain 06/03/2019  ? Irregular bleeding 06/03/2019  ? Pelvic pain 06/03/2019  ? Episodic tension-type headache, not intractable 01/07/2018  ? Migraine without aura and without status migrainosus, not intractable 07/23/2017  ? New daily persistent headache 02/27/2017  ? Concussion without loss of consciousness 02/27/2017  ? ? ?REFERRING DIAG: *** ? ?THERAPY DIAG:  ?No diagnosis found. ? ?PERTINENT HISTORY: *** ? ?PRECAUTIONS: *** ? ?SUBJECTIVE: *** ? ?PAIN:  ?Are you having pain? {[id number]} ? ? ?SUBJECTIVE STATEMENT: ?Pt delivered son 04/13/21 and had hemorrhage where she was in stirrups for hours on end. After waking up she was in a lot of pain. She has pain with bowel movement and urination. She feels like she is urinating glass when she goes. Bowel movements still hurt in her vaginal area.  She is unable to wear tampons due to pain. Unable to have intercourse.  ?Fluid intake: Yes: She drinks nothing but water and feels like she gets a lot.    ?  ?Patient confirms identification and approves PT to assess pelvic floor and treatment Yes ?  ?  ?PAIN:  ?Are you having pain? Yes Pelvic pain with above activities, but when her legs are crossed she will have pain with sitting and be very painful when she stands.  ?  ?  ?PRECAUTIONS: None ?  ?WEIGHT BEARING RESTRICTIONS No ?  ?FALLS:  ?Has patient fallen in last 6 months? No ?  ?LIVING ENVIRONMENT: ?Lives with: lives with their family ?Lives in: House/apartment ?  ?  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  ?PLOF: Independent ?  ?PATIENT GOALS to decrease pain ?  ?PERTINENT HISTORY:  ?Traumatic vaginal delivery with prolonged period in stirrups and hemorrhage ?Sexual abuse: No ?  ?BOWEL MOVEMENT ?Pain with bowel movement: Yes ?Type of bowel movement:Frequency 1-2x/day, no straining ?Fully empty rectum: Yes: - ?Leakage: No ?Pads: No ?Fiber supplement: No ?  ?URINATION ?Pain with urination: Yes ?Fully empty bladder: No - post-void dribbling ?Stream:  intermittent  - has to strain hard to urinate ?Urgency: Yes: will leak if she does not go immediately ?Frequency: every to an hour ?Leakage: Urge to void, Coughing, Sneezing, Laughing, and sit<>stand ?Pads: Yes: wears liners on regular basis more form discharge ?  ?INTERCOURSE ?Pain with intercourse: Initial Penetration,  During Penetration, and After Intercourse for 15-20 minutes; urinating after intercourse is very painful; they are using lubricant ?Ability to have vaginal penetration:  No ?Climax: not able to orgasm since delivery ?Marinoff Scale: 0/3 ?  ?PREGNANCY ?Vaginal deliveries 1 ?Tearing Yes: 1st degree tear - stitches came out within 3 days ?C-section deliveries 0 ?Currently pregnant No ?  ?  ?OBJECTIVE:  ?  ?COGNITION: ?           Overall cognitive status: Within functional limits for tasks  assessed              ?            ?SENSATION: ?           Light touch: Appears intact ?           Proprioception: Appears intact ?  ?  ?GAIT: WNL ?  ?POSTURE:  ?WNL ?  ?PELVIC MMT: ?No active contraction palpated; with cuing and breath coordination, weak 1/5; no endurance  ?  ?      PALPATION: ?  General  2 finger width diastasis recti superior to umbilicus with mild coning ?  ?              External Perineal Exam redness/irritation labia minora ?              ?              Internal Pelvic Floor significant tenderness throughout superficial/deep pelvic floor with moderate restriction; tenderness worse Lt>Rt; perineal scar tissue restriction ?  ?TONE: ?Moderate ?  ?PROLAPSE: ?Gr 1 anterior vaginal wall laxity ?  ?TODAY'S TREATMENT 08/13/21: ?Manual: ?Soft tissue mobilization: ?Scar tissue mobilization: ?Myofascial release: ?Spinal mobilization: ?Internal pelvic floor techniques: ?Dry needling: ?Neuromuscular re-education: ?Core retraining:  ?Core facilitation: ?Form correction: ?Pelvic floor contraction training: ?Down training: ?Exercises: ?Stretches/mobility: ?Strengthening: ?Therapeutic activities: ?Functional strengthening activities: ?Self-care: ? ? ? ? ?TREATMENT 07/24/21 ?EVAL  ?Manual: ?Soft tissue mobilization: ?Scar tissue mobilization: ?Myofascial release: ?Spinal mobilization: ?Internal pelvic floor techniques: ?Vaginal exam ?No emotional/communication barriers or cognitive limitation. Patient is motivated to learn. Patient understands and agrees with treatment goals and plan. PT explains patient will be examined in standing, sitting, and lying down to see how their muscles and joints work. When they are ready, they will be asked to remove their underwear so PT can examine their perineum. The patient is also given the option of providing their own chaperone as one is not provided in our facility. The patient also has the right and is explained the right to defer or refuse any part of the evaluation or  treatment including the internal exam. With the patient's consent, PT will use one gloved finger to gently assess the muscles of the pelvic floor, seeing how well it contracts and relaxes and if there is muscle symmetry. After, the patient will get dressed and PT and patient will discuss exam findings and plan of care. PT and patient discuss plan of care, schedule, attendance policy and HEP activities. ?  ?Dry needling: ?Neuromuscular re-education: ?Core retraining:  ?Core facilitation: ?Form correction: ?Pelvic floor contraction training: ?Pelvic floor bulge and A/ROM training with breath coordination for improved circulation and relaxation ?Down training: ?Diaphragmatic breathing ?Child's pose 10 breaths ?Exercises: ?Stretches/mobility: ?Cat/cow 2 x 10 ?Bent knee fall out 5x bil ?Strengthening: ?Therapeutic activities: ?Functional strengthening activities: ?Self-care: ?Vulvovaginal massage ?  ?  ?  ?PATIENT EDUCATION:  ?Education details: Pt education performed on vulvovaginal massage and importance of mobility/relaxation training ?  Person educated: Patient ?Education method: Explanation, Demonstration, Tactile cues, Verbal cues, and Handouts ?Education comprehension: verbalized understanding ?  ?  ?HOME EXERCISE PROGRAM: ?[id number] ?  ?ASSESSMENT: ?  ?CLINICAL IMPRESSION: ?She will benefit from skilled PT intervention in order to decrease pain with bowel movements/urination/intercourse/tampon use, address impairments, and improve QOL.  ?  ?  ?OBJECTIVE IMPAIRMENTS decreased activity tolerance, decreased coordination, decreased endurance, decreased mobility, decreased ROM, decreased strength, hypomobility, increased fascial restrictions, increased muscle spasms, postural dysfunction, and pain.  ?  ?ACTIVITY LIMITATIONS  urination/bowel movements, intercourse, tampon use .  ?  ?PERSONAL FACTORS 1 comorbidity: traumatic vaginal delivery  are also affecting patient's functional outcome.  ?  ?  ?REHAB POTENTIAL: Good ?   ?CLINICAL DECISION MAKING: Stable/uncomplicated ?  ?EVALUATION COMPLEXITY: Low ?  ?  ?GOALS: ?Goals reviewed with patient? Yes ?  ?SHORT TERM GOALS: Target date: 08/21/2021 ?  ?Pt will be independent with

## 2021-08-21 ENCOUNTER — Ambulatory Visit: Payer: Medicaid Other

## 2021-08-22 ENCOUNTER — Ambulatory Visit: Payer: Medicaid Other

## 2021-08-22 DIAGNOSIS — R279 Unspecified lack of coordination: Secondary | ICD-10-CM | POA: Diagnosis present

## 2021-08-22 DIAGNOSIS — M6281 Muscle weakness (generalized): Secondary | ICD-10-CM

## 2021-08-22 DIAGNOSIS — M62838 Other muscle spasm: Secondary | ICD-10-CM

## 2021-08-22 NOTE — Therapy (Signed)
OUTPATIENT PHYSICAL THERAPY TREATMENT NOTE   Patient Name: Hannah Walker MRN: [id number] DOB:04/12/2000, 21 y.o., female Today's Date: 08/22/2021  PCP: None REFERRING PROVIDER: Allyn Kenner, DO  END OF SESSION:   PT End of Session - 08/22/21 1534     Visit Number 2    Date for PT Re-Evaluation 10/16/21    Authorization Type [employer]    PT Start Time [id number]    PT Stop Time 1610    PT Time Calculation (min) 40 min    Activity Tolerance Patient tolerated treatment well    Behavior During Therapy [facility] for tasks assessed/performed             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   Hyperemesis gravidarum 12/21/2020   PCO (polycystic ovaries)    Renal disorder    kidney stones   Tachycardia    Urine retention    Vaginal Pap smear, abnormal    Past Surgical History:  Procedure Laterality Date   DILATION AND EVACUATION N/A 04/13/2021   Procedure: DILATATION AND EVACUATION;  Surgeon: Rowland Lathe, MD;  Location: [facility];  Service: Gynecology;  Laterality: N/A;   TENDON REPAIR Right 05/15/2018   Procedure: EXTENSOR TENDON REPAIR RIGHT THUMB;  Surgeon: Dayna Barker, MD;  Location: [facility];  Service: Plastics;  Laterality: Right;   Patient Active Problem List   Diagnosis Date Noted   [redacted] weeks gestation of pregnancy 04/12/2021   Bilateral posterior neck pain 06/12/2020   Tired 06/03/2019   History of ovarian cyst 06/03/2019   Nexplanon in place 06/03/2019   LLQ pain 06/03/2019   Irregular bleeding 06/03/2019   Pelvic pain 06/03/2019   Episodic tension-type headache, not intractable 01/07/2018   Migraine without aura and without status migrainosus, not intractable 07/23/2017   New daily persistent headache 02/27/2017   Concussion without loss of consciousness 02/27/2017    REFERRING DIAG: R10.2 (ICD-10-CM) - Pelvic and perineal pain  THERAPY DIAG:  Muscle weakness  (generalized)  Other muscle spasm  Unspecified lack of coordination  Rationale for Evaluation and Treatment Rehabilitation  PERTINENT HISTORY: Traumatic vaginal delivery with prolonged period in stirrups and hemorrhage  PRECAUTIONS: NA  SUBJECTIVE: Patient has most likely had miscarriage since last appointment, but they are going to recheck in a month if she does not get menstrual cycle. She feels like exercises are helping some and she feels like intercourse is getting more tolerable. Yes states that she is still having severe leaking and pain with urination.   PAIN:  Are you having pain? Yes  SUBJECTIVE:  SUBJECTIVE STATEMENT: Pt delivered son 04/13/21 and had hemorrhage where she was in stirrups for hours on end. After waking up she was in a lot of pain. She has pain with bowel movement and urination. She feels like she is urinating glass when she goes. Bowel movements still hurt in her vaginal area. She is unable to wear tampons due to pain. Unable to have intercourse.  Fluid intake: Yes: She drinks nothing but water and feels like she gets a lot.      Patient confirms identification and approves PT to assess pelvic floor and treatment Yes     PAIN:  Are you having pain? Yes Pelvic pain with above activities, but when her legs are crossed she will have pain with sitting and be very painful when she stands.       PATIENT GOALS to decrease pain     BOWEL MOVEMENT Pain with bowel movement: Yes Type of bowel movement:Frequency 1-2x/day, no straining Fully empty rectum: Yes: - Leakage: No Pads: No Fiber supplement: No   URINATION Pain with urination: Yes Fully empty bladder: No - post-void dribbling Stream:  intermittent  - has to strain hard to urinate Urgency: Yes: will leak if she does  not go immediately Frequency: every 60min to an hour Leakage: Urge to void, Coughing, Sneezing, Laughing, and sit<>stand Pads: Yes: wears liners on regular basis more form discharge   INTERCOURSE Pain with intercourse: Initial Penetration, During Penetration, and After Intercourse for 15-20 minutes; urinating after intercourse is very painful; they are using lubricant Ability to have vaginal penetration:  No Climax: not able to orgasm since delivery Marinoff Scale: 0/3   PREGNANCY Vaginal deliveries 1 Tearing Yes: 1st degree tear - stitches came out within 3 days C-section deliveries 0 Currently pregnant No     OBJECTIVE:    COGNITION:            Overall cognitive status: Within functional limits for tasks assessed                          SENSATION:            Light touch: Appears intact            Proprioception: Appears intact     GAIT: WNL   POSTURE:  WNL   PELVIC MMT: No active contraction palpated; with cuing and breath coordination, weak 1/5; no endurance          PALPATION:   General  2 finger width diastasis recti superior to umbilicus with mild coning                 External Perineal Exam redness/irritation labia minora                             Internal Pelvic Floor significant tenderness throughout superficial/deep pelvic floor with moderate restriction; tenderness worse Lt>Rt; perineal scar tissue restriction   TONE: Moderate   PROLAPSE: Gr 1 anterior vaginal wall laxity   TODAY'S TREATMENT 08/22/21 Manual: Myofascial release: External bladder release Internal pelvic floor techniques: No emotional/communication barriers or cognitive limitation. Patient is motivated to learn. Patient understands and agrees with treatment goals and plan. PT explains patient will be examined in standing, sitting, and lying down to see how their muscles and joints work. When they are ready, they will be asked to remove their underwear so PT can examine their perineum.  The patient  is also given the option of providing their own chaperone as one is not provided in our facility. The patient also has the right and is explained the right to defer or refuse any part of the evaluation or treatment including the internal exam. With the patient's consent, PT will use one gloved finger to gently assess the muscles of the pelvic floor, seeing how well it contracts and relaxes and if there is muscle symmetry. After, the patient will get dressed and PT and patient will discuss exam findings and plan of care. PT and patient discuss plan of care, schedule, attendance policy and HEP activities. Bil urethral mobilization and internal bladder mobilization Superficial pelvic floor muscle release Deep muscle release Lt>Rt Dry needling: Neuromuscular re-education: Down training: Diaphragmatic breathing for improved pelvic floor relaxation   TREATMENT 07/24/21 EVAL  Manual: Soft tissue mobilization: Scar tissue mobilization: Myofascial release: Spinal mobilization: Internal pelvic floor techniques: Vaginal exam No emotional/communication barriers or cognitive limitation. Patient is motivated to learn. Patient understands and agrees with treatment goals and plan. PT explains patient will be examined in standing, sitting, and lying down to see how their muscles and joints work. When they are ready, they will be asked to remove their underwear so PT can examine their perineum. The patient is also given the option of providing their own chaperone as one is not provided in our facility. The patient also has the right and is explained the right to defer or refuse any part of the evaluation or treatment including the internal exam. With the patient's consent, PT will use one gloved finger to gently assess the muscles of the pelvic floor, seeing how well it contracts and relaxes and if there is muscle symmetry. After, the patient will get dressed and PT and patient will discuss exam findings  and plan of care. PT and patient discuss plan of care, schedule, attendance policy and HEP activities.   Dry needling: Neuromuscular re-education: Core retraining:  Core facilitation: Form correction: Pelvic floor contraction training: Pelvic floor bulge and A/ROM training with breath coordination for improved circulation and relaxation Down training: Diaphragmatic breathing Child's pose 10 breaths Exercises: Stretches/mobility: Cat/cow 2 x 10 Bent knee fall out 5x bil Strengthening: Therapeutic activities: Functional strengthening activities: Self-care: Vulvovaginal massage       PATIENT EDUCATION:  Education details: Performed on manual techniques utilized in treatment Person educated: Patient Education method: Consulting civil engineer, Media planner, Corporate treasurer cues, Verbal cues, and Handouts Education comprehension: verbalized understanding     HOME EXERCISE PROGRAM: [id number]   ASSESSMENT:   CLINICAL IMPRESSION: Patient has seen some good improvements in pelvic pain, specifically with intercourse, since her first treatment session. Due to this, we did return to internal manual techniques in order to help improve pain with urination and leaking. Patient tolerated very well, but significant superficial pelvic floor restriction Bil with urethral restriction; good release throughout session. She demonstrated deep Lt sided pelvic floor tension that did release well with manual techniques. She was encouraged to continue working on HEP, focusing on diaphragmatic breathing. She will benefit from skilled PT intervention in order to decrease pain with bowel movements/urination/intercourse/tampon use, address impairments, and improve QOL.      OBJECTIVE IMPAIRMENTS decreased activity tolerance, decreased coordination, decreased endurance, decreased mobility, decreased ROM, decreased strength, hypomobility, increased fascial restrictions, increased muscle spasms, postural dysfunction, and pain.     ACTIVITY LIMITATIONS  urination/bowel movements, intercourse, tampon use .    PERSONAL FACTORS 1 comorbidity: traumatic vaginal delivery  are also affecting patient's functional  outcome.      REHAB POTENTIAL: Good   CLINICAL DECISION MAKING: Stable/uncomplicated   EVALUATION COMPLEXITY: Low     GOALS: Goals reviewed with patient? Yes   SHORT TERM GOALS: Target date: 08/21/2021   Pt will be independent with HEP.    Baseline: Goal status: INITIAL   2.  Pt will be able to correctly perform diaphragmatic breathing and appropriate pressure management in order to prevent worsening vaginal wall laxity and improve pelvic floor A/ROM.    Baseline:  Goal status: INITIAL   3.  Pt will be independent with the knack, urge suppression technique, and double voiding in order to improve bladder habits and decrease urinary incontinence.    Baseline:  Goal status: INITIAL     LONG TERM GOALS: Target date: 10/16/2021   Pt will be independent with advanced HEP.    Baseline:  Goal status: INITIAL   2.  Pt will demonstrate normal pelvic floor muscle tone and A/ROM, able to achieve 4/5 strength with contractions and 10 sec endurance, in order to provide appropriate lumbopelvic support in functional activities.    Baseline:  Goal status: INITIAL   3.  Pt will be able to sit, perform toileting activities, have intercourse, and use tampons without pelvic pain.  Baseline:  Goal status: INITIAL   4.  Pt will report no episodes of urinary incontinence in order to improve confidence in community activities and personal hygiene.    Baseline:  Goal status: INITIAL       PLAN: PT FREQUENCY: 1x/week   PT DURATION: 12 weeks   PLANNED INTERVENTIONS: Therapeutic exercises, Therapeutic activity, Neuromuscular re-education, Balance training, Gait training, Patient/Family education, Joint mobilization, Dry Needling, Biofeedback, and Manual therapy   PLAN FOR NEXT SESSION: Continue manual  techniques to tolerance; progress mobility exercises and down training.    Heather Roberts, PT, DPT05/24/234:15 PM

## 2021-08-30 ENCOUNTER — Ambulatory Visit: Payer: Medicaid Other

## 2021-08-30 NOTE — Therapy (Incomplete)
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   Hyperemesis gravidarum 12/21/2020   PCO (polycystic ovaries)    Renal disorder    kidney stones   Tachycardia    Urine retention    Vaginal Pap smear, abnormal    Past Surgical History:  Procedure Laterality Date   DILATION AND EVACUATION N/A 04/13/2021   Procedure: DILATATION AND EVACUATION;  Surgeon: Charlett Nose, MD;  Location: [facility];  Service: Gynecology;  Laterality: N/A;   TENDON REPAIR Right 05/15/2018   Procedure: EXTENSOR TENDON REPAIR RIGHT THUMB;  Surgeon: Knute Neu, MD;  Location: [facility] OR;  Service: Plastics;  Laterality: Right;   Patient Active Problem List   Diagnosis Date Noted   [redacted] weeks gestation of pregnancy 04/12/2021   Bilateral posterior neck pain 06/12/2020   Tired 06/03/2019   History of ovarian cyst 06/03/2019   Nexplanon in place 06/03/2019   LLQ pain 06/03/2019   Irregular bleeding 06/03/2019   Pelvic pain 06/03/2019   Episodic tension-type headache, not intractable 01/07/2018   Migraine without aura and without status migrainosus, not intractable 07/23/2017   New daily persistent headache 02/27/2017   Concussion without loss of consciousness 02/27/2017    REFERRING DIAG: R10.2 (ICD-10-CM) - Pelvic and perineal pain  THERAPY DIAG:  No diagnosis found.  Rationale for Evaluation and Treatment Rehabilitation  PERTINENT HISTORY: Traumatic vaginal delivery with prolonged period in stirrups and hemorrhage  PRECAUTIONS: NA  SUBJECTIVE: Patient has most likely had miscarriage since last appointment, but they are going to recheck in a month if she does not get menstrual cycle. She feels like exercises are  helping some and she feels like intercourse is getting more tolerable. Yes states that she is still having severe leaking and pain with urination.   PAIN:  Are you having pain? Yes  SUBJECTIVE:                                                                                                                                                                                            SUBJECTIVE STATEMENT: Pt delivered son 04/13/21 and had hemorrhage where she was in stirrups for hours on end. After waking up she was in a lot of pain. She has pain with bowel movement and urination. She feels like she is urinating glass when she  goes. Bowel movements still hurt in her vaginal area. She is unable to wear tampons due to pain. Unable to have intercourse.  Fluid intake: Yes: She drinks nothing but water and feels like she gets a lot.      Patient confirms identification and approves PT to assess pelvic floor and treatment Yes     PAIN:  Are you having pain? Yes Pelvic pain with above activities, but when her legs are crossed she will have pain with sitting and be very painful when she stands.       PATIENT GOALS to decrease pain     BOWEL MOVEMENT Pain with bowel movement: Yes Type of bowel movement:Frequency 1-2x/day, no straining Fully empty rectum: Yes: - Leakage: No Pads: No Fiber supplement: No   URINATION Pain with urination: Yes Fully empty bladder: No - post-void dribbling Stream:  intermittent  - has to strain hard to urinate Urgency: Yes: will leak if she does not go immediately Frequency: every 45min to an hour Leakage: Urge to void, Coughing, Sneezing, Laughing, and sit<>stand Pads: Yes: wears liners on regular basis more form discharge   INTERCOURSE Pain with intercourse: Initial Penetration, During Penetration, and After Intercourse for 15-20 minutes; urinating after intercourse is very painful; they are using lubricant Ability to have vaginal penetration:  No Climax: not  able to orgasm since delivery Marinoff Scale: 0/3   PREGNANCY Vaginal deliveries 1 Tearing Yes: 1st degree tear - stitches came out within 3 days C-section deliveries 0 Currently pregnant No     OBJECTIVE:    COGNITION:            Overall cognitive status: Within functional limits for tasks assessed                          SENSATION:            Light touch: Appears intact            Proprioception: Appears intact     GAIT: WNL   POSTURE:  WNL   PELVIC MMT: No active contraction palpated; with cuing and breath coordination, weak 1/5; no endurance          PALPATION:   General  2 finger width diastasis recti superior to umbilicus with mild coning                 External Perineal Exam redness/irritation labia minora                             Internal Pelvic Floor significant tenderness throughout superficial/deep pelvic floor with moderate restriction; tenderness worse Lt>Rt; perineal scar tissue restriction   TONE: Moderate   PROLAPSE: Gr 1 anterior vaginal wall laxity   TODAY'S TREATMENT 08/30/21 Manual: Soft tissue mobilization: Scar tissue mobilization: Myofascial release: Spinal mobilization: Internal pelvic floor techniques: Dry needling: Neuromuscular re-education: Core retraining:  Core facilitation: Form correction: Pelvic floor contraction training: Down training: Exercises: Stretches/mobility: Strengthening: Therapeutic activities: Functional strengthening activities: Self-care:    TREATMENT 08/22/21 Manual: Myofascial release: External bladder release Internal pelvic floor techniques: No emotional/communication barriers or cognitive limitation. Patient is motivated to learn. Patient understands and agrees with treatment goals and plan. PT explains patient will be examined in standing, sitting, and lying down to see how their muscles and joints work. When they are ready, they will be asked to remove their underwear so PT can examine their  perineum. The  patient is also given the option of providing their own chaperone as one is not provided in our facility. The patient also has the right and is explained the right to defer or refuse any part of the evaluation or treatment including the internal exam. With the patient's consent, PT will use one gloved finger to gently assess the muscles of the pelvic floor, seeing how well it contracts and relaxes and if there is muscle symmetry. After, the patient will get dressed and PT and patient will discuss exam findings and plan of care. PT and patient discuss plan of care, schedule, attendance policy and HEP activities. Bil urethral mobilization and internal bladder mobilization Superficial pelvic floor muscle release Deep muscle release Lt>Rt Dry needling: Neuromuscular re-education: Down training: Diaphragmatic breathing for improved pelvic floor relaxation   TREATMENT 07/24/21 EVAL  Manual: Soft tissue mobilization: Scar tissue mobilization: Myofascial release: Spinal mobilization: Internal pelvic floor techniques: Vaginal exam No emotional/communication barriers or cognitive limitation. Patient is motivated to learn. Patient understands and agrees with treatment goals and plan. PT explains patient will be examined in standing, sitting, and lying down to see how their muscles and joints work. When they are ready, they will be asked to remove their underwear so PT can examine their perineum. The patient is also given the option of providing their own chaperone as one is not provided in our facility. The patient also has the right and is explained the right to defer or refuse any part of the evaluation or treatment including the internal exam. With the patient's consent, PT will use one gloved finger to gently assess the muscles of the pelvic floor, seeing how well it contracts and relaxes and if there is muscle symmetry. After, the patient will get dressed and PT and patient will discuss  exam findings and plan of care. PT and patient discuss plan of care, schedule, attendance policy and HEP activities.   Dry needling: Neuromuscular re-education: Core retraining:  Core facilitation: Form correction: Pelvic floor contraction training: Pelvic floor bulge and A/ROM training with breath coordination for improved circulation and relaxation Down training: Diaphragmatic breathing Child's pose 10 breaths Exercises: Stretches/mobility: Cat/cow 2 x 10 Bent knee fall out 5x bil Strengthening: Therapeutic activities: Functional strengthening activities: Self-care: Vulvovaginal massage       PATIENT EDUCATION:  Education details: Performed on manual techniques utilized in treatment Person educated: Patient Education method: Programmer, multimedia, Facilities manager, Actor cues, Verbal cues, and Handouts Education comprehension: verbalized understanding     HOME EXERCISE PROGRAM: [id number]   ASSESSMENT:   CLINICAL IMPRESSION:  She will benefit from skilled PT intervention in order to decrease pain with bowel movements/urination/intercourse/tampon use, address impairments, and improve QOL.      OBJECTIVE IMPAIRMENTS decreased activity tolerance, decreased coordination, decreased endurance, decreased mobility, decreased ROM, decreased strength, hypomobility, increased fascial restrictions, increased muscle spasms, postural dysfunction, and pain.    ACTIVITY LIMITATIONS  urination/bowel movements, intercourse, tampon use .    PERSONAL FACTORS 1 comorbidity: traumatic vaginal delivery  are also affecting patient's functional outcome.      REHAB POTENTIAL: Good   CLINICAL DECISION MAKING: Stable/uncomplicated   EVALUATION COMPLEXITY: Low     GOALS: Goals reviewed with patient? Yes   SHORT TERM GOALS: Target date: 08/21/2021   Pt will be independent with HEP.    Baseline: Goal status: INITIAL   2.  Pt will be able to correctly perform diaphragmatic breathing and  appropriate pressure management in order to prevent worsening vaginal wall laxity and improve  pelvic floor A/ROM.    Baseline:  Goal status: INITIAL   3.  Pt will be independent with the knack, urge suppression technique, and double voiding in order to improve bladder habits and decrease urinary incontinence.    Baseline:  Goal status: INITIAL     LONG TERM GOALS: Target date: 10/16/2021   Pt will be independent with advanced HEP.    Baseline:  Goal status: INITIAL   2.  Pt will demonstrate normal pelvic floor muscle tone and A/ROM, able to achieve 4/5 strength with contractions and 10 sec endurance, in order to provide appropriate lumbopelvic support in functional activities.    Baseline:  Goal status: INITIAL   3.  Pt will be able to sit, perform toileting activities, have intercourse, and use tampons without pelvic pain.  Baseline:  Goal status: INITIAL   4.  Pt will report no episodes of urinary incontinence in order to improve confidence in community activities and personal hygiene.    Baseline:  Goal status: INITIAL       PLAN: PT FREQUENCY: 1x/week   PT DURATION: 12 weeks   PLANNED INTERVENTIONS: Therapeutic exercises, Therapeutic activity, Neuromuscular re-education, Balance training, Gait training, Patient/Family education, Joint mobilization, Dry Needling, Biofeedback, and Manual therapy   PLAN FOR NEXT SESSION: Continue manual techniques to tolerance; progress mobility exercises and down training.    Julio Alm, PT, DPT06/04/2310:21 PM

## 2021-09-04 ENCOUNTER — Ambulatory Visit: Payer: Medicaid Other | Attending: Obstetrics and Gynecology

## 2021-09-04 DIAGNOSIS — M62838 Other muscle spasm: Secondary | ICD-10-CM | POA: Diagnosis present

## 2021-09-04 DIAGNOSIS — R279 Unspecified lack of coordination: Secondary | ICD-10-CM | POA: Diagnosis present

## 2021-09-04 DIAGNOSIS — M6281 Muscle weakness (generalized): Secondary | ICD-10-CM | POA: Insufficient documentation

## 2021-09-04 NOTE — Therapy (Signed)
OUTPATIENT PHYSICAL THERAPY TREATMENT NOTE   Patient Name: Hannah Walker MRN: [id number] DOB:[birth year], 21 y.o., female Today's Date: 09/04/2021  PCP: None REFERRING PROVIDER: Philip Aspen, DO  END OF SESSION:   PT End of Session - 09/04/21 1444     Visit Number 3    Date for PT Re-Evaluation 10/16/21    Authorization Type [employer]    PT Start Time 1445    PT Stop Time 1530    PT Time Calculation (min) 45 min    Activity Tolerance Patient tolerated treatment well    Behavior During Therapy [facility] for tasks assessed/performed              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   Hyperemesis gravidarum 12/21/2020   PCO (polycystic ovaries)    Renal disorder    kidney stones   Tachycardia    Urine retention    Vaginal Pap smear, abnormal    Past Surgical History:  Procedure Laterality Date   DILATION AND EVACUATION N/A 04/13/2021   Procedure: DILATATION AND EVACUATION;  Surgeon: Charlett Nose, MD;  Location: [facility] OR[facility];  Service: Gynecology;  Laterality: N/A;   TENDON REPAIR Right 05/15/2018   Procedure: EXTENSOR TENDON REPAIR RIGHT THUMB;  Surgeon: Knute Neu, MD;  Location: [facility] OR;  Service: Plastics;  Laterality: Right;   Patient Active Problem List   Diagnosis Date Noted   [redacted] weeks gestation of pregnancy 04/12/2021   Bilateral posterior neck pain 06/12/2020   Tired 06/03/2019   History of ovarian cyst 06/03/2019   Nexplanon in place 06/03/2019   LLQ pain 06/03/2019   Irregular bleeding 06/03/2019   Pelvic pain 06/03/2019   Episodic tension-type headache, not intractable 01/07/2018   Migraine without aura and without status migrainosus, not intractable 07/23/2017   New daily persistent headache 02/27/2017   Concussion without loss of consciousness 02/27/2017    REFERRING DIAG: R10.2 (ICD-10-CM) - Pelvic and perineal pain  THERAPY DIAG:  Muscle weakness  (generalized)  Other muscle spasm  Unspecified lack of coordination  Rationale for Evaluation and Treatment Rehabilitation  PERTINENT HISTORY: Traumatic vaginal delivery with prolonged period in stirrups and hemorrhage  PRECAUTIONS: NA  SUBJECTIVE: Patient states that she had significant pain and soreness after last treatment session, just when she was urinating. After several days, she returned to baseline. Pain will increase to 7/10, but she is currently in 0/10 pain.   PAIN:  Are you having pain? Yes  SUBJECTIVE:                                                                          SUBJECTIVE STATEMENT: Pt delivered son 04/13/21 and had hemorrhage where she was in stirrups for hours on end. After waking up she was in a lot of pain. She has pain with bowel movement and urination. She feels like she is urinating glass when she goes. Bowel movements still hurt in her vaginal area. She is unable to wear tampons due to pain. Unable to have intercourse.  Fluid intake: Yes: She drinks nothing but water and feels like she gets a lot.  Patient confirms identification and approves PT to assess pelvic floor and treatment Yes     PAIN:  Are you having pain? Yes Pelvic pain with above activities, but when her legs are crossed she will have pain with sitting and be very painful when she stands.       PATIENT GOALS to decrease pain     BOWEL MOVEMENT Pain with bowel movement: Yes Type of bowel movement:Frequency 1-2x/day, no straining Fully empty rectum: Yes: - Leakage: No Pads: No Fiber supplement: No   URINATION Pain with urination: Yes Fully empty bladder: No - post-void dribbling Stream:  intermittent  - has to strain hard to urinate Urgency: Yes: will leak if she does not go immediately Frequency: every 45min to an hour Leakage: Urge to void, Coughing, Sneezing, Laughing, and sit<>stand Pads: Yes: wears liners on regular basis more form discharge   INTERCOURSE Pain  with intercourse: Initial Penetration, During Penetration, and After Intercourse for 15-20 minutes; urinating after intercourse is very painful; they are using lubricant Ability to have vaginal penetration:  No Climax: not able to orgasm since delivery Marinoff Scale: 0/3   PREGNANCY Vaginal deliveries 1 Tearing Yes: 1st degree tear - stitches came out within 3 days C-section deliveries 0 Currently pregnant No     OBJECTIVE:    COGNITION:            Overall cognitive status: Within functional limits for tasks assessed                          SENSATION:            Light touch: Appears intact            Proprioception: Appears intact     GAIT: WNL   POSTURE:  WNL   PELVIC MMT: No active contraction palpated; with cuing and breath coordination, weak 1/5; no endurance          PALPATION:   General  2 finger width diastasis recti superior to umbilicus with mild coning                 External Perineal Exam redness/irritation labia minora                             Internal Pelvic Floor significant tenderness throughout superficial/deep pelvic floor with moderate restriction; tenderness worse Lt>Rt; perineal scar tissue restriction   TONE: Moderate   PROLAPSE: Gr 1 anterior vaginal wall laxity   TODAY'S TREATMENT 09/04/21 Manual: Myofascial release: External bladder release Internal pelvic floor techniques: No emotional/communication barriers or cognitive limitation. Patient is motivated to learn. Patient understands and agrees with treatment goals and plan. PT explains patient will be examined in standing, sitting, and lying down to see how their muscles and joints work. When they are ready, they will be asked to remove their underwear so PT can examine their perineum. The patient is also given the option of providing their own chaperone as one is not provided in our facility. The patient also has the right and is explained the right to defer or refuse any part of the  evaluation or treatment including the internal exam. With the patient's consent, PT will use one gloved finger to gently assess the muscles of the pelvic floor, seeing how well it contracts and relaxes and if there is muscle symmetry. After, the patient will get dressed and PT and patient will discuss  exam findings and plan of care. PT and patient discuss plan of care, schedule, attendance policy and HEP activities. Bil urethral mobilization and internal bladder mobilization Superficial pelvic floor muscle release Deep muscle release Lt>Rt Exercises: Stretches/mobility: Hip flexor stretch kneeling 60 sec bil Hip flexor stretch kneeling with arm elevated 60 sec bil Open books 10x bil Pigeon 10 breaths bil Piriformis stretch 60 sec bil   TREATMENT 08/22/21 Manual: Myofascial release: External bladder release Internal pelvic floor techniques: No emotional/communication barriers or cognitive limitation. Patient is motivated to learn. Patient understands and agrees with treatment goals and plan. PT explains patient will be examined in standing, sitting, and lying down to see how their muscles and joints work. When they are ready, they will be asked to remove their underwear so PT can examine their perineum. The patient is also given the option of providing their own chaperone as one is not provided in our facility. The patient also has the right and is explained the right to defer or refuse any part of the evaluation or treatment including the internal exam. With the patient's consent, PT will use one gloved finger to gently assess the muscles of the pelvic floor, seeing how well it contracts and relaxes and if there is muscle symmetry. After, the patient will get dressed and PT and patient will discuss exam findings and plan of care. PT and patient discuss plan of care, schedule, attendance policy and HEP activities. Bil urethral mobilization and internal bladder mobilization Superficial pelvic floor  muscle release Deep muscle release Lt>Rt Dry needling: Neuromuscular re-education: Down training: Diaphragmatic breathing for improved pelvic floor relaxation   TREATMENT 07/24/21 EVAL  Manual: Soft tissue mobilization: Scar tissue mobilization: Myofascial release: Spinal mobilization: Internal pelvic floor techniques: Vaginal exam No emotional/communication barriers or cognitive limitation. Patient is motivated to learn. Patient understands and agrees with treatment goals and plan. PT explains patient will be examined in standing, sitting, and lying down to see how their muscles and joints work. When they are ready, they will be asked to remove their underwear so PT can examine their perineum. The patient is also given the option of providing their own chaperone as one is not provided in our facility. The patient also has the right and is explained the right to defer or refuse any part of the evaluation or treatment including the internal exam. With the patient's consent, PT will use one gloved finger to gently assess the muscles of the pelvic floor, seeing how well it contracts and relaxes and if there is muscle symmetry. After, the patient will get dressed and PT and patient will discuss exam findings and plan of care. PT and patient discuss plan of care, schedule, attendance policy and HEP activities.   Dry needling: Neuromuscular re-education: Core retraining:  Core facilitation: Form correction: Pelvic floor contraction training: Pelvic floor bulge and A/ROM training with breath coordination for improved circulation and relaxation Down training: Diaphragmatic breathing Child's pose 10 breaths Exercises: Stretches/mobility: Cat/cow 2 x 10 Bent knee fall out 5x bil Strengthening: Therapeutic activities: Functional strengthening activities: Self-care: Vulvovaginal massage       PATIENT EDUCATION:  Education details: Performed on manual techniques utilized in treatment and  HEP progressions Person educated: Patient Education method: Programmer, multimedia, Facilities manager, Actor cues, Verbal cues, and Handouts Education comprehension: verbalized understanding     HOME EXERCISE PROGRAM: [email or web address]   ASSESSMENT:   CLINICAL IMPRESSION: Patient having had increased pain after last treatment sessions and spasms in perineal body. Mobility exercises progressed to  improve anterior/medial/posterior pelvic muscle attachments mobility and reduce perineal spasm. Manual techniques focused on scar tissue restriction and perineal restriction. Some urethral/bladder mobilization performed, but less in order to reduce residual soreness/irritation. No increase in pain reported at end of treatment session. She will benefit from skilled PT intervention in order to decrease pain with bowel movements/urination/intercourse/tampon use, address impairments, and improve QOL.      OBJECTIVE IMPAIRMENTS decreased activity tolerance, decreased coordination, decreased endurance, decreased mobility, decreased ROM, decreased strength, hypomobility, increased fascial restrictions, increased muscle spasms, postural dysfunction, and pain.    ACTIVITY LIMITATIONS  urination/bowel movements, intercourse, tampon use .    PERSONAL FACTORS 1 comorbidity: traumatic vaginal delivery  are also affecting patient's functional outcome.      REHAB POTENTIAL: Good   CLINICAL DECISION MAKING: Stable/uncomplicated   EVALUATION COMPLEXITY: Low     GOALS: Goals reviewed with patient? Yes   SHORT TERM GOALS: Target date: 08/21/2021   Pt will be independent with HEP.    Baseline: Goal status: INITIAL   2.  Pt will be able to correctly perform diaphragmatic breathing and appropriate pressure management in order to prevent worsening vaginal wall laxity and improve pelvic floor A/ROM.    Baseline:  Goal status: INITIAL   3.  Pt will be independent with the knack, urge suppression technique, and double voiding in  order to improve bladder habits and decrease urinary incontinence.    Baseline:  Goal status: INITIAL     LONG TERM GOALS: Target date: 10/16/2021   Pt will be independent with advanced HEP.    Baseline:  Goal status: INITIAL   2.  Pt will demonstrate normal pelvic floor muscle tone and A/ROM, able to achieve 4/5 strength with contractions and 10 sec endurance, in order to provide appropriate lumbopelvic support in functional activities.    Baseline:  Goal status: INITIAL   3.  Pt will be able to sit, perform toileting activities, have intercourse, and use tampons without pelvic pain.  Baseline:  Goal status: INITIAL   4.  Pt will report no episodes of urinary incontinence in order to improve confidence in community activities and personal hygiene.    Baseline:  Goal status: INITIAL       PLAN: PT FREQUENCY: 1x/week   PT DURATION: 12 weeks   PLANNED INTERVENTIONS: Therapeutic exercises, Therapeutic activity, Neuromuscular re-education, Balance training, Gait training, Patient/Family education, Joint mobilization, Dry Needling, Biofeedback, and Manual therapy   PLAN FOR NEXT SESSION: Continue manual techniques to tolerance; progress mobility exercises and down training.    Julio Alm, PT, DPT06/06/233:39 PM

## 2021-09-07 ENCOUNTER — Ambulatory Visit
Admission: RE | Admit: 2021-09-07 | Discharge: 2021-09-07 | Disposition: A | Discharge status: Home / Self Care | Payer: Medicaid Other | Source: Ambulatory Visit | Attending: Nurse Practitioner | Admitting: Nurse Practitioner

## 2021-09-07 ENCOUNTER — Other Ambulatory Visit: Payer: Self-pay | Admitting: Nurse Practitioner

## 2021-09-07 VITALS — BP 115/74 | HR 111 | Temp 98.1°F | Resp 16

## 2021-09-07 DIAGNOSIS — Z3201 Encounter for pregnancy test, result positive: Secondary | ICD-10-CM | POA: Diagnosis not present

## 2021-09-07 DIAGNOSIS — N926 Irregular menstruation, unspecified: Secondary | ICD-10-CM

## 2021-09-07 LAB — POCT URINE PREGNANCY: Preg Test, Ur: POSITIVE — AB

## 2021-09-07 MED ORDER — DOXYLAMINE-PYRIDOXINE 10-10 MG PO TBEC: 1.0000 | DELAYED_RELEASE_TABLET | Freq: Every day | ORAL | 0 refills | Status: AC

## 2021-09-07 NOTE — ED Triage Notes (Signed)
Pt reports nausea, vomiting and diarrhea x 2 days; menstrual period is 5 days late. Pt reports she started having a miscarriage on 08/09/21 and was confirmed on 08/02/21. T requested pregnancy test as she had  pregnancy test with a faint line today.

## 2021-09-07 NOTE — Discharge Instructions (Addendum)
-   The urine pregnancy test today is positive; we are also doing the blood test and you will see these results on Mychart - Please start Diclegis at night time for morning sickness; take this every night.  You can increase to 2 tablets if not helpful - Follow up with OB/GYN as planned

## 2021-09-07 NOTE — ED Provider Notes (Signed)
[facility]    CSN: [id number] Arrival date & time: 09/07/21  1707      History   Chief Complaint Chief Complaint  Patient presents with   Possible Pregnancy    Previous miscarriage on 5/14. 5 days late for normal cycle faint positive test this morning. Nausea, vomiting and occasional diarrhea. - Entered by patient   Nausea   Emesis   Diarrhea    HPI Hannah Walker is a 21 y.o. female.   Patient presents today with nausea, vomiting, and diarrhea.  Thinks she may be pregnant-had a faint positive line on a pregnancy test at home this morning; she recently had a miscarriage about 1 month ago.  Reports she is 5 days late for her menstrual cycle.  Patient reports history of PCOS, she is present with her infant child today and reports she had a miscarriage prior to her pregnancy with this child as well.  She is requesting quantitative hCG testing.    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   Hyperemesis gravidarum 12/21/2020   PCO (polycystic ovaries)    Renal disorder    kidney stones   Tachycardia    Urine retention    Vaginal Pap smear, abnormal     Patient Active Problem List   Diagnosis Date Noted   [redacted] weeks gestation of pregnancy 04/12/2021   Bilateral posterior neck pain 06/12/2020   Tired 06/03/2019   History of ovarian cyst 06/03/2019   Nexplanon in place 06/03/2019   LLQ pain 06/03/2019   Irregular bleeding 06/03/2019   Pelvic pain 06/03/2019   Episodic tension-type headache, not intractable 01/07/2018   Migraine without aura and without status migrainosus, not intractable 07/23/2017   New daily persistent headache 02/27/2017   Concussion without loss of consciousness 02/27/2017    Past Surgical History:  Procedure Laterality Date   DILATION AND EVACUATION N/A 04/13/2021   Procedure: DILATATION AND EVACUATION;  Surgeon: Charlett Nose, MD;  Location: [facility];  Service:  Gynecology;  Laterality: N/A;   TENDON REPAIR Right 05/15/2018   Procedure: EXTENSOR TENDON REPAIR RIGHT THUMB;  Surgeon: Knute Neu, MD;  Location: [facility];  Service: Plastics;  Laterality: Right;    OB History     Gravida  4   Para  1   Term  1   Preterm  0   AB  2   Living  1      SAB  2   IAB  0   Ectopic  0   Multiple  0   Live Births  1            Home Medications    Prior to Admission medications   Medication Sig Start Date End Date Taking? Authorizing Provider  Doxylamine-Pyridoxine 10-10 MG TBEC Take 1 tablet by mouth at bedtime. 09/07/21 10/07/21 Yes Valentino Nose, NP  acetaminophen (TYLENOL) 500 MG tablet Take 1,000 mg by mouth every 6 (six) hours as needed.    [provider]  ALPRAZolam Prudy Feeler) 0.25 MG tablet Take 0.25 mg by mouth 2 (two) times daily as needed for anxiety.    [provider]  Prenatal Vit-Fe Fumarate-FA (PREPLUS) 27-1 MG TABS Take by mouth.    [provider]  sertraline (ZOLOFT) 100 MG tablet Take 100 mg by mouth daily.    [provider]  ESTARYLLA 0.25-35 MG-MCG tablet TK 1  T PO D 04/08/18 02/18/19  [provider]  etonogestrel (NEXPLANON) 68 MG IMPL implant Nexplanon 68 mg subdermal implant  Inject 1 implant by subcutaneous route.  11/16/19  [provider]  topiramate (TOPAMAX) 25 MG tablet TAKE 4 TABLETS BY MOUTH AT NIGHT TIME 11/17/18 02/18/19  Deetta PerlaHickling, William H, MD    Family History Family History  Problem Relation Age of Onset   Healthy Mother    Thyroid disease Mother    Healthy Father    Thyroid disease Maternal Grandmother     Social History Social History   Tobacco Use   Smoking status: Former    Types: E-cigarettes    Quit date: 07/30/2020    Years since quitting: 1.1   Smokeless tobacco: Never  Vaping Use   Vaping Use: Former   Quit date: 08/13/2020   Substances: Nicotine  Substance Use Topics   Alcohol use: No   Drug use: No     Allergies    Ampicillin and Penicillins   Review of Systems Review of Systems Per HPI  Physical Exam Triage Vital Signs ED Triage Vitals [09/07/21 1728]  Enc Vitals Group     BP 115/74     Pulse Rate (!) 111     Resp 16     Temp 98.1 F (36.7 C)     Temp Source Oral     SpO2 98 %     Weight      Height      Head Circumference      Peak Flow      Pain Score 0     Pain Loc      Pain Edu?      Excl. in GC?    No data found.  Updated Vital Signs BP 115/74 (BP Location: Right Arm)   Pulse (!) 111   Temp 98.1 F (36.7 C) (Oral)   Resp 16   LMP 07/02/2021   SpO2 98%   Breastfeeding No   Visual Acuity Right Eye Distance:   Left Eye Distance:   Bilateral Distance:    Right Eye Near:   Left Eye Near:    Bilateral Near:     Physical Exam Vitals and nursing note reviewed.  Constitutional:      General: She is not in acute distress.    Appearance: Normal appearance. She is not toxic-appearing.  Eyes:     General: No scleral icterus.    Extraocular Movements: Extraocular movements intact.  Pulmonary:     Effort: Pulmonary effort is normal. No respiratory distress.  Skin:    General: Skin is warm and dry.     Capillary Refill: Capillary refill takes less than 2 seconds.     Coloration: Skin is not jaundiced or pale.     Findings: No erythema.  Neurological:     Mental Status: She is alert and oriented to person, place, and time.  Psychiatric:        Behavior: Behavior is cooperative.      UC Treatments / Results  Labs (all labs ordered are listed, but only abnormal results are displayed) Labs Reviewed  POCT URINE PREGNANCY - Abnormal; Notable for the following components:      Result Value   Preg Test, Ur Positive (*)    All other components within normal limits  HCG, QUANTITATIVE, PREGNANCY    EKG   Radiology No results found.  Procedures Procedures (including critical care time)  Medications Ordered in UC Medications - No data to  display  Initial  Impression / Assessment and Plan / UC Course  I have reviewed the triage vital signs and the nursing notes.  Pertinent labs & imaging results that were available during my care of the patient were reviewed by me and considered in my medical decision making (see chart for details).    Urine pregnancy test is positive today.  We will also obtain quantitative hCG testing.  Encouraged starting on a prenatal vitamin.  We will send in Diclegis for morning sickness.  Patient already has an appointment with her OB/GYN-encouraged her to keep this appointment.  ER precautions discussed if she develops vaginal bleeding or symptoms of a miscarriage.  The patient was given the opportunity to ask questions.  All questions answered to their satisfaction.  The patient is in agreement to this plan.   Final Clinical Impressions(s) / UC Diagnoses   Final diagnoses:  Missed menses  Pregnancy test positive     Discharge Instructions      - The urine pregnancy test today is positive; we are also doing the blood test and you will see these results on Mychart - Please start Diclegis at night time for morning sickness; take this every night.  You can increase to 2 tablets if not helpful - Follow up with OB/GYN as planned    ED Prescriptions     Medication Sig Dispense Auth. Provider   Doxylamine-Pyridoxine 10-10 MG TBEC Take 1 tablet by mouth at bedtime. 60 tablet Valentino Nose, NP      PDMP not reviewed this encounter.   Valentino Nose, NP 09/07/21 1815

## 2021-09-11 ENCOUNTER — Ambulatory Visit: Payer: Medicaid Other

## 2021-09-11 DIAGNOSIS — M6281 Muscle weakness (generalized): Secondary | ICD-10-CM

## 2021-09-11 DIAGNOSIS — R279 Unspecified lack of coordination: Secondary | ICD-10-CM

## 2021-09-11 DIAGNOSIS — M62838 Other muscle spasm: Secondary | ICD-10-CM

## 2021-09-11 NOTE — Therapy (Signed)
OUTPATIENT PHYSICAL THERAPY TREATMENT NOTE   Patient Name: Hannah Walker MRN: [id number] DOB:2001-03-06, 21 y.o., female Today's Date: 09/11/2021  PCP: None REFERRING PROVIDER: Allyn Kenner, DO  END OF SESSION:   PT End of Session - 09/11/21 0934     Visit Number 4    Date for PT Re-Evaluation 10/16/21    Authorization Type [employer]    PT Start Time 0930    PT Stop Time 1010    PT Time Calculation (min) 40 min    Activity Tolerance Patient tolerated treatment well    Behavior During Therapy [facility] for tasks assessed/performed               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   Hyperemesis gravidarum 12/21/2020   PCO (polycystic ovaries)    Renal disorder    kidney stones   Tachycardia    Urine retention    Vaginal Pap smear, abnormal    Past Surgical History:  Procedure Laterality Date   DILATION AND EVACUATION N/A 04/13/2021   Procedure: DILATATION AND EVACUATION;  Surgeon: Rowland Lathe, MD;  Location: [facility];  Service: Gynecology;  Laterality: N/A;   TENDON REPAIR Right 05/15/2018   Procedure: EXTENSOR TENDON REPAIR RIGHT THUMB;  Surgeon: Dayna Barker, MD;  Location: [place];  Service: Plastics;  Laterality: Right;   Patient Active Problem List   Diagnosis Date Noted   [redacted] weeks gestation of pregnancy 04/12/2021   Bilateral posterior neck pain 06/12/2020   Tired 06/03/2019   History of ovarian cyst 06/03/2019   Nexplanon in place 06/03/2019   LLQ pain 06/03/2019   Irregular bleeding 06/03/2019   Pelvic pain 06/03/2019   Episodic tension-type headache, not intractable 01/07/2018   Migraine without aura and without status migrainosus, not intractable 07/23/2017   New daily persistent headache 02/27/2017   Concussion without loss of consciousness 02/27/2017    REFERRING DIAG: R10.2 (ICD-10-CM) - Pelvic and perineal pain  THERAPY DIAG:  Muscle weakness  (generalized)  Other muscle spasm  Unspecified lack of coordination  Rationale for Evaluation and Treatment Rehabilitation  PERTINENT HISTORY: Traumatic vaginal delivery with prolonged period in stirrups and hemorrhage  PRECAUTIONS: NA  SUBJECTIVE: Patient states that she is 5.[redacted] weeks pregnant. She is no longer having severe morning sickness. She is having diarrhea that was typical for her in the beginning of pregnancy. She feels like she is noticing some improvement in pain with urination - she is not noticing pain every time she urinates, but it is still severe when it does hit.   PAIN:  Are you having pain? Yes  SUBJECTIVE:                                                                          SUBJECTIVE STATEMENT: Pt delivered son 04/13/21 and had hemorrhage where she was in stirrups for hours on end. After waking up she was in a lot of pain. She has pain with bowel movement and urination. She feels like she is urinating glass when she goes. Bowel movements still hurt in her vaginal area. She is unable to wear tampons  due to pain. Unable to have intercourse.  Fluid intake: Yes: She drinks nothing but water and feels like she gets a lot.      Patient confirms identification and approves PT to assess pelvic floor and treatment Yes     PAIN:  Are you having pain? Yes Pelvic pain with above activities, but when her legs are crossed she will have pain with sitting and be very painful when she stands.       PATIENT GOALS to decrease pain     BOWEL MOVEMENT Pain with bowel movement: Yes Type of bowel movement:Frequency 1-2x/day, no straining Fully empty rectum: Yes: - Leakage: No Pads: No Fiber supplement: No   URINATION Pain with urination: Yes Fully empty bladder: No - post-void dribbling Stream:  intermittent  - has to strain hard to urinate Urgency: Yes: will leak if she does not go immediately Frequency: every 63min to an hour Leakage: Urge to void, Coughing,  Sneezing, Laughing, and sit<>stand Pads: Yes: wears liners on regular basis more form discharge   INTERCOURSE Pain with intercourse: Initial Penetration, During Penetration, and After Intercourse for 15-20 minutes; urinating after intercourse is very painful; they are using lubricant Ability to have vaginal penetration:  No Climax: not able to orgasm since delivery Marinoff Scale: 0/3   PREGNANCY Vaginal deliveries 1 Tearing Yes: 1st degree tear - stitches came out within 3 days C-section deliveries 0 Currently pregnant No     OBJECTIVE:    COGNITION:            Overall cognitive status: Within functional limits for tasks assessed                          SENSATION:            Light touch: Appears intact            Proprioception: Appears intact     GAIT: WNL   POSTURE:  WNL   PELVIC MMT: No active contraction palpated; with cuing and breath coordination, weak 1/5; no endurance          PALPATION:   General  2 finger width diastasis recti superior to umbilicus with mild coning                 External Perineal Exam redness/irritation labia minora                             Internal Pelvic Floor significant tenderness throughout superficial/deep pelvic floor with moderate restriction; tenderness worse Lt>Rt; perineal scar tissue restriction   TONE: Moderate   PROLAPSE: Gr 1 anterior vaginal wall laxity   TODAY'S TREATMENT 09/11/21 Manual: Internal pelvic floor techniques: No emotional/communication barriers or cognitive limitation. Patient is motivated to learn. Patient understands and agrees with treatment goals and plan. PT explains patient will be examined in standing, sitting, and lying down to see how their muscles and joints work. When they are ready, they will be asked to remove their underwear so PT can examine their perineum. The patient is also given the option of providing their own chaperone as one is not provided in our facility. The patient also has the  right and is explained the right to defer or refuse any part of the evaluation or treatment including the internal exam. With the patient's consent, PT will use one gloved finger to gently assess the muscles of the pelvic floor, seeing how  well it contracts and relaxes and if there is muscle symmetry. After, the patient will get dressed and PT and patient will discuss exam findings and plan of care. PT and patient discuss plan of care, schedule, attendance policy and HEP activities. Bil urethral mobilization vaginally Superficial pelvic floor muscle release vaginally  Neuromuscular re-education: Core retraining:  Transversus abdominus training with multimodal cues Core facilitation: Supine march 2 x 10 Leg extensions 10x bil Self-care: Pregnancy care - course of treatment - visits moving forward to try and preserve visits towards the end of the year Discussed internal pelvic floor work in first trimester - patient states that is not concerned and would like to continue vaginal manual techniques    TREATMENT 09/04/21 Manual: Myofascial release: External bladder release Internal pelvic floor techniques: No emotional/communication barriers or cognitive limitation. Patient is motivated to learn. Patient understands and agrees with treatment goals and plan. PT explains patient will be examined in standing, sitting, and lying down to see how their muscles and joints work. When they are ready, they will be asked to remove their underwear so PT can examine their perineum. The patient is also given the option of providing their own chaperone as one is not provided in our facility. The patient also has the right and is explained the right to defer or refuse any part of the evaluation or treatment including the internal exam. With the patient's consent, PT will use one gloved finger to gently assess the muscles of the pelvic floor, seeing how well it contracts and relaxes and if there is muscle symmetry.  After, the patient will get dressed and PT and patient will discuss exam findings and plan of care. PT and patient discuss plan of care, schedule, attendance policy and HEP activities. Bil urethral mobilization and internal bladder mobilization Superficial pelvic floor muscle release Deep muscle release Lt>Rt Exercises: Stretches/mobility: Hip flexor stretch kneeling 60 sec bil Hip flexor stretch kneeling with arm elevated 60 sec bil Open books 10x bil Pigeon 10 breaths bil Piriformis stretch 60 sec bil   TREATMENT 08/22/21 Manual: Myofascial release: External bladder release Internal pelvic floor techniques: No emotional/communication barriers or cognitive limitation. Patient is motivated to learn. Patient understands and agrees with treatment goals and plan. PT explains patient will be examined in standing, sitting, and lying down to see how their muscles and joints work. When they are ready, they will be asked to remove their underwear so PT can examine their perineum. The patient is also given the option of providing their own chaperone as one is not provided in our facility. The patient also has the right and is explained the right to defer or refuse any part of the evaluation or treatment including the internal exam. With the patient's consent, PT will use one gloved finger to gently assess the muscles of the pelvic floor, seeing how well it contracts and relaxes and if there is muscle symmetry. After, the patient will get dressed and PT and patient will discuss exam findings and plan of care. PT and patient discuss plan of care, schedule, attendance policy and HEP activities. Bil urethral mobilization and internal bladder mobilization Superficial pelvic floor muscle release Deep muscle release Lt>Rt Dry needling: Neuromuscular re-education: Down training: Diaphragmatic breathing for improved pelvic floor relaxation   PATIENT EDUCATION:  Education details: Hep progressions Person  educated: Patient Education method: Explanation, Demonstration, Tactile cues, Verbal cues, and Handouts Education comprehension: verbalized understanding     HOME EXERCISE PROGRAM: [id number]   ASSESSMENT:  CLINICAL IMPRESSION: Due to early pregnancy, we discussed internal vaginal techniques; patient states that she would like to contiue - no lower abdominal techniques performed. Believe she is making progress with less often experiencing pain with urination. With superficial vaginal release of pelvic floor, there was more notable restriction on the Lt today compared to Rt. Urethral mobility unchanged from last session, but less tender overall. No deep pelvic floor release performed today. Core training started to help improve circulation and mobility; we discussed how these muscles will be even more important going into this pregnancy to help stabilize pelvis. She will benefit from skilled PT intervention in order to decrease pain with bowel movements/urination/intercourse/tampon use, address impairments, and improve QOL.      OBJECTIVE IMPAIRMENTS decreased activity tolerance, decreased coordination, decreased endurance, decreased mobility, decreased ROM, decreased strength, hypomobility, increased fascial restrictions, increased muscle spasms, postural dysfunction, and pain.    ACTIVITY LIMITATIONS  urination/bowel movements, intercourse, tampon use .    PERSONAL FACTORS 1 comorbidity: traumatic vaginal delivery  are also affecting patient's functional outcome.      REHAB POTENTIAL: Good   CLINICAL DECISION MAKING: Stable/uncomplicated   EVALUATION COMPLEXITY: Low     GOALS: Goals reviewed with patient? Yes   SHORT TERM GOALS: Target date: 08/21/2021 - updated 09/11/21   Pt will be independent with HEP.    Baseline: Goal status: MET   2.  Pt will be able to correctly perform diaphragmatic breathing and appropriate pressure management in order to prevent worsening vaginal wall  laxity and improve pelvic floor A/ROM.    Baseline:  Goal status: IN PROGRESS    3.  Pt will be independent with the knack, urge suppression technique, and double voiding in order to improve bladder habits and decrease urinary incontinence.    Baseline:  Goal status: IN PROGRESS      LONG TERM GOALS: Target date: 10/16/2021 - updated 09/11/21   Pt will be independent with advanced HEP.    Baseline:  Goal status: IN PROGRESS    2.  Pt will demonstrate normal pelvic floor muscle tone and A/ROM, able to achieve 4/5 strength with contractions and 10 sec endurance, in order to provide appropriate lumbopelvic support in functional activities.    Baseline:  Goal status: IN PROGRESS    3.  Pt will be able to sit, perform toileting activities, have intercourse, and use tampons without pelvic pain.  Baseline:  Goal status: IN PROGRESS    4.  Pt will report no episodes of urinary incontinence in order to improve confidence in community activities and personal hygiene.    Baseline:  Goal status: IN PROGRESS        PLAN: PT FREQUENCY: 1x/week   PT DURATION: 12 weeks   PLANNED INTERVENTIONS: Therapeutic exercises, Therapeutic activity, Neuromuscular re-education, Balance training, Gait training, Patient/Family education, Joint mobilization, Dry Needling, Biofeedback, and Manual therapy   PLAN FOR NEXT SESSION: Continue manual techniques to tolerance; progress mobility exercises and down training.    Heather Roberts, PT, DPT06/13/2311:04 AM

## 2021-09-12 ENCOUNTER — Telehealth: Payer: Self-pay | Admitting: Emergency Medicine

## 2021-09-12 LAB — SPECIMEN STATUS REPORT

## 2021-09-12 LAB — BETA HCG QUANT (REF LAB): hCG Quant: 27 m[IU]/mL

## 2021-09-12 NOTE — Telephone Encounter (Signed)
Pt called and inquired about meaning of HCG levels. Discussed with pt the need to follow-up with OB. Pt verbalized understanding.

## 2021-09-19 ENCOUNTER — Ambulatory Visit: Payer: Medicaid Other

## 2021-09-19 DIAGNOSIS — M62838 Other muscle spasm: Secondary | ICD-10-CM

## 2021-09-19 DIAGNOSIS — R279 Unspecified lack of coordination: Secondary | ICD-10-CM

## 2021-09-19 DIAGNOSIS — M6281 Muscle weakness (generalized): Secondary | ICD-10-CM

## 2021-09-19 NOTE — Therapy (Signed)
OUTPATIENT PHYSICAL THERAPY TREATMENT NOTE   Patient Name: Hannah Walker MRN: [id number] DOB:01-20-2001, 21 y.o., female Today's Date: 09/19/2021  PCP: None REFERRING PROVIDER: Allyn Kenner, DO  END OF SESSION:   PT End of Session - 09/19/21 1017     Visit Number 5    Date for PT Re-Evaluation 10/16/21    Authorization Type [employer] Medicaid    PT Start Time 3790    PT Stop Time 1055    PT Time Calculation (min) 40 min    Activity Tolerance Patient tolerated treatment well    Behavior During Therapy [facility] for tasks assessed/performed                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   Hyperemesis gravidarum 12/21/2020   PCO (polycystic ovaries)    Renal disorder    kidney stones   Tachycardia    Urine retention    Vaginal Pap smear, abnormal    Past Surgical History:  Procedure Laterality Date   DILATION AND EVACUATION N/A 04/13/2021   Procedure: DILATATION AND EVACUATION;  Surgeon: Rowland Lathe, MD;  Location: [facility] LD [facility];  Service: Gynecology;  Laterality: N/A;   TENDON REPAIR Right 05/15/2018   Procedure: EXTENSOR TENDON REPAIR RIGHT THUMB;  Surgeon: Dayna Barker, MD;  Location: [place];  Service: Plastics;  Laterality: Right;   Patient Active Problem List   Diagnosis Date Noted   [redacted] weeks gestation of pregnancy 04/12/2021   Bilateral posterior neck pain 06/12/2020   Tired 06/03/2019   History of ovarian cyst 06/03/2019   Nexplanon in place 06/03/2019   LLQ pain 06/03/2019   Irregular bleeding 06/03/2019   Pelvic pain 06/03/2019   Episodic tension-type headache, not intractable 01/07/2018   Migraine without aura and without status migrainosus, not intractable 07/23/2017   New daily persistent headache 02/27/2017   Concussion without loss of consciousness 02/27/2017    REFERRING DIAG: R10.2 (ICD-10-CM) - Pelvic and perineal pain  THERAPY DIAG:  Muscle weakness  (generalized)  Other muscle spasm  Unspecified lack of coordination  Rationale for Evaluation and Treatment Rehabilitation  PERTINENT HISTORY: Traumatic vaginal delivery with prolonged period in stirrups and hemorrhage  PRECAUTIONS: NA  SUBJECTIVE: Patient states that she feels like there have continued to be improvements in pain with urination. She reports that the pelvic floor spasms are still really bad and happen 5-6x/day.   PAIN:  Are you having pain? Yes  SUBJECTIVE:                                                                          SUBJECTIVE STATEMENT: Pt delivered son 04/13/21 and had hemorrhage where she was in stirrups for hours on end. After waking up she was in a lot of pain. She has pain with bowel movement and urination. She feels like she is urinating glass when she goes. Bowel movements still hurt in her vaginal area. She is unable to wear tampons due to pain. Unable to have intercourse.  Fluid intake: Yes: She drinks nothing but water and feels like she gets a lot.      Patient confirms  identification and approves PT to assess pelvic floor and treatment Yes     PAIN:  Are you having pain? Yes Pelvic pain with above activities, but when her legs are crossed she will have pain with sitting and be very painful when she stands.       PATIENT GOALS to decrease pain     BOWEL MOVEMENT Pain with bowel movement: Yes Type of bowel movement:Frequency 1-2x/day, no straining Fully empty rectum: Yes: - Leakage: No Pads: No Fiber supplement: No   URINATION Pain with urination: Yes Fully empty bladder: No - post-void dribbling Stream:  intermittent  - has to strain hard to urinate Urgency: Yes: will leak if she does not go immediately Frequency: every 41min to an hour Leakage: Urge to void, Coughing, Sneezing, Laughing, and sit<>stand Pads: Yes: wears liners on regular basis more form discharge   INTERCOURSE Pain with intercourse: Initial Penetration, During  Penetration, and After Intercourse for 15-20 minutes; urinating after intercourse is very painful; they are using lubricant Ability to have vaginal penetration:  No Climax: not able to orgasm since delivery Marinoff Scale: 0/3   PREGNANCY Vaginal deliveries 1 Tearing Yes: 1st degree tear - stitches came out within 3 days C-section deliveries 0 Currently pregnant No     OBJECTIVE:    COGNITION:            Overall cognitive status: Within functional limits for tasks assessed                          SENSATION:            Light touch: Appears intact            Proprioception: Appears intact     GAIT: WNL   POSTURE:  WNL   PELVIC MMT: No active contraction palpated; with cuing and breath coordination, weak 1/5; no endurance          PALPATION:   General  2 finger width diastasis recti superior to umbilicus with mild coning                 External Perineal Exam redness/irritation labia minora                             Internal Pelvic Floor significant tenderness throughout superficial/deep pelvic floor with moderate restriction; tenderness worse Lt>Rt; perineal scar tissue restriction   TONE: Moderate   PROLAPSE: Gr 1 anterior vaginal wall laxity   TODAY'S TREATMENT 09/19/21 Exercises: Stretches/mobility: Seated piriformis stretch 2 x 60 sec bil Supine hamstring 3-way 3 min bil Happy baby 10 breaths Deep stretchy squat with support on door 5x Strengthening: Clam shells 2 x 10 bil Bridge with hip adduction 10x 5 sec hold  TREATMENT 09/11/21 Manual: Internal pelvic floor techniques: No emotional/communication barriers or cognitive limitation. Patient is motivated to learn. Patient understands and agrees with treatment goals and plan. PT explains patient will be examined in standing, sitting, and lying down to see how their muscles and joints work. When they are ready, they will be asked to remove their underwear so PT can examine their perineum. The patient is also  given the option of providing their own chaperone as one is not provided in our facility. The patient also has the right and is explained the right to defer or refuse any part of the evaluation or treatment including the internal exam. With the patient's consent, PT  will use one gloved finger to gently assess the muscles of the pelvic floor, seeing how well it contracts and relaxes and if there is muscle symmetry. After, the patient will get dressed and PT and patient will discuss exam findings and plan of care. PT and patient discuss plan of care, schedule, attendance policy and HEP activities. Bil urethral mobilization vaginally Superficial pelvic floor muscle release vaginally  Neuromuscular re-education: Core retraining:  Transversus abdominus training with multimodal cues Core facilitation: Supine march 2 x 10 Leg extensions 10x bil Self-care: Pregnancy care - course of treatment - visits moving forward to try and preserve visits towards the end of the year Discussed internal pelvic floor work in first trimester - patient states that is not concerned and would like to continue vaginal manual techniques    TREATMENT 09/04/21 Manual: Myofascial release: External bladder release Internal pelvic floor techniques: No emotional/communication barriers or cognitive limitation. Patient is motivated to learn. Patient understands and agrees with treatment goals and plan. PT explains patient will be examined in standing, sitting, and lying down to see how their muscles and joints work. When they are ready, they will be asked to remove their underwear so PT can examine their perineum. The patient is also given the option of providing their own chaperone as one is not provided in our facility. The patient also has the right and is explained the right to defer or refuse any part of the evaluation or treatment including the internal exam. With the patient's consent, PT will use one gloved finger to gently  assess the muscles of the pelvic floor, seeing how well it contracts and relaxes and if there is muscle symmetry. After, the patient will get dressed and PT and patient will discuss exam findings and plan of care. PT and patient discuss plan of care, schedule, attendance policy and HEP activities. Bil urethral mobilization and internal bladder mobilization Superficial pelvic floor muscle release Deep muscle release Lt>Rt Exercises: Stretches/mobility: Hip flexor stretch kneeling 60 sec bil Hip flexor stretch kneeling with arm elevated 60 sec bil Open books 10x bil Pigeon 10 breaths bil Piriformis stretch 60 sec bil    PATIENT EDUCATION:  Education details: Hep progressions Person educated: Patient Education method: Explanation, Demonstration, Tactile cues, Verbal cues, and Handouts Education comprehension: verbalized understanding     HOME EXERCISE PROGRAM: [id number]   ASSESSMENT:   CLINICAL IMPRESSION: Patient is seeing improvement with pain while urinating, but she feels like pelvic floor spasms are getting worse. Today, no manual techniques performed due to focus on mobility and strengthening exercises to see if that helps stabilize pelvis, reducing pelvic floor spasm. Patient tolerated all mobility exercises and gentle strengthening very well with no increase in pain. HEP updated to include hip strengthening progressions. We discussed most beneficial exercises to perform when she is having pelvic floor spasms. She will benefit from skilled PT intervention in order to decrease pain with bowel movements/urination/intercourse/tampon use, address impairments, and improve QOL.      OBJECTIVE IMPAIRMENTS decreased activity tolerance, decreased coordination, decreased endurance, decreased mobility, decreased ROM, decreased strength, hypomobility, increased fascial restrictions, increased muscle spasms, postural dysfunction, and pain.    ACTIVITY LIMITATIONS  urination/bowel movements,  intercourse, tampon use .    PERSONAL FACTORS 1 comorbidity: traumatic vaginal delivery  are also affecting patient's functional outcome.      REHAB POTENTIAL: Good   CLINICAL DECISION MAKING: Stable/uncomplicated   EVALUATION COMPLEXITY: Low     GOALS: Goals reviewed with patient? Yes  SHORT TERM GOALS: Target date: 08/21/2021 - updated 09/11/21   Pt will be independent with HEP.    Baseline: Goal status: MET   2.  Pt will be able to correctly perform diaphragmatic breathing and appropriate pressure management in order to prevent worsening vaginal wall laxity and improve pelvic floor A/ROM.    Baseline:  Goal status: IN PROGRESS    3.  Pt will be independent with the knack, urge suppression technique, and double voiding in order to improve bladder habits and decrease urinary incontinence.    Baseline:  Goal status: IN PROGRESS      LONG TERM GOALS: Target date: 10/16/2021 - updated 09/11/21   Pt will be independent with advanced HEP.    Baseline:  Goal status: IN PROGRESS    2.  Pt will demonstrate normal pelvic floor muscle tone and A/ROM, able to achieve 4/5 strength with contractions and 10 sec endurance, in order to provide appropriate lumbopelvic support in functional activities.    Baseline:  Goal status: IN PROGRESS    3.  Pt will be able to sit, perform toileting activities, have intercourse, and use tampons without pelvic pain.  Baseline:  Goal status: IN PROGRESS    4.  Pt will report no episodes of urinary incontinence in order to improve confidence in community activities and personal hygiene.    Baseline:  Goal status: IN PROGRESS        PLAN: PT FREQUENCY: 1x/week   PT DURATION: 12 weeks   PLANNED INTERVENTIONS: Therapeutic exercises, Therapeutic activity, Neuromuscular re-education, Balance training, Gait training, Patient/Family education, Joint mobilization, Dry Needling, Biofeedback, and Manual therapy   PLAN FOR NEXT SESSION: Continue  manual techniques to tolerance; progress mobility exercises and down training.    Heather Roberts, PT, DPT06/21/2310:54 AM

## 2021-09-25 ENCOUNTER — Ambulatory Visit: Payer: Medicaid Other

## 2021-09-25 DIAGNOSIS — R279 Unspecified lack of coordination: Secondary | ICD-10-CM

## 2021-09-25 DIAGNOSIS — M62838 Other muscle spasm: Secondary | ICD-10-CM

## 2021-09-25 DIAGNOSIS — M6281 Muscle weakness (generalized): Secondary | ICD-10-CM | POA: Diagnosis not present

## 2021-10-01 ENCOUNTER — Telehealth: Payer: Self-pay

## 2021-10-01 ENCOUNTER — Ambulatory Visit: Payer: Medicaid Other | Attending: Obstetrics and Gynecology

## 2021-10-01 DIAGNOSIS — M62838 Other muscle spasm: Secondary | ICD-10-CM | POA: Insufficient documentation

## 2021-10-01 DIAGNOSIS — R279 Unspecified lack of coordination: Secondary | ICD-10-CM | POA: Insufficient documentation

## 2021-10-01 DIAGNOSIS — M6281 Muscle weakness (generalized): Secondary | ICD-10-CM | POA: Insufficient documentation

## 2021-10-01 NOTE — Telephone Encounter (Signed)
Called and spoke with patient after no-show on 10/01/21 at 10:15am. She states she forgot about appointment. We will try and get her in if there are any openings later this week.

## 2021-10-09 ENCOUNTER — Ambulatory Visit: Payer: Medicaid Other

## 2021-10-09 ENCOUNTER — Telehealth (INDEPENDENT_AMBULATORY_CARE_PROVIDER_SITE_OTHER): Payer: Medicaid Other

## 2021-10-09 DIAGNOSIS — R279 Unspecified lack of coordination: Secondary | ICD-10-CM

## 2021-10-09 DIAGNOSIS — M62838 Other muscle spasm: Secondary | ICD-10-CM | POA: Diagnosis present

## 2021-10-09 DIAGNOSIS — M6281 Muscle weakness (generalized): Secondary | ICD-10-CM | POA: Diagnosis not present

## 2021-10-09 DIAGNOSIS — Z3490 Encounter for supervision of normal pregnancy, unspecified, unspecified trimester: Secondary | ICD-10-CM

## 2021-10-09 NOTE — Therapy (Signed)
OUTPATIENT PHYSICAL THERAPY TREATMENT NOTE   Patient Name: Hannah Walker MRN: [id number] DOB:January 06, 2001, 21 y.o., female Today's Date: 10/09/2021  PCP: None REFERRING PROVIDER: Allyn Kenner, DO  END OF SESSION:   PT End of Session - 10/09/21 1231     Visit Number 7    Date for PT Re-Evaluation 10/16/21    Authorization Type [employer] Medicaid    PT Start Time 1230    PT Stop Time 1310    PT Time Calculation (min) 40 min    Activity Tolerance Patient tolerated treatment well    Behavior During Therapy [facility] for tasks assessed/performed                  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   Hyperemesis gravidarum 12/21/2020   PCO (polycystic ovaries)    Renal disorder    kidney stones   Tachycardia    Urine retention    Vaginal Pap smear, abnormal    Past Surgical History:  Procedure Laterality Date   DILATION AND EVACUATION N/A 04/13/2021   Procedure: DILATATION AND EVACUATION;  Surgeon: Rowland Lathe, MD;  Location: [facility] LD [facility];  Service: Gynecology;  Laterality: N/A;   TENDON REPAIR Right 05/15/2018   Procedure: EXTENSOR TENDON REPAIR RIGHT THUMB;  Surgeon: Dayna Barker, MD;  Location: [place];  Service: Plastics;  Laterality: Right;   Patient Active Problem List   Diagnosis Date Noted   [redacted] weeks gestation of pregnancy 04/12/2021   Bilateral posterior neck pain 06/12/2020   Tired 06/03/2019   History of ovarian cyst 06/03/2019   Nexplanon in place 06/03/2019   LLQ pain 06/03/2019   Irregular bleeding 06/03/2019   Pelvic pain 06/03/2019   Episodic tension-type headache, not intractable 01/07/2018   Migraine without aura and without status migrainosus, not intractable 07/23/2017   New daily persistent headache 02/27/2017   Concussion without loss of consciousness 02/27/2017    REFERRING DIAG: R10.2 (ICD-10-CM) - Pelvic and perineal pain  THERAPY DIAG:  Muscle weakness  (generalized)  Other muscle spasm  Unspecified lack of coordination  Rationale for Evaluation and Treatment Rehabilitation  PERTINENT HISTORY: Traumatic vaginal delivery with prolonged period in stirrups and hemorrhage  PRECAUTIONS: NA  SUBJECTIVE: Pt had follow up US yesterday and has due date of 05/23/22. MD is recommending that she stick with pelvic floor PT throughout pregnancy. She states that pain has been bad and she is having a lot of spasms. Pain increased when she is trying to use the bathroom. Stretches are still helpful when she does get the spasms to reduce pain. She does consent to internal pelvic floor treatment in her first trimester.   PAIN:  Are you having pain? No  SUBJECTIVE:                                                                          SUBJECTIVE STATEMENT: Pt delivered son 04/13/21 and had hemorrhage where she was in stirrups for hours on end. After waking up she was in a lot of pain. She has pain with bowel movement and urination. She feels like she is urinating glass when  she goes. Bowel movements still hurt in her vaginal area. She is unable to wear tampons due to pain. Unable to have intercourse.  Fluid intake: Yes: She drinks nothing but water and feels like she gets a lot.      Patient confirms identification and approves PT to assess pelvic floor and treatment Yes     PAIN:  Are you having pain? Yes Pelvic pain with above activities, but when her legs are crossed she will have pain with sitting and be very painful when she stands.       PATIENT GOALS to decrease pain     BOWEL MOVEMENT Pain with bowel movement: Yes Type of bowel movement:Frequency 1-2x/day, no straining Fully empty rectum: Yes: - Leakage: No Pads: No Fiber supplement: No   URINATION Pain with urination: Yes Fully empty bladder: No - post-void dribbling Stream:  intermittent  - has to strain hard to urinate Urgency: Yes: will leak if she does not go  immediately Frequency: every 65min to an hour Leakage: Urge to void, Coughing, Sneezing, Laughing, and sit<>stand Pads: Yes: wears liners on regular basis more form discharge   INTERCOURSE Pain with intercourse: Initial Penetration, During Penetration, and After Intercourse for 15-20 minutes; urinating after intercourse is very painful; they are using lubricant Ability to have vaginal penetration:  No Climax: not able to orgasm since delivery Marinoff Scale: 0/3   PREGNANCY Vaginal deliveries 1 Tearing Yes: 1st degree tear - stitches came out within 3 days C-section deliveries 0 Currently pregnant No     OBJECTIVE:    COGNITION:            Overall cognitive status: Within functional limits for tasks assessed                          SENSATION:            Light touch: Appears intact            Proprioception: Appears intact     GAIT: WNL   POSTURE:  WNL   PELVIC MMT: No active contraction palpated; with cuing and breath coordination, weak 1/5; no endurance          PALPATION:   General  2 finger width diastasis recti superior to umbilicus with mild coning                 External Perineal Exam redness/irritation labia minora                             Internal Pelvic Floor significant tenderness throughout superficial/deep pelvic floor with moderate restriction; tenderness worse Lt>Rt; perineal scar tissue restriction   TONE: Moderate   PROLAPSE: Gr 1 anterior vaginal wall laxity   TODAY TREATMENT 10/09/21 Manual: Internal pelvic floor techniques: No emotional/communication barriers or cognitive limitation. Patient is motivated to learn. Patient understands and agrees with treatment goals and plan. PT explains patient will be examined in standing, sitting, and lying down to see how their muscles and joints work. When they are ready, they will be asked to remove their underwear so PT can examine their perineum. The patient is also given the option of providing their  own chaperone as one is not provided in our facility. The patient also has the right and is explained the right to defer or refuse any part of the evaluation or treatment including the internal exam. With the patient's consent, PT  will use one gloved finger to gently assess the muscles of the pelvic floor, seeing how well it contracts and relaxes and if there is muscle symmetry. After, the patient will get dressed and PT and patient will discuss exam findings and plan of care. PT and patient discuss plan of care, schedule, attendance policy and HEP activities. Bil urethral mobilization vaginally Superficial pelvic floor muscle release vaginally Exercises: Stretches/mobility: Cat cow 2 x 10 Cat cow circles 2 x 10 Child's pose flossing, with and without block under unilateral knee    TREATMENT 09/25/21 Manual: Internal pelvic floor techniques: No emotional/communication barriers or cognitive limitation. Patient is motivated to learn. Patient understands and agrees with treatment goals and plan. PT explains patient will be examined in standing, sitting, and lying down to see how their muscles and joints work. When they are ready, they will be asked to remove their underwear so PT can examine their perineum. The patient is also given the option of providing their own chaperone as one is not provided in our facility. The patient also has the right and is explained the right to defer or refuse any part of the evaluation or treatment including the internal exam. With the patient's consent, PT will use one gloved finger to gently assess the muscles of the pelvic floor, seeing how well it contracts and relaxes and if there is muscle symmetry. After, the patient will get dressed and PT and patient will discuss exam findings and plan of care. PT and patient discuss plan of care, schedule, attendance policy and HEP activities. Bil urethral mobilization vaginally Superficial pelvic floor muscle release  vaginally Exercises: Stretches/mobility: Frog stretch/rocking 2 x 10 Adductor rocking with windmill arms 10x bil Wide stance thoracic openers 10x Self-care: Vulvovaginal massage - regular Discussed impact of hormones of pelvic floor during pregnancy that may be impacting vulvar/vaginal tissues Foreplay/increasing time for arousal before intimate activities   TREATMENT 09/19/21 Exercises: Stretches/mobility: Seated piriformis stretch 2 x 60 sec bil Supine hamstring 3-way 3 min bil Happy baby 10 breaths Deep stretchy squat with support on door 5x Strengthening: Clam shells 2 x 10 bil Bridge with hip adduction 10x 5 sec hold   PATIENT EDUCATION:  Education details: Hep progressions Person educated: Patient Education method: Consulting civil engineer, Demonstration, Tactile cues, Verbal cues, and Handouts Education comprehension: verbalized understanding     HOME EXERCISE PROGRAM: [id number]   ASSESSMENT:   CLINICAL IMPRESSION: Patient demonstrated large increase in pelvic floor tension today; vaginal pelvic floor release performed to 1st/2nd pelvic floor layers, mostly posteriorly due to focused tension there. She had increased dryness vaginally along with increased pain. She did not really have any improvement in pain with pelvic floor release and diaphragmatic breathing techniques. Need to consider talking with MD about topical options for superficial pelvic floor to help improve dryness and promote scar tissue healing. She tolerated additional mobility exercises well with no increase in pain; reviewed cat/cow due to incorrect performance - this will allow her to achieve better mobility and pelvic floor relaxation.She will continue to benefit from skilled PT intervention in order to decrease pain with bowel movements/urination/intercourse/tampon use, address impairments, and improve QOL.      OBJECTIVE IMPAIRMENTS decreased activity tolerance, decreased coordination, decreased endurance,  decreased mobility, decreased ROM, decreased strength, hypomobility, increased fascial restrictions, increased muscle spasms, postural dysfunction, and pain.    ACTIVITY LIMITATIONS  urination/bowel movements, intercourse, tampon use .    PERSONAL FACTORS 1 comorbidity: traumatic vaginal delivery  are also affecting patient's functional outcome.  REHAB POTENTIAL: Good   CLINICAL DECISION MAKING: Stable/uncomplicated   EVALUATION COMPLEXITY: Low     GOALS: Goals reviewed with patient? Yes   SHORT TERM GOALS: Target date: 08/21/2021 - updated 09/11/21   Pt will be independent with HEP.    Baseline: Goal status: MET   2.  Pt will be able to correctly perform diaphragmatic breathing and appropriate pressure management in order to prevent worsening vaginal wall laxity and improve pelvic floor A/ROM.    Baseline:  Goal status: IN PROGRESS    3.  Pt will be independent with the knack, urge suppression technique, and double voiding in order to improve bladder habits and decrease urinary incontinence.    Baseline:  Goal status: IN PROGRESS      LONG TERM GOALS: Target date: 10/16/2021 - updated 09/11/21   Pt will be independent with advanced HEP.    Baseline:  Goal status: IN PROGRESS    2.  Pt will demonstrate normal pelvic floor muscle tone and A/ROM, able to achieve 4/5 strength with contractions and 10 sec endurance, in order to provide appropriate lumbopelvic support in functional activities.    Baseline:  Goal status: IN PROGRESS    3.  Pt will be able to sit, perform toileting activities, have intercourse, and use tampons without pelvic pain.  Baseline:  Goal status: IN PROGRESS    4.  Pt will report no episodes of urinary incontinence in order to improve confidence in community activities and personal hygiene.    Baseline:  Goal status: IN PROGRESS        PLAN: PT FREQUENCY: 1x/week   PT DURATION: 12 weeks   PLANNED INTERVENTIONS: Therapeutic exercises,  Therapeutic activity, Neuromuscular re-education, Balance training, Gait training, Patient/Family education, Joint mobilization, Dry Needling, Biofeedback, and Manual therapy   PLAN FOR NEXT SESSION: Continue manual techniques to tolerance; progress mobility exercises and down training.    Heather Roberts, PT, DPT07/11/231:18 PM

## 2021-10-09 NOTE — Progress Notes (Signed)
Called pt at 1018 to begin video visit; VM left stating purpose of call and callback number given. Direct link to visit sent via text and email. Called pt at 1034 for second attempt; VM left stating patient will need to reschedule appt. MyChart message sent. Per chart review patient has scheduled appt at [facility] on 10/12/21.  [name] RN 10/09/21

## 2021-10-16 ENCOUNTER — Ambulatory Visit: Payer: Medicaid Other

## 2021-10-16 DIAGNOSIS — M6281 Muscle weakness (generalized): Secondary | ICD-10-CM | POA: Diagnosis not present

## 2021-10-16 DIAGNOSIS — M62838 Other muscle spasm: Secondary | ICD-10-CM

## 2021-10-16 DIAGNOSIS — R279 Unspecified lack of coordination: Secondary | ICD-10-CM

## 2021-10-16 NOTE — Therapy (Addendum)
OUTPATIENT PHYSICAL THERAPY TREATMENT NOTE   Patient Name: Hannah Walker MRN: [id number] DOB:June 22, 2000, 21 y.o., female Today's Date: 10/16/2021  PCP: None REFERRING PROVIDER: Allyn Kenner, DO  END OF SESSION:   PT End of Session - 10/16/21 1233     Visit Number 8    Date for PT Re-Evaluation 11/13/21    Authorization Type [employer]    Authorization - Visit Number 8    Authorization - Number of Visits 12    PT Start Time 1230    PT Stop Time 1310    PT Time Calculation (min) 40 min    Activity Tolerance Patient tolerated treatment well    Behavior During Therapy [facility] for tasks assessed/performed                   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   Hyperemesis gravidarum 12/21/2020   PCO (polycystic ovaries)    Renal disorder    kidney stones   Tachycardia    Urine retention    Vaginal Pap smear, abnormal    Past Surgical History:  Procedure Laterality Date   DILATION AND EVACUATION N/A 04/13/2021   Procedure: DILATATION AND EVACUATION;  Surgeon: Rowland Lathe, MD;  Location: [facility] LD ORS;  Service: Gynecology;  Laterality: N/A;   TENDON REPAIR Right 05/15/2018   Procedure: EXTENSOR TENDON REPAIR RIGHT THUMB;  Surgeon: Dayna Barker, MD;  Location: [place];  Service: Plastics;  Laterality: Right;   Patient Active Problem List   Diagnosis Date Noted   [redacted] weeks gestation of pregnancy 04/12/2021   Bilateral posterior neck pain 06/12/2020   Tired 06/03/2019   History of ovarian cyst 06/03/2019   Nexplanon in place 06/03/2019   LLQ pain 06/03/2019   Irregular bleeding 06/03/2019   Pelvic pain 06/03/2019   Episodic tension-type headache, not intractable 01/07/2018   Migraine without aura and without status migrainosus, not intractable 07/23/2017   New daily persistent headache 02/27/2017   Concussion without loss of consciousness 02/27/2017    REFERRING DIAG: R10.2  (ICD-10-CM) - Pelvic and perineal pain  THERAPY DIAG:  Muscle weakness (generalized)  Other muscle spasm  Unspecified lack of coordination  Rationale for Evaluation and Treatment Rehabilitation  PERTINENT HISTORY: Traumatic vaginal delivery with prolonged period in stirrups and hemorrhage  PRECAUTIONS: NA  SUBJECTIVE: Pt states that pelvic floor spasms are becoming very severe during intercourse. She states that there are no positions that are comfortable any more. She is noticing large improvement in spasms/pain with intercourse. She feels like resting pain while seated is getting better - can sit longer and more frequently without pain. She is still working on [employer] regularly. She currently reports 0/10 pain. She was sore after last treatment session, but it was gone by the next day. Pt states that urination feels 20-30% better.   PAIN:  Are you having pain? No  SUBJECTIVE:                                                                          SUBJECTIVE STATEMENT: Pt delivered son 04/13/21 and had hemorrhage where she was in stirrups  for hours on end. After waking up she was in a lot of pain. She has pain with bowel movement and urination. She feels like she is urinating glass when she goes. Bowel movements still hurt in her vaginal area. She is unable to wear tampons due to pain. Unable to have intercourse.  Fluid intake: Yes: She drinks nothing but water and feels like she gets a lot.      Patient confirms identification and approves PT to assess pelvic floor and treatment Yes     PAIN:  Are you having pain? Yes Pelvic pain with above activities, but when her legs are crossed she will have pain with sitting and be very painful when she stands.       PATIENT GOALS to decrease pain     BOWEL MOVEMENT Pain with bowel movement: Yes Type of bowel movement:Frequency 1-2x/day, no straining Fully empty rectum: Yes: - Leakage: No Pads: No Fiber supplement: No   URINATION Pain  with urination: Yes Fully empty bladder: No - post-void dribbling Stream:  intermittent  - has to strain hard to urinate Urgency: Yes: will leak if she does not go immediately Frequency: every 75mn to an hour Leakage: Urge to void, Coughing, Sneezing, Laughing, and sit<>stand Pads: Yes: wears liners on regular basis more form discharge   INTERCOURSE Pain with intercourse: Initial Penetration, During Penetration, and After Intercourse for 15-20 minutes; urinating after intercourse is very painful; they are using lubricant Ability to have vaginal penetration:  No Climax: not able to orgasm since delivery Marinoff Scale: 0/3   PREGNANCY Vaginal deliveries 1 Tearing Yes: 1st degree tear - stitches came out within 3 days C-section deliveries 0 Currently pregnant No     OBJECTIVE 10/16/21: burning/stinging surrounding introitus; high tone in superficial/deep pelvic floor muscles; pelvic floor contraction 1/5 with poor motor control    07/24/21: COGNITION:            Overall cognitive status: Within functional limits for tasks assessed                          SENSATION:            Light touch: Appears intact            Proprioception: Appears intact     GAIT: WNL   POSTURE:  WNL   PELVIC MMT: No active contraction palpated; with cuing and breath coordination, weak 1/5; no endurance          PALPATION:   General  2 finger width diastasis recti superior to umbilicus with mild coning                 External Perineal Exam redness/irritation labia minora                             Internal Pelvic Floor significant tenderness throughout superficial/deep pelvic floor with moderate restriction; tenderness worse Lt>Rt; perineal scar tissue restriction   TONE: Moderate   PROLAPSE: Gr 1 anterior vaginal wall laxity   TODAY TREATMENT 10/16/21 Manual: Internal pelvic floor techniques: No emotional/communication barriers or cognitive limitation. Patient is motivated to learn.  Patient understands and agrees with treatment goals and plan. PT explains patient will be examined in standing, sitting, and lying down to see how their muscles and joints work. When they are ready, they will be asked to remove their underwear so PT can examine their perineum. The  patient is also given the option of providing their own chaperone as one is not provided in our facility. The patient also has the right and is explained the right to defer or refuse any part of the evaluation or treatment including the internal exam. With the patient's consent, PT will use one gloved finger to gently assess the muscles of the pelvic floor, seeing how well it contracts and relaxes and if there is muscle symmetry. After, the patient will get dressed and PT and patient will discuss exam findings and plan of care. PT and patient discuss plan of care, schedule, attendance policy and HEP activities. Superficial pelvic floor muscle release vaginally Neuromuscular re-education: Pelvic floor contraction training: Pelvic floor contraction training to help improve circulation and proprioception of relaxation Self-care: The knack Urge suppression technique Double voiding  TREATMENT 10/09/21 Manual: Internal pelvic floor techniques: No emotional/communication barriers or cognitive limitation. Patient is motivated to learn. Patient understands and agrees with treatment goals and plan. PT explains patient will be examined in standing, sitting, and lying down to see how their muscles and joints work. When they are ready, they will be asked to remove their underwear so PT can examine their perineum. The patient is also given the option of providing their own chaperone as one is not provided in our facility. The patient also has the right and is explained the right to defer or refuse any part of the evaluation or treatment including the internal exam. With the patient's consent, PT will use one gloved finger to gently assess  the muscles of the pelvic floor, seeing how well it contracts and relaxes and if there is muscle symmetry. After, the patient will get dressed and PT and patient will discuss exam findings and plan of care. PT and patient discuss plan of care, schedule, attendance policy and HEP activities. Bil urethral mobilization vaginally Superficial pelvic floor muscle release vaginally Exercises: Stretches/mobility: Cat cow 2 x 10 Cat cow circles 2 x 10 Child's pose flossing, with and without block under unilateral knee    TREATMENT 09/25/21 Manual: Internal pelvic floor techniques: No emotional/communication barriers or cognitive limitation. Patient is motivated to learn. Patient understands and agrees with treatment goals and plan. PT explains patient will be examined in standing, sitting, and lying down to see how their muscles and joints work. When they are ready, they will be asked to remove their underwear so PT can examine their perineum. The patient is also given the option of providing their own chaperone as one is not provided in our facility. The patient also has the right and is explained the right to defer or refuse any part of the evaluation or treatment including the internal exam. With the patient's consent, PT will use one gloved finger to gently assess the muscles of the pelvic floor, seeing how well it contracts and relaxes and if there is muscle symmetry. After, the patient will get dressed and PT and patient will discuss exam findings and plan of care. PT and patient discuss plan of care, schedule, attendance policy and HEP activities. Bil urethral mobilization vaginally Superficial pelvic floor muscle release vaginally Exercises: Stretches/mobility: Frog stretch/rocking 2 x 10 Adductor rocking with windmill arms 10x bil Wide stance thoracic openers 10x Self-care: Vulvovaginal massage - regular Discussed impact of hormones of pelvic floor during pregnancy that may be impacting  vulvar/vaginal tissues Foreplay/increasing time for arousal before intimate activities     PATIENT EDUCATION:  Education details: Hep progressions Person educated: Patient Education method: Explanation,  Demonstration, Tactile cues, Verbal cues, and Handouts Education comprehension: verbalized understanding     HOME EXERCISE PROGRAM: [id number]   ASSESSMENT:   CLINICAL IMPRESSION: Pt has made progress thus far in pelvic floor physical therapy demonstrated by decrease in pain with urination/bowel movements and overall reduction of pelvic floor spasm. However, she has not seen as much improvement in urinary incontinence/urgency and dyspareunia is getting worse - most likely exacerbated by new pregnancy. She does continue to present with high pelvic floor tone, difficulty with pelvic floor contraction coordination, and pain upon palpation. She was introduced to the knack, urge suppression technique, double voiding, and gentle pelvic floor contractions for improved motor control/circulation today with good response to all. HEP updated with these items. We discussed what she would like to do when this PT is gone on maternity leave - she expressed interest in taking a break from PT and focusing on all the tools she has learned at that time, especially since we need to be mindful of how many visits she has for the year. She will continue to benefit from skilled PT intervention in order to decrease pain with bowel movements/urination/intercourse/tampon use, address impairments, and improve QOL.      OBJECTIVE IMPAIRMENTS decreased activity tolerance, decreased coordination, decreased endurance, decreased mobility, decreased ROM, decreased strength, hypomobility, increased fascial restrictions, increased muscle spasms, postural dysfunction, and pain.    ACTIVITY LIMITATIONS  urination/bowel movements, intercourse, tampon use .    PERSONAL FACTORS 1 comorbidity: traumatic vaginal delivery  are also  affecting patient's functional outcome.      REHAB POTENTIAL: Good   CLINICAL DECISION MAKING: Stable/uncomplicated   EVALUATION COMPLEXITY: Low     GOALS: Goals reviewed with patient? Yes   SHORT TERM GOALS: Target date: 08/21/2021 - updated 10/16/21   Pt will be independent with HEP.    Baseline: Goal status: MET   2.  Pt will be able to correctly perform diaphragmatic breathing and appropriate pressure management in order to prevent worsening vaginal wall laxity and improve pelvic floor A/ROM.    Baseline:  Goal status: MET 10/16/21    3.  Pt will be independent with the knack, urge suppression technique, and double voiding in order to improve bladder habits and decrease urinary incontinence.    Baseline: wearing panty liners much less frequently Goal status: IN PROGRESS      LONG TERM GOALS: Target date: 10/16/2021 - updated 10/16/21   Pt will be independent with advanced HEP.    Baseline:  Goal status: IN PROGRESS    2.  Pt will demonstrate normal pelvic floor muscle tone and A/ROM, able to achieve 4/5 strength with contractions and 10 sec endurance, in order to provide appropriate lumbopelvic support in functional activities.    Baseline: improving ability to perform relaxation Goal status: IN PROGRESS    3.  Pt will be able to sit, perform toileting activities, have intercourse, and use tampons without pelvic pain.  Baseline: intercourse causing pelvic floor spasms Goal status: IN PROGRESS    4.  Pt will report no episodes of urinary incontinence in order to improve confidence in community activities and personal hygiene.    Baseline: incontinence improving with only wearing liners Goal status: IN PROGRESS        PLAN: PT FREQUENCY: 1x/week   PT DURATION: 4 weeks   PLANNED INTERVENTIONS: Therapeutic exercises, Therapeutic activity, Neuromuscular re-education, Balance training, Gait training, Patient/Family education, Joint mobilization, Dry Needling,  Biofeedback, and Manual therapy   PLAN FOR NEXT SESSION:  Continue manual techniques to tolerance; progress mobility exercises and down training.    Heather Roberts, PT, DPT07/18/232:44 PM   PHYSICAL THERAPY DISCHARGE SUMMARY  Visits from Start of Care: 8  Current functional level related to goals / functional outcomes: Incomplete   Remaining deficits: See above   Education / Equipment: HEP   Patient agrees to discharge. Patient goals were partially met. Patient is being discharged due to not returning since the last visit.  Heather Roberts, PT, DPT12/26/2310:05 AM

## 2021-10-16 NOTE — Patient Instructions (Signed)
The knack: Use this technique while coughing, laughing, sneezing, or with any activities that causes you to leak urine a little. Right before you perform one of these activities that increase pressure in the abdomen and pushes a little urine out, perform a pelvic floor muscle contraction and hold. If that does not completely stop the leaking, try tightening your thighs together in addition to performing a pelvic floor muscle contraction. Make sure you are not trying to stifle a cough, sneeze, or laugh; allow these activities in full as it will cause less pressure down into the bladder and pelvic floor muscles.   Urge suppression technique: A technique to help you hold urine until it's an appropriate time to go, whether this is making it home or trying to reach a specific voiding time frame according to your schedule. It helps to send signals from the bladder to the brain that say you don't actually have to void urine right now. This most likely only give you several minutes of relief at first, but repeat as needed; the benefit will last longer as you use this technique more and get into better bladder habits. ?  The technique: o Perform 5 quick flicks (Kegels) rapidly, not worrying about fully relaxing in between each (only in this technique). o Then perform several deep belly breaths while focusing on relaxing the pelvic floor. o Go do something else to help distract yourself from the urge to urinate. o Repeat as needed.  Double-voiding:  This technique is to help with post-void dribbling, or leaking a little bit when you stand up right after urinating.  Use relaxed toileting mechanics to urinate as much as you feel like you have to without straining.  Sit back upright from leaning forward and relax this way for 10-20 seconds.  Lean forward again to finish voiding any amount more.     [facility] [address] [place], [place]  [address] Phone # [phone] Fax [phone]

## 2021-10-17 ENCOUNTER — Other Ambulatory Visit: Payer: Self-pay

## 2021-10-17 ENCOUNTER — Emergency Department (HOSPITAL_COMMUNITY)
Admission: EM | Admit: 2021-10-17 | Discharge: 2021-10-18 | Disposition: A | Discharge status: Home / Self Care | Payer: Medicaid Other | Attending: Emergency Medicine | Admitting: Emergency Medicine

## 2021-10-17 ENCOUNTER — Encounter (HOSPITAL_COMMUNITY): Payer: Self-pay | Admitting: Emergency Medicine

## 2021-10-17 DIAGNOSIS — R102 Pelvic and perineal pain: Secondary | ICD-10-CM | POA: Insufficient documentation

## 2021-10-17 DIAGNOSIS — R1011 Right upper quadrant pain: Secondary | ICD-10-CM | POA: Insufficient documentation

## 2021-10-17 DIAGNOSIS — Z3A08 8 weeks gestation of pregnancy: Secondary | ICD-10-CM | POA: Diagnosis not present

## 2021-10-17 DIAGNOSIS — O26891 Other specified pregnancy related conditions, first trimester: Secondary | ICD-10-CM | POA: Diagnosis present

## 2021-10-17 DIAGNOSIS — K219 Gastro-esophageal reflux disease without esophagitis: Secondary | ICD-10-CM | POA: Diagnosis not present

## 2021-10-17 DIAGNOSIS — R1032 Left lower quadrant pain: Secondary | ICD-10-CM | POA: Insufficient documentation

## 2021-10-17 LAB — URINALYSIS, ROUTINE W REFLEX MICROSCOPIC
Bilirubin Urine: NEGATIVE
Glucose, UA: NEGATIVE mg/dL
Hgb urine dipstick: NEGATIVE
Ketones, ur: NEGATIVE mg/dL
Leukocytes,Ua: NEGATIVE
Nitrite: NEGATIVE
Protein, ur: NEGATIVE mg/dL
Specific Gravity, Urine: 1.026 (ref 1.005–1.030)
pH: 5 (ref 5.0–8.0)

## 2021-10-17 LAB — COMPREHENSIVE METABOLIC PANEL
ALT: 14 U/L (ref 0–44)
AST: 13 U/L — ABNORMAL LOW (ref 15–41)
Albumin: 3.9 g/dL (ref 3.5–5.0)
Alkaline Phosphatase: 62 U/L (ref 38–126)
Anion gap: 5 (ref 5–15)
BUN: 10 mg/dL (ref 6–20)
CO2: 24 mmol/L (ref 22–32)
Calcium: 10.3 mg/dL (ref 8.9–10.3)
Chloride: 107 mmol/L (ref 98–111)
Creatinine, Ser: 0.53 mg/dL (ref 0.44–1.00)
GFR, Estimated: >60 mL/min (ref >60)
Glucose, Bld: 97 mg/dL (ref 70–99)
Potassium: 4.1 mmol/L (ref 3.5–5.1)
Sodium: 136 mmol/L (ref 135–145)
Total Bilirubin: 0.3 mg/dL (ref 0.3–1.2)
Total Protein: 7.3 g/dL (ref 6.5–8.1)

## 2021-10-17 LAB — CBC
HCT: 35.6 % — ABNORMAL LOW (ref 36.0–46.0)
Hemoglobin: 11.5 g/dL — ABNORMAL LOW (ref 12.0–15.0)
MCH: 26.6 pg (ref 26.0–34.0)
MCHC: 32.3 g/dL (ref 30.0–36.0)
MCV: 82.2 fL (ref 80.0–100.0)
Platelets: 293 10*3/uL (ref 150–400)
RBC: 4.33 MIL/uL (ref 3.87–5.11)
RDW: 17.3 % — ABNORMAL HIGH (ref 11.5–15.5)
WBC: 9.2 10*3/uL (ref 4.0–10.5)
nRBC: 0 % (ref 0.0–0.2)

## 2021-10-17 LAB — HCG, QUANTITATIVE, PREGNANCY: hCG, Beta Chain, Quant, S: 154731 m[IU]/mL — ABNORMAL HIGH (ref <5)

## 2021-10-17 LAB — POC URINE PREG, ED: Preg Test, Ur: POSITIVE — AB

## 2021-10-17 LAB — LIPASE, BLOOD: Lipase: 25 U/L (ref 11–51)

## 2021-10-17 MED ORDER — ACETAMINOPHEN 500 MG PO TABS
1000.0000 mg | ORAL_TABLET | Freq: Once | ORAL | Status: AC
  Administered 2021-10-17: 1000 mg via ORAL
  Filled 2021-10-17: qty 2

## 2021-10-17 NOTE — ED Triage Notes (Signed)
Pt c/o abd pain that radiates to back. Pt states she is [redacted] weeks pregnant and denies any vaginal discharge/bleeding.

## 2021-10-17 NOTE — ED Provider Notes (Addendum)
[facility] EMERGENCY DEPARTMENT Provider Note   CSN: [id number] Arrival date & time: 10/17/21  2112     History  Chief Complaint  Patient presents with   Abdominal Pain    Hannah Walker is a 21 y.o. female.  Patient presents to the emergency department for evaluation of lower abdominal pain radiating into the left side of her back.  Patient reports that the pain waxes and wanes, experiencing sharp spasms.  She states that she is [redacted] weeks pregnant.  She has had a previous ultrasound at [facility] and was told everything was okay.  She denies any urinary symptoms.  She has not had any vaginal bleeding.  Patient concerned because of prior ectopic as well as previous miscarriage.       Home Medications Prior to Admission medications   Medication Sig Start Date End Date Taking? Authorizing Provider  acetaminophen (TYLENOL) 500 MG tablet Take 1,000 mg by mouth every 6 (six) hours as needed.    [provider]  ALPRAZolam Prudy Feeler) 0.25 MG tablet Take 0.25 mg by mouth 2 (two) times daily as needed for anxiety.    [provider]  Prenatal Vit-Fe Fumarate-FA (PREPLUS) 27-1 MG TABS Take by mouth.    [provider]  sertraline (ZOLOFT) 100 MG tablet Take 100 mg by mouth daily.    [provider]  [name] 0.25-35 MG-MCG tablet TK 1 T PO D 04/08/18 02/18/19  [provider]  etonogestrel (NEXPLANON) 68 MG IMPL implant Nexplanon 68 mg subdermal implant  Inject 1 implant by subcutaneous route.  11/16/19  [provider]  topiramate (TOPAMAX) 25 MG tablet TAKE 4 TABLETS BY MOUTH AT NIGHT TIME 11/17/18 02/18/19  Deetta Perla, MD      Allergies    Ampicillin and Penicillins    Review of Systems   Review of Systems  Physical Exam Updated Vital Signs BP 101/78   Pulse 76   Temp 98.1 F (36.7 C)   Resp 18   Ht 5\' 4"  (1.626 m)   Wt 67.6 kg   LMP 08/10/2021   SpO2 100%   BMI 25.58 kg/m  Physical Exam Vitals and  nursing note reviewed.  Constitutional:      General: She is not in acute distress.    Appearance: She is well-developed.  HENT:     Head: Normocephalic and atraumatic.     Mouth/Throat:     Mouth: Mucous membranes are moist.  Eyes:     General: Vision grossly intact. Gaze aligned appropriately.     Extraocular Movements: Extraocular movements intact.     Conjunctiva/sclera: Conjunctivae normal.  Cardiovascular:     Rate and Rhythm: Normal rate and regular rhythm.     Pulses: Normal pulses.     Heart sounds: Normal heart sounds, S1 normal and S2 normal. No murmur heard.    No friction rub. No gallop.  Pulmonary:     Effort: Pulmonary effort is normal. No respiratory distress.     Breath sounds: Normal breath sounds.  Abdominal:     General: Bowel sounds are normal.     Palpations: Abdomen is soft.     Tenderness: There is abdominal tenderness in the left lower quadrant. There is no guarding or rebound.     Hernia: No hernia is present.  Musculoskeletal:        General: No swelling.     Cervical back: Full passive range of motion without pain, normal range of motion and neck supple.  No spinous process tenderness or muscular tenderness. Normal range of motion.     Right lower leg: No edema.     Left lower leg: No edema.  Skin:    General: Skin is warm and dry.     Capillary Refill: Capillary refill takes less than 2 seconds.     Findings: No ecchymosis, erythema, rash or wound.  Neurological:     General: No focal deficit present.     Mental Status: She is alert and oriented to person, place, and time.     GCS: GCS eye subscore is 4. GCS verbal subscore is 5. GCS motor subscore is 6.     Cranial Nerves: Cranial nerves 2-12 are intact.     Sensory: Sensation is intact.     Motor: Motor function is intact.     Coordination: Coordination is intact.  Psychiatric:        Attention and Perception: Attention normal.        Mood and Affect: Mood normal.        Speech: Speech  normal.        Behavior: Behavior normal.     ED Results / Procedures / Treatments   Labs (all labs ordered are listed, but only abnormal results are displayed) Labs Reviewed  COMPREHENSIVE METABOLIC PANEL - Abnormal; Notable for the following components:      Result Value   AST 13 (*)    All other components within normal limits  CBC - Abnormal; Notable for the following components:   Hemoglobin 11.5 (*)    HCT 35.6 (*)    RDW 17.3 (*)    All other components within normal limits  URINALYSIS, ROUTINE W REFLEX MICROSCOPIC - Abnormal; Notable for the following components:   APPearance HAZY (*)    All other components within normal limits  HCG, QUANTITATIVE, PREGNANCY - Abnormal; Notable for the following components:   hCG, Beta Chain, Quant, S 154,731 (*)    All other components within normal limits  POC URINE PREG, ED - Abnormal; Notable for the following components:   Preg Test, Ur POSITIVE (*)    All other components within normal limits  LIPASE, BLOOD    EKG None  Radiology US OB LESS THAN 14 WEEKS W/ OB TRANSVAGINAL AND DOPPLER  Result Date: 10/18/2021 CLINICAL DATA:  Right pelvic pain. EXAM: OBSTETRIC <14 WK [place] AND TRANSVAGINAL OB US DOPPLER ULTRASOUND OF OVARIES TECHNIQUE: Both transabdominal and transvaginal ultrasound examinations were performed for complete evaluation of the gestation as well as the maternal uterus, adnexal regions, and pelvic cul-de-sac. Transvaginal technique was performed to assess early pregnancy. Color and duplex Doppler ultrasound was utilized to evaluate blood flow to the ovaries. COMPARISON:  Pelvic ultrasound dated 08/10/2021. FINDINGS: Intrauterine gestational sac: Single intrauterine gestational sac. Yolk sac:  Seen Embryo:  Present Cardiac Activity: Detected Heart Rate: 166 bpm CRL:   21 mm   8 w 5 d                  [place] EDC: 05/25/2022 Subchorionic hemorrhage: Small perigestational fluid measuring up to 2 cm in length, likely a subchorionic  hemorrhage. Maternal uterus/adnexae: The ovaries are unremarkable. Pulsed Doppler evaluation of both ovaries demonstrates normal appearing low-resistance arterial and venous waveforms. IMPRESSION: Single live intrauterine pregnancy with an estimated gestational age of [redacted] weeks, 5 days. Electronically Signed   By: Elgie Collard M.D.   On: 10/18/2021 03:54    Procedures Procedures    Medications Ordered in ED  Medications  acetaminophen (TYLENOL) tablet 1,000 mg (1,000 mg Oral Given 10/17/21 2351)    ED Course/ Medical Decision Making/ A&P                           Medical Decision Making Amount and/or Complexity of Data Reviewed Labs: ordered. Radiology: ordered.  Risk OTC drugs.   Presents to the emergency department for evaluation of left lower abdominal and back pain.  Patient is pregnant.  I do not have access to any prenatal care records including no ultrasound report to identify if there is an IUP or ovarian abnormality.  Differential diagnosis considered includes nonobstetric related pain, ectopic pregnancy, miscarriage.  Patient's blood work was unremarkable.  Urinalysis does not suggest kidney stone or kidney infection.  Patient underwent ultrasound to further evaluate.  This does confirm single intrauterine pregnancy without complications.  No evidence of torsion or other pathology to explain her pain.    Upon completion of ultrasound, patient now complaining of severe right lower quadrant pain.  There is tenderness in the right lower quadrant.  Symptoms seem to have switched from the left to the right but I cannot rule out appendicitis.  Discussed with patient risks and benefits of CT scan versus the utility of MRI which is not currently available at [facility] overnight.  I did offer to hold her until the morning to perform the MRI.  Patient indicates that she has childcare issues of her 51-month-old and needs to leave the emergency department to get the 75-month-old  and make childcare arrangements.   Discussed with Dr. Claiborne Billings, patient's OB/GYN at [facility].  Dr Claiborne Billings recommended discharging the patient and having her go to MAU for remainder of work-up if the patient was not comfortable staying at [facility] until the MRI is available.  I did discuss this with the patient.  She would prefer to be discharged now over staying in the department here until MRI is available, as it will allow her to bring her child to another babysitter and then head to [facility].       Final Clinical Impression(s) / ED Diagnoses Final diagnoses:  Pelvic pain in female  Abdominal pain in pregnancy, first trimester    Rx / DC Orders ED Discharge Orders     None         Ayah Cozzolino, Canary Brim, MD 10/18/21 0411    Gilda Crease, MD 10/18/21 [phone]

## 2021-10-18 ENCOUNTER — Encounter (HOSPITAL_COMMUNITY): Payer: Self-pay | Admitting: Obstetrics and Gynecology

## 2021-10-18 ENCOUNTER — Emergency Department (HOSPITAL_COMMUNITY): Payer: Medicaid Other

## 2021-10-18 ENCOUNTER — Other Ambulatory Visit: Payer: Self-pay

## 2021-10-18 ENCOUNTER — Inpatient Hospital Stay (EMERGENCY_DEPARTMENT_HOSPITAL)
Admission: AD | Admit: 2021-10-18 | Discharge: 2021-10-18 | Disposition: A | Discharge status: Home / Self Care | Payer: Medicaid Other | Source: Home / Self Care | Attending: Obstetrics and Gynecology | Admitting: Obstetrics and Gynecology

## 2021-10-18 ENCOUNTER — Inpatient Hospital Stay (HOSPITAL_COMMUNITY): Payer: Medicaid Other

## 2021-10-18 DIAGNOSIS — R1011 Right upper quadrant pain: Secondary | ICD-10-CM | POA: Insufficient documentation

## 2021-10-18 DIAGNOSIS — K219 Gastro-esophageal reflux disease without esophagitis: Secondary | ICD-10-CM

## 2021-10-18 DIAGNOSIS — Z3A08 8 weeks gestation of pregnancy: Secondary | ICD-10-CM | POA: Insufficient documentation

## 2021-10-18 DIAGNOSIS — O26891 Other specified pregnancy related conditions, first trimester: Secondary | ICD-10-CM | POA: Insufficient documentation

## 2021-10-18 LAB — CBC
HCT: 34.4 % — ABNORMAL LOW (ref 36.0–46.0)
Hemoglobin: 11.4 g/dL — ABNORMAL LOW (ref 12.0–15.0)
MCH: 26 pg (ref 26.0–34.0)
MCHC: 33.1 g/dL (ref 30.0–36.0)
MCV: 78.5 fL — ABNORMAL LOW (ref 80.0–100.0)
Platelets: 294 10*3/uL (ref 150–400)
RBC: 4.38 MIL/uL (ref 3.87–5.11)
RDW: 17.1 % — ABNORMAL HIGH (ref 11.5–15.5)
WBC: 10.2 10*3/uL (ref 4.0–10.5)
nRBC: 0 % (ref 0.0–0.2)

## 2021-10-18 LAB — COMPREHENSIVE METABOLIC PANEL
ALT: 15 U/L (ref 0–44)
AST: 17 U/L (ref 15–41)
Albumin: 3.8 g/dL (ref 3.5–5.0)
Alkaline Phosphatase: 59 U/L (ref 38–126)
Anion gap: 6 (ref 5–15)
BUN: 6 mg/dL (ref 6–20)
CO2: 24 mmol/L (ref 22–32)
Calcium: 10.5 mg/dL — ABNORMAL HIGH (ref 8.9–10.3)
Chloride: 107 mmol/L (ref 98–111)
Creatinine, Ser: 0.68 mg/dL (ref 0.44–1.00)
GFR, Estimated: >60 mL/min (ref >60)
Glucose, Bld: 104 mg/dL — ABNORMAL HIGH (ref 70–99)
Potassium: 4 mmol/L (ref 3.5–5.1)
Sodium: 137 mmol/L (ref 135–145)
Total Bilirubin: 0.6 mg/dL (ref 0.3–1.2)
Total Protein: 6.8 g/dL (ref 6.5–8.1)

## 2021-10-18 MED ORDER — FAMOTIDINE 20 MG PO TABS: 20.0000 mg | ORAL_TABLET | Freq: Two times a day (BID) | ORAL | 2 refills | Status: DC | PRN

## 2021-10-18 MED ORDER — LIDOCAINE VISCOUS HCL 2 % MT SOLN
15.0000 mL | Freq: Once | OROMUCOSAL | Status: AC
Start: 2021-10-18 — End: 2021-10-18
  Administered 2021-10-18: 15 mL via OROMUCOSAL
  Filled 2021-10-18: qty 15

## 2021-10-18 MED ORDER — PANTOPRAZOLE SODIUM 40 MG PO TBEC
40.0000 mg | DELAYED_RELEASE_TABLET | Freq: Once | ORAL | Status: AC
  Administered 2021-10-18: 40 mg via ORAL
  Filled 2021-10-18 (×3): qty 1

## 2021-10-18 MED ORDER — CYCLOBENZAPRINE HCL 5 MG PO TABS
5.0000 mg | ORAL_TABLET | Freq: Once | ORAL | Status: AC
  Administered 2021-10-18: 5 mg via ORAL
  Filled 2021-10-18: qty 1

## 2021-10-18 NOTE — ED Notes (Signed)
Back from ultrasound

## 2021-10-18 NOTE — MAU Note (Signed)
Hannah Walker is a 21 y.o. at [redacted]w[redacted]d here in MAU reporting: yesterday, about after she ate, she had sudden onset pain in upper abd around into back.  Had [place] of baby- everything was fine, +IUP, which she already knew. Was told she could wait for an MRI, she is claustrophobic and knew she would not be able to tolerate that, didn't feel it was necessary. Pain had eased off some, finally ate around 1630, pain returned "full throttle".  Feels really nauseous.   Onset of complaint: yesterday Pain score: 6 Vitals:   10/18/21 1742  BP: 114/66  Pulse: 96  Resp: 18  Temp: 98.3 F (36.8 C)  SpO2: 100%     Lab orders placed from triage:  none  Had labs and OB US at [facility] last night

## 2021-10-18 NOTE — MAU Provider Note (Addendum)
History     [id number]  Arrival date and time: 10/18/21 1722    No chief complaint on file.    HPI Hannah Walker is a 21 y.o. at [redacted]w[redacted]d by [place], who presents for abdominal pain.   Patient was seen for the same issue yesterday at [facility] She reports she was evaluated for ectopic pregnancy and then discharged without resolution of her pain On review of chart she was offered MRI for evaluation for appendicitis but was unable to stay due to child care issues  Today reports that she has had significant abdominal pain since yesterday Notices a definite relationship with food, with pain flaring about 30-45 min after eating Has also been having significant nausea since symptoms started Pain is constant, locates it primarily in the RUQ with some radiation to her back and down to RLQ No obstetrical complaints, no vaginal bleeding or leaking fluid Does endorse history of kidney stones but not an issue currently   --/--/O POS (01/12 0900)  OB History     Gravida  4   Para  1   Term  1   Preterm  0   AB  2   Living  1      SAB  2   IAB  0   Ectopic  0   Multiple  0   Live Births  1           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   Hyperemesis gravidarum 12/21/2020   PCO (polycystic ovaries)    Renal disorder    kidney stones   Tachycardia    Urine retention    Vaginal Pap smear, abnormal     Past Surgical History:  Procedure Laterality Date   DILATION AND EVACUATION N/A 04/13/2021   Procedure: DILATATION AND EVACUATION;  Surgeon: Charlett Nose, MD;  Location: [facility] LD ORS;  Service: Gynecology;  Laterality: N/A;   TENDON REPAIR Right 05/15/2018   Procedure: EXTENSOR TENDON REPAIR RIGHT THUMB;  Surgeon: Knute Neu, MD;  Location: [facility] OR;  Service: Plastics;  Laterality: Right;    Family History  Problem Relation Age of Onset   Healthy Mother    Thyroid disease Mother     Healthy Father    Thyroid disease Maternal Grandmother     Social History   Socioeconomic History   Marital status: Single    Spouse name: Not on file   Number of children: Not on file   Years of education: Not on file   Highest education level: Not on file  Occupational History   Not on file  Tobacco Use   Smoking status: Former    Types: E-cigarettes    Quit date: 07/30/2020    Years since quitting: 1.2   Smokeless tobacco: Never  Vaping Use   Vaping Use: Former   Quit date: 08/13/2020   Substances: Nicotine  Substance and Sexual Activity   Alcohol use: No   Drug use: No   Sexual activity: Yes  Other Topics Concern   Not on file  Social History Narrative   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   She was home schooled.   She lives with her dad only. She has no siblings.   She enjoys playing with her dogs and being on her phone.   Social Determinants of Health   Financial Resource Strain: Not on file  Food  Insecurity: Not on file  Transportation Needs: Not on file  Physical Activity: Not on file  Stress: Not on file  Social Connections: Not on file  Intimate Partner Violence: Not on file    Allergies  Allergen Reactions   Ampicillin Anaphylaxis    Has patient had a PCN reaction causing immediate rash, facial/tongue/throat swelling, SOB or lightheadedness with hypotension: Yes Has patient had a PCN reaction causing severe rash involving mucus membranes or skin necrosis: No Has patient had a PCN reaction that required hospitalization: No Has patient had a PCN reaction occurring within the last 10 years: Yes If all of the above answers are "NO", then may proceed with Cephalosporin use.    Penicillins Anaphylaxis    No current facility-administered medications on file prior to encounter.   Current Outpatient Medications on File Prior to Encounter  Medication Sig Dispense Refill   ALPRAZolam (XANAX) 0.25 MG tablet Take 0.25 mg by mouth 2 (two) times daily as needed  for anxiety.     Doxylamine-Pyridoxine (DICLEGIS PO) Take by mouth at bedtime.     Iron-Vitamin C (IRON 100/C PO) Take by mouth.     Prenatal Vit-Fe Fumarate-FA (PREPLUS) 27-1 MG TABS Take by mouth.     promethazine (PHENERGAN) 25 MG tablet Take 25 mg by mouth every 6 (six) hours as needed for nausea or vomiting.     sertraline (ZOLOFT) 100 MG tablet Take 50 mg by mouth daily.     acetaminophen (TYLENOL) 500 MG tablet Take 1,000 mg by mouth every 6 (six) hours as needed.     [DISCONTINUED] ESTARYLLA 0.25-35 MG-MCG tablet TK 1 T PO D     [DISCONTINUED] etonogestrel (NEXPLANON) 68 MG IMPL implant Nexplanon 68 mg subdermal implant  Inject 1 implant by subcutaneous route.     [DISCONTINUED] topiramate (TOPAMAX) 25 MG tablet TAKE 4 TABLETS BY MOUTH AT NIGHT TIME 124 tablet 0     ROS Pertinent positives and negative per HPI, all others reviewed and negative  Physical Exam   BP 102/64   Pulse 95   Temp 98.3 F (36.8 C) (Oral)   Resp 18   Ht 5\' 5"  (1.651 m)   Wt 68.5 kg   LMP 08/10/2021   SpO2 100%   BMI 25.13 kg/m   Patient Vitals for the past 24 hrs:  BP Temp Temp src Pulse Resp SpO2 Height Weight  10/18/21 1810 102/64 -- -- 95 -- -- -- --  10/18/21 1742 114/66 98.3 F (36.8 C) Oral 96 18 100 % 5\' 5"  (1.651 m) 68.5 kg    Physical Exam Vitals reviewed.  Constitutional:      General: She is not in acute distress.    Appearance: She is well-developed. She is not diaphoretic.  Eyes:     General: No scleral icterus. Pulmonary:     Effort: Pulmonary effort is normal. No respiratory distress.  Abdominal:     General: Abdomen is flat. There is no distension.     Palpations: Abdomen is soft.     Tenderness: There is abdominal tenderness. There is no guarding or rebound.     Comments: Tender to palpation in the RUQ particularly, equivocal Murphy's sign. Also very tender in RLQ.   Skin:    General: Skin is warm and dry.  Neurological:     Mental Status: She is alert.      Coordination: Coordination normal.      Cervical Exam    Bedside Ultrasound Not done  My interpretation: n/a  Labs Results for orders placed or performed during the hospital encounter of 10/18/21 (from the past 24 hour(s))  CBC     Status: Abnormal   Collection Time: 10/18/21  6:06 PM  Result Value Ref Range   WBC 10.2 4.0 - 10.5 K/uL   RBC 4.38 3.87 - 5.11 MIL/uL   Hemoglobin 11.4 (L) 12.0 - 15.0 g/dL   HCT 73.2 (L) 20.2 - 54.2 %   MCV 78.5 (L) 80.0 - 100.0 fL   MCH 26.0 26.0 - 34.0 pg   MCHC 33.1 30.0 - 36.0 g/dL   RDW 70.6 (H) 23.7 - 62.8 %   Platelets 294 150 - 400 K/uL   nRBC 0.0 0.0 - 0.2 %  Comprehensive metabolic panel     Status: Abnormal   Collection Time: 10/18/21  6:06 PM  Result Value Ref Range   Sodium 137 135 - 145 mmol/L   Potassium 4.0 3.5 - 5.1 mmol/L   Chloride 107 98 - 111 mmol/L   CO2 24 22 - 32 mmol/L   Glucose, Bld 104 (H) 70 - 99 mg/dL   BUN 6 6 - 20 mg/dL   Creatinine, Ser 3.15 0.44 - 1.00 mg/dL   Calcium 17.6 (H) 8.9 - 10.3 mg/dL   Total Protein 6.8 6.5 - 8.1 g/dL   Albumin 3.8 3.5 - 5.0 g/dL   AST 17 15 - 41 U/L   ALT 15 0 - 44 U/L   Alkaline Phosphatase 59 38 - 126 U/L   Total Bilirubin 0.6 0.3 - 1.2 mg/dL   GFR, Estimated >16 >07 mL/min   Anion gap 6 5 - 15    Imaging US Abdomen Limited RUQ (LIVER/GB)  Result Date: 10/18/2021 CLINICAL DATA:  Epigastric abdominal pain. Patient is [redacted] weeks pregnant. EXAM: ULTRASOUND ABDOMEN LIMITED RIGHT UPPER QUADRANT COMPARISON:  CT scan 07/13/2020 FINDINGS: Gallbladder: No gallstones or wall thickening visualized. No sonographic Murphy sign noted by sonographer. Common bile duct: Diameter: 3.7 mm Liver: Normal echogenicity without focal lesion or biliary dilatation. Portal vein is patent on color Doppler imaging with normal direction of blood flow towards the liver. Other: None. IMPRESSION: Unremarkable right upper quadrant ultrasound examination. Electronically Signed   By: Rudie Meyer M.D.   On:  10/18/2021 19:27   US OB LESS THAN 14 WEEKS W/ OB TRANSVAGINAL AND DOPPLER  Result Date: 10/18/2021 CLINICAL DATA:  Right pelvic pain. EXAM: OBSTETRIC <14 WK [place] AND TRANSVAGINAL OB US DOPPLER ULTRASOUND OF OVARIES TECHNIQUE: Both transabdominal and transvaginal ultrasound examinations were performed for complete evaluation of the gestation as well as the maternal uterus, adnexal regions, and pelvic cul-de-sac. Transvaginal technique was performed to assess early pregnancy. Color and duplex Doppler ultrasound was utilized to evaluate blood flow to the ovaries. COMPARISON:  Pelvic ultrasound dated 08/10/2021. FINDINGS: Intrauterine gestational sac: Single intrauterine gestational sac. Yolk sac:  Seen Embryo:  Present Cardiac Activity: Detected Heart Rate: 166 bpm CRL:   21 mm   8 w 5 d                  [place] EDC: 05/25/2022 Subchorionic hemorrhage: Small perigestational fluid measuring up to 2 cm in length, likely a subchorionic hemorrhage. Maternal uterus/adnexae: The ovaries are unremarkable. Pulsed Doppler evaluation of both ovaries demonstrates normal appearing low-resistance arterial and venous waveforms. IMPRESSION: Single live intrauterine pregnancy with an estimated gestational age of [redacted] weeks, 5 days. Electronically Signed   By: Elgie Collard M.D.   On: 10/18/2021 03:54    MAU Course  Procedures Lab Orders         CBC         Comprehensive metabolic panel         Urinalysis, Routine w reflex microscopic Urine, Clean Catch     Meds ordered this encounter  Medications   pantoprazole (PROTONIX) EC tablet 40 mg   lidocaine (XYLOCAINE) 2 % viscous mouth solution 15 mL   cyclobenzaprine (FLEXERIL) tablet 5 mg   Imaging Orders         US Abdomen Limited RUQ (LIVER/GB)     MDM moderate  Assessment and Plan  #RUQ pain #[redacted] weeks gestation of pregnancy Unclear etiology at this point. Repeat CBC shows no white count and CMP remains unremarkable. RUQ [place] did not show any gallbladder pathology  which was my primary consideration up to this point. Due to pharmacy delay has not had viscous lido and PPI until just now. Will wait a bit to see if this therapeutic/diagnostic trial of meds is effective. If not discussed with her will need to pursue MRI, she is in agreement with this plan. She also asked to trial flexeril which she ran out of recently. Low likelihood this will help but won't hurt, ordered for 5 mg as well.   Care signed out to [facility] at shift change.    Venora Maples, MD/MPH 10/18/21 8:02 PM   MDM:  Pt pain significantly improved following viscous lidocaine and Protonix.  Pt with hx of severe GERD with previous pregnancy but never pain like this.  Discussed option of daily PPI, or trying Pepcid BID and using PRN if symptoms improve.  Pt to try Pepcid and f/u with OB if needs to change regimen.  Reviewed s/sx of more acute process, reasons to return to MAU.  Consider MRI if symptoms return or are more severe.  D/C home with return precautions.  A/P: 1. Right upper quadrant abdominal pain   2. Right lower quadrant abdominal pain affecting pregnancy in first trimester   3. [redacted] weeks gestation of pregnancy   4. Gastroesophageal reflux disease without esophagitis      Sharen Counter, CNM 10:11 PM

## 2021-10-18 NOTE — Discharge Instructions (Addendum)
Go to the Maternal Admission Unit at [facility] to be seen now.

## 2021-10-18 NOTE — ED Notes (Signed)
Pt is in ultrasound.

## 2021-10-22 ENCOUNTER — Encounter: Payer: Medicaid Other | Admitting: Student

## 2021-10-23 ENCOUNTER — Ambulatory Visit: Payer: Medicaid Other

## 2021-10-30 ENCOUNTER — Telehealth: Payer: Medicaid Other

## 2021-10-30 ENCOUNTER — Ambulatory Visit: Payer: Medicaid Other

## 2021-12-30 ENCOUNTER — Inpatient Hospital Stay (HOSPITAL_COMMUNITY): Payer: Medicaid Other

## 2021-12-30 ENCOUNTER — Inpatient Hospital Stay (HOSPITAL_COMMUNITY)
Admission: AD | Admit: 2021-12-30 | Discharge: 2021-12-30 | Disposition: A | Discharge status: Home / Self Care | Payer: Medicaid Other | Attending: Obstetrics and Gynecology | Admitting: Obstetrics and Gynecology

## 2021-12-30 ENCOUNTER — Encounter (HOSPITAL_COMMUNITY): Payer: Self-pay | Admitting: Obstetrics and Gynecology

## 2021-12-30 DIAGNOSIS — O26899 Other specified pregnancy related conditions, unspecified trimester: Secondary | ICD-10-CM

## 2021-12-30 DIAGNOSIS — Z87440 Personal history of urinary (tract) infections: Secondary | ICD-10-CM | POA: Diagnosis not present

## 2021-12-30 DIAGNOSIS — R109 Unspecified abdominal pain: Secondary | ICD-10-CM | POA: Diagnosis not present

## 2021-12-30 DIAGNOSIS — O23592 Infection of other part of genital tract in pregnancy, second trimester: Secondary | ICD-10-CM | POA: Insufficient documentation

## 2021-12-30 DIAGNOSIS — B9689 Other specified bacterial agents as the cause of diseases classified elsewhere: Secondary | ICD-10-CM | POA: Insufficient documentation

## 2021-12-30 DIAGNOSIS — Z87442 Personal history of urinary calculi: Secondary | ICD-10-CM | POA: Diagnosis not present

## 2021-12-30 DIAGNOSIS — O26892 Other specified pregnancy related conditions, second trimester: Secondary | ICD-10-CM | POA: Diagnosis not present

## 2021-12-30 DIAGNOSIS — R102 Pelvic and perineal pain: Secondary | ICD-10-CM

## 2021-12-30 DIAGNOSIS — Z3A19 19 weeks gestation of pregnancy: Secondary | ICD-10-CM | POA: Diagnosis not present

## 2021-12-30 LAB — CBC WITH DIFFERENTIAL/PLATELET
Abs Immature Granulocytes: 0.05 10*3/uL (ref 0.00–0.07)
Basophils Absolute: 0 10*3/uL (ref 0.0–0.1)
Basophils Relative: 0 %
Eosinophils Absolute: 0.3 10*3/uL (ref 0.0–0.5)
Eosinophils Relative: 2 %
HCT: 29.5 % — ABNORMAL LOW (ref 36.0–46.0)
Hemoglobin: 10.1 g/dL — ABNORMAL LOW (ref 12.0–15.0)
Immature Granulocytes: 0 %
Lymphocytes Relative: 26 %
Lymphs Abs: 3.1 10*3/uL (ref 0.7–4.0)
MCH: 28.5 pg (ref 26.0–34.0)
MCHC: 34.2 g/dL (ref 30.0–36.0)
MCV: 83.1 fL (ref 80.0–100.0)
Monocytes Absolute: 0.7 10*3/uL (ref 0.1–1.0)
Monocytes Relative: 6 %
Neutro Abs: 7.6 10*3/uL (ref 1.7–7.7)
Neutrophils Relative %: 66 %
Platelets: 232 10*3/uL (ref 150–400)
RBC: 3.55 MIL/uL — ABNORMAL LOW (ref 3.87–5.11)
RDW: 16.2 % — ABNORMAL HIGH (ref 11.5–15.5)
WBC: 11.7 10*3/uL — ABNORMAL HIGH (ref 4.0–10.5)
nRBC: 0 % (ref 0.0–0.2)

## 2021-12-30 LAB — COMPREHENSIVE METABOLIC PANEL
ALT: 10 U/L (ref 0–44)
AST: 14 U/L — ABNORMAL LOW (ref 15–41)
Albumin: 3.1 g/dL — ABNORMAL LOW (ref 3.5–5.0)
Alkaline Phosphatase: 61 U/L (ref 38–126)
Anion gap: 10 (ref 5–15)
BUN: 6 mg/dL (ref 6–20)
CO2: 21 mmol/L — ABNORMAL LOW (ref 22–32)
Calcium: 10.7 mg/dL — ABNORMAL HIGH (ref 8.9–10.3)
Chloride: 106 mmol/L (ref 98–111)
Creatinine, Ser: 0.53 mg/dL (ref 0.44–1.00)
GFR, Estimated: >60 mL/min (ref >60)
Glucose, Bld: 87 mg/dL (ref 70–99)
Potassium: 3.3 mmol/L — ABNORMAL LOW (ref 3.5–5.1)
Sodium: 137 mmol/L (ref 135–145)
Total Bilirubin: 0.2 mg/dL — ABNORMAL LOW (ref 0.3–1.2)
Total Protein: 6.2 g/dL — ABNORMAL LOW (ref 6.5–8.1)

## 2021-12-30 LAB — URINALYSIS, ROUTINE W REFLEX MICROSCOPIC
Bilirubin Urine: NEGATIVE
Glucose, UA: NEGATIVE mg/dL
Hgb urine dipstick: NEGATIVE
Ketones, ur: NEGATIVE mg/dL
Leukocytes,Ua: NEGATIVE
Nitrite: NEGATIVE
Protein, ur: NEGATIVE mg/dL
Specific Gravity, Urine: 1.014 (ref 1.005–1.030)
pH: 6 (ref 5.0–8.0)

## 2021-12-30 LAB — WET PREP, GENITAL
Sperm: NONE SEEN
Trich, Wet Prep: NONE SEEN
WBC, Wet Prep HPF POC: >=10 — AB (ref <10)
Yeast Wet Prep HPF POC: NONE SEEN

## 2021-12-30 MED ORDER — METRONIDAZOLE 500 MG PO TABS: 500.0000 mg | ORAL_TABLET | Freq: Two times a day (BID) | ORAL | 0 refills | Status: DC

## 2021-12-30 MED ORDER — ACETAMINOPHEN 500 MG PO TABS
1000.0000 mg | ORAL_TABLET | Freq: Once | ORAL | Status: AC
Start: 2021-12-30 — End: 2021-12-30
  Administered 2021-12-30: 1000 mg via ORAL
  Filled 2021-12-30: qty 2

## 2021-12-30 MED ORDER — PROMETHAZINE HCL 25 MG PO TABS: 25.0000 mg | ORAL_TABLET | Freq: Four times a day (QID) | ORAL | 1 refills | Status: DC | PRN

## 2021-12-30 NOTE — Discharge Instructions (Signed)

## 2021-12-30 NOTE — MAU Provider Note (Addendum)
History     CSN: [id number]  Arrival date and time: 12/30/21 1958  Event Date/Time  First Provider Initiated Contact with Patient 12/30/21 2041     Chief Complaint  Patient presents with   Back Pain   HPI Hannah Walker is a 21 y.o. [id number] at [redacted]w[redacted]d who presents to MAU with chief complaints of right mid abdominal and mid back pain. Pain started last Wednesday 12/26/2021, is "sharp" and occurs while voiding. Pain has gradually intensified since onset. She has experienced some relief with Tylenol, last administration early this morning. Patient endorses history of kidney stones and UTIs. She denies abdominal tenderness, hematuria, fever or recent illness.  Patient reports chronic dysuria, onset following the birth of her baby 04/2021. She receives care with pelvic floor physical therapy and has noted improvement. She states today's complaint is present but unchanged from her baseline.  Patient reports possible STI exposure and new onset abnormal vaginal discharge. She requests screening for STIs.  Patient's next OB appointment is Tuesday 01/01/2022.  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   Hyperemesis gravidarum 12/21/2020   PCO (polycystic ovaries)    Renal disorder    kidney stones   Tachycardia    Urine retention    Vaginal Pap smear, abnormal     Past Surgical History:  Procedure Laterality Date   DILATION AND EVACUATION N/A 04/13/2021   Procedure: DILATATION AND EVACUATION;  Surgeon: Rowland Lathe, MD;  Location: [facility];  Service: Gynecology;  Laterality: N/A;   TENDON REPAIR Right 05/15/2018   Procedure: EXTENSOR TENDON REPAIR RIGHT THUMB;  Surgeon: Dayna Barker, MD;  Location: [facility];  Service: Plastics;   Laterality: Right;    Family History  Problem Relation Age of Onset   Healthy Mother    Thyroid disease Mother    Healthy Father    Thyroid disease Maternal Grandmother     Social History   Tobacco Use   Smoking status: Former    Types: E-cigarettes    Quit date: 07/30/2020    Years since quitting: 1.4   Smokeless tobacco: Never  Vaping Use   Vaping Use: Former   Quit date: 08/13/2020   Substances: Nicotine  Substance Use Topics   Alcohol use: No   Drug use: No    Allergies:  Allergies  Allergen Reactions   Ampicillin Anaphylaxis    Has patient had a PCN reaction causing immediate rash, facial/tongue/throat swelling, SOB or lightheadedness with hypotension: Yes Has patient had a PCN reaction causing severe rash involving mucus membranes or skin necrosis: No Has patient had a PCN reaction that required hospitalization: No Has patient had a PCN reaction occurring within the last 10 years: Yes If all of the above answers are "NO", then may proceed with Cephalosporin use.    Penicillins Anaphylaxis   Tape Rash    Adhesive tape of any kind     Medications Prior to Admission  Medication Sig Dispense Refill Last Dose  lamoTRIgine (LAMICTAL) 25 MG tablet Take 25 mg by mouth daily.   12/30/2021   acetaminophen (TYLENOL) 500 MG tablet Take 1,000 mg by mouth every 6 (six) hours as needed.      ALPRAZolam (XANAX) 1 MG tablet Take 1 mg by mouth at bedtime as needed for anxiety.   More than a month   Doxylamine-Pyridoxine (DICLEGIS PO) Take by mouth at bedtime.      famotidine (PEPCID) 20 MG tablet Take 1 tablet (20 mg total) by mouth 2 (two) times daily as needed for heartburn or indigestion. 60 tablet 2    Iron-Vitamin C (IRON 100/C PO) Take by mouth.      Prenatal Vit-Fe Fumarate-FA (PREPLUS) 27-1 MG TABS Take by mouth.      promethazine (PHENERGAN) 25 MG tablet Take 25 mg by mouth every 6 (six) hours as needed for nausea or vomiting.      sertraline (ZOLOFT) 100 MG tablet Take  50 mg by mouth daily.       Review of Systems  Gastrointestinal:  Positive for abdominal pain.  Musculoskeletal:  Positive for back pain.  All other systems reviewed and are negative.  Physical Exam   Blood pressure 105/66, pulse 82, temperature 98.2 F (36.8 C), temperature source Oral, resp. rate 18, height 5\' 5"  (1.651 m), weight 69.1 kg, last menstrual period 08/10/2021, SpO2 99 %, not currently breastfeeding.  Physical Exam Vitals and nursing note reviewed. Exam conducted with a chaperone present.  Constitutional:      Appearance: Normal appearance. She is not ill-appearing.  Cardiovascular:     Rate and Rhythm: Normal rate and regular rhythm.     Pulses: Normal pulses.     Heart sounds: Normal heart sounds.  Pulmonary:     Effort: Pulmonary effort is normal.     Breath sounds: Normal breath sounds.  Abdominal:     Tenderness: There is no abdominal tenderness. There is no right CVA tenderness or left CVA tenderness.     Comments: Gravid  Skin:    Capillary Refill: Capillary refill takes less than 2 seconds.  Neurological:     Mental Status: She is alert and oriented to person, place, and time.  Psychiatric:        Mood and Affect: Mood normal.        Behavior: Behavior normal.        Thought Content: Thought content normal.        Judgment: Judgment normal.     MAU Course  Procedures  MDM --Very mild leukocytosis (11.7) --Pertinent negatives: CVAT, hematuria, abnormal UA, fever, dysuria different from baseline --Normal renal 10/10/2021. Given history of kidney stones, discussed preemptive regimen with Phenergan, lemon water, watch for passing stone. Continue Tylenol PRN  Patient Vitals for the past 24 hrs:  BP Temp Temp src Pulse Resp SpO2 Height Weight  12/30/21 2214 109/68 -- -- 77 -- -- -- --  12/30/21 2026 105/66 98.2 F (36.8 C) Oral 82 18 99 % 5\' 5"  (1.651 m) 69.1 kg   Results for orders placed or performed during the hospital encounter of 12/30/21 (from the  past 24 hour(s))  Urinalysis, Routine w reflex microscopic Urine, Clean Catch     Status: None   Collection Time: 12/30/21  8:03 PM  Result Value Ref Range   Color, Urine YELLOW YELLOW   APPearance CLEAR CLEAR   Specific Gravity, Urine 1.014 1.005 - 1.030   pH 6.0 5.0 - 8.0   Glucose, UA NEGATIVE NEGATIVE mg/dL  Hgb urine dipstick NEGATIVE NEGATIVE   Bilirubin Urine NEGATIVE NEGATIVE   Ketones, ur NEGATIVE NEGATIVE mg/dL   Protein, ur NEGATIVE NEGATIVE mg/dL   Nitrite NEGATIVE NEGATIVE   Leukocytes,Ua NEGATIVE NEGATIVE  CBC with Differential/Platelet     Status: Abnormal   Collection Time: 12/30/21  8:59 PM  Result Value Ref Range   WBC 11.7 (H) 4.0 - 10.5 K/uL   RBC 3.55 (L) 3.87 - 5.11 MIL/uL   Hemoglobin 10.1 (L) 12.0 - 15.0 g/dL   HCT 63.8 (L) 75.6 - 43.3 %   MCV 83.1 80.0 - 100.0 fL   MCH 28.5 26.0 - 34.0 pg   MCHC 34.2 30.0 - 36.0 g/dL   RDW 29.5 (H) 18.8 - 41.6 %   Platelets 232 150 - 400 K/uL   nRBC 0.0 0.0 - 0.2 %   Neutrophils Relative % 66 %   Neutro Abs 7.6 1.7 - 7.7 K/uL   Lymphocytes Relative 26 %   Lymphs Abs 3.1 0.7 - 4.0 K/uL   Monocytes Relative 6 %   Monocytes Absolute 0.7 0.1 - 1.0 K/uL   Eosinophils Relative 2 %   Eosinophils Absolute 0.3 0.0 - 0.5 K/uL   Basophils Relative 0 %   Basophils Absolute 0.0 0.0 - 0.1 K/uL   Immature Granulocytes 0 %   Abs Immature Granulocytes 0.05 0.00 - 0.07 K/uL  Comprehensive metabolic panel     Status: Abnormal   Collection Time: 12/30/21  8:59 PM  Result Value Ref Range   Sodium 137 135 - 145 mmol/L   Potassium 3.3 (L) 3.5 - 5.1 mmol/L   Chloride 106 98 - 111 mmol/L   CO2 21 (L) 22 - 32 mmol/L   Glucose, Bld 87 70 - 99 mg/dL   BUN 6 6 - 20 mg/dL   Creatinine, Ser 6.06 0.44 - 1.00 mg/dL   Calcium 30.1 (H) 8.9 - 10.3 mg/dL   Total Protein 6.2 (L) 6.5 - 8.1 g/dL   Albumin 3.1 (L) 3.5 - 5.0 g/dL   AST 14 (L) 15 - 41 U/L   ALT 10 0 - 44 U/L   Alkaline Phosphatase 61 38 - 126 U/L   Total Bilirubin 0.2 (L) 0.3 -  1.2 mg/dL   GFR, Estimated >60 >10 mL/min   Anion gap 10 5 - 15  Wet prep, genital     Status: Abnormal   Collection Time: 12/30/21 10:14 PM  Result Value Ref Range   Yeast Wet Prep HPF POC NONE SEEN NONE SEEN   Trich, Wet Prep NONE SEEN NONE SEEN   Clue Cells Wet Prep HPF POC PRESENT (A) NONE SEEN   WBC, Wet Prep HPF POC >=10 (A) <10   Sperm NONE SEEN    Meds ordered this encounter  Medications   acetaminophen (TYLENOL) tablet 1,000 mg   promethazine (PHENERGAN) 25 MG tablet    Sig: Take 1 tablet (25 mg total) by mouth every 6 (six) hours as needed for nausea or vomiting.    Dispense:  30 tablet    Refill:  1    Order Specific Question:   Supervising Provider    Answer:   Fabio Bering   metroNIDAZOLE (FLAGYL) 500 MG tablet    Sig: Take 1 tablet (500 mg total) by mouth 2 (two) times daily.    Dispense:  14 tablet    Refill:  0    Order Specific Question:   Supervising Provider    Answer:   Milas Hock [[id number]]  Assessment and Plan  --21 y.o. [id number] at [redacted]w[redacted]d  --FHT 142 by Doppler --Well appearing --No atypical findings on renal US, advised preemptive symptom management as above --Bacterial Vaginosis --Patient to reconnect with existing Urology team --Urine culture collected --Discharge home in stable condition with return precautions  F/U: --Next OB appointment is Tuesday 01/01/2022  Calvert Cantor, MSA, MSN, CNM 12/30/2021, 10:34 PM

## 2021-12-30 NOTE — MAU Note (Signed)
.  BRIDGETT HATTABAUGH is a 21 y.o. at [redacted]w[redacted]d here in MAU reporting: sharp right sided flank pain since Weds, and has been constant with a dull ache and intermittent sharp. Pt reports today when she went to void she had a sudden sharp pain across her back that was intense and has been constant since. Pt states she has hx of UTI and urinary retention. Pt is in pelvic rehab as well. Pt reports she went to urgent care but was sent to MAU. Pt reports urinary frequency that is newly onset, but has dysuria and painful urination that is ongoing since her last delivery. Pt denies DFM, VB, LOF.   Onset of complaint: Weds Pain score: 6/10 Vitals:   12/30/21 2026  BP: 105/66  Pulse: 82  Resp: 18  Temp: 98.2 F (36.8 C)  SpO2: 99%     FHT:142 Lab orders placed from triage:  ua

## 2021-12-31 LAB — CULTURE, OB URINE: Culture: NO GROWTH

## 2021-12-31 LAB — GC/CHLAMYDIA PROBE AMP (~~LOC~~) NOT AT ARMC
Chlamydia: NEGATIVE
Comment: NEGATIVE
Comment: NORMAL
Neisseria Gonorrhea: NEGATIVE

## 2022-02-21 IMAGING — CT CT RENAL STONE PROTOCOL
3 of 4 series · 7 of 46 positions shown, 13 images · non-contrast
Comparison: 10/31/2019

CLINICAL DATA: Flank pain, nephrolithiasis, lower abdominal pain

EXAM:
CT ABDOMEN AND PELVIS WITHOUT CONTRAST
TECHNIQUE: Multidetector CT imaging of the abdomen and pelvis was performed
following the standard protocol without IV contrast.

[Series 4: lungs · axial · 0.65mm/px · z∈[-520,-480]mm · 3 of 18 slices shown, 7 images]
[im 5/18  soft-tissue]
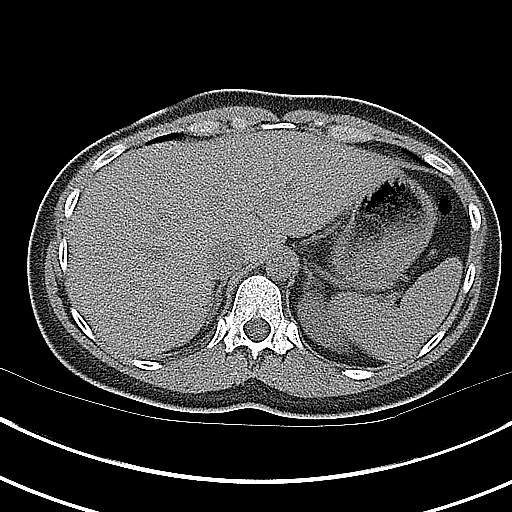
[im 5/18  lung]
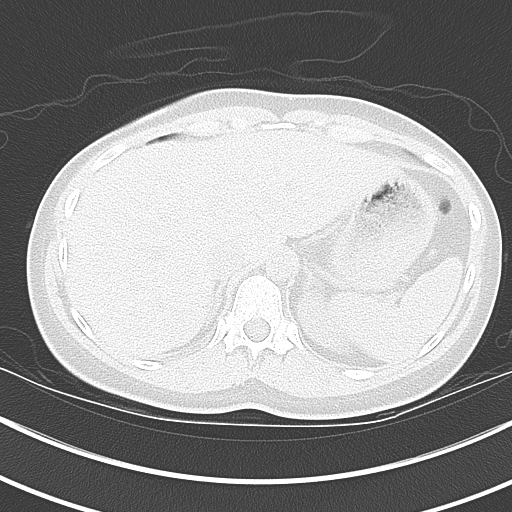
[im 5/18  bone]
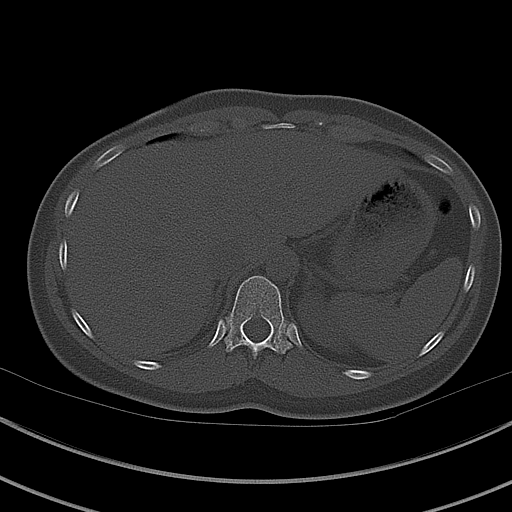
[im 9/18  soft-tissue]
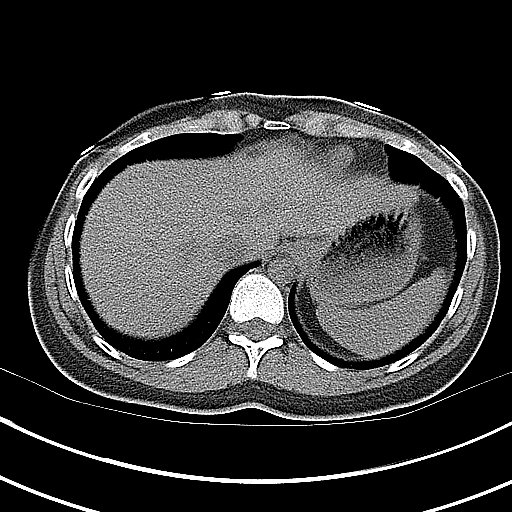
[im 9/18  lung]
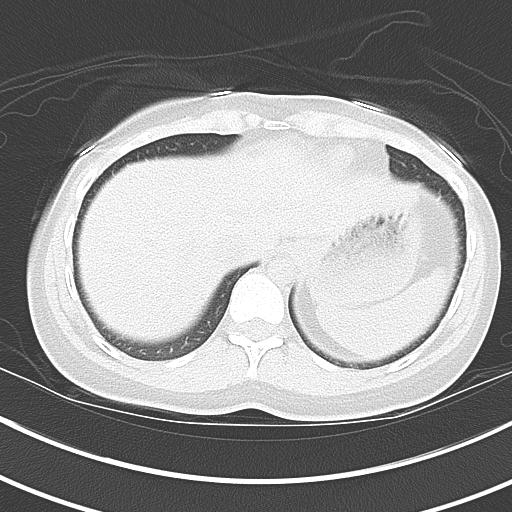
[im 13/18  soft-tissue]
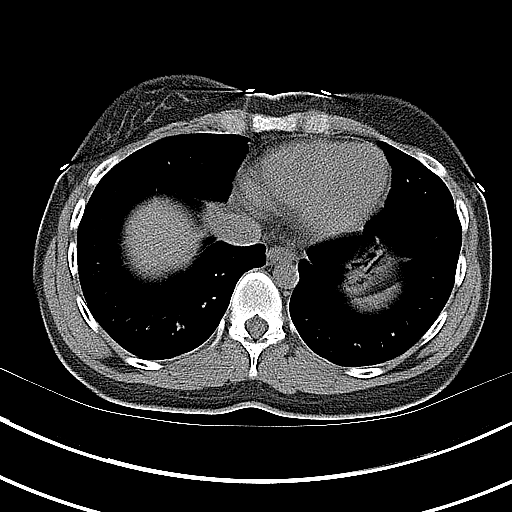
[im 13/18  lung]
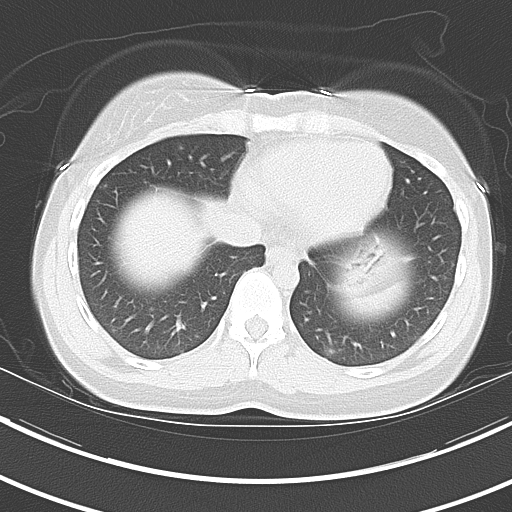

[Series 5: coronal · coronal · 0.67mm/px · 3 of 76 slices shown, 4 images]
[im 26/76  soft-tissue]
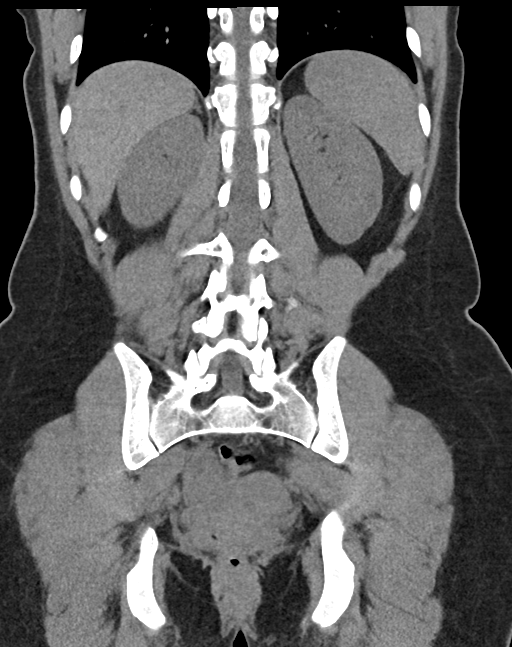
[im 34/76  soft-tissue]
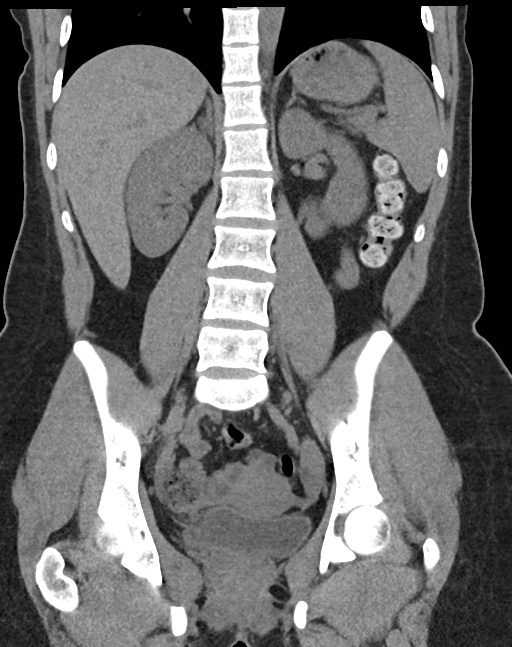
[im 34/76  bone]
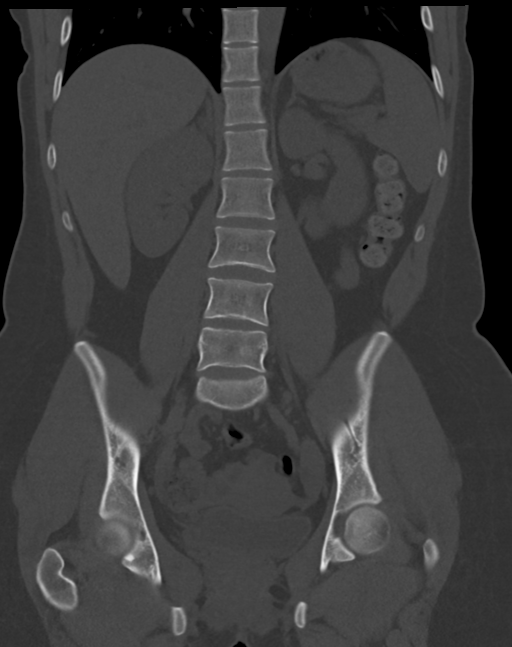
[im 42/76  soft-tissue]
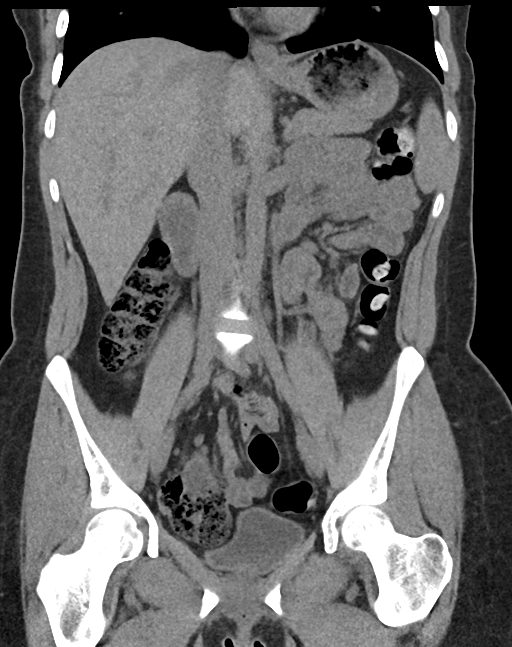

[Series 6: sagittal · sagittal · 0.45mm/px · 1 of 115 slices shown, 2 images]
[im 39/115  soft-tissue]
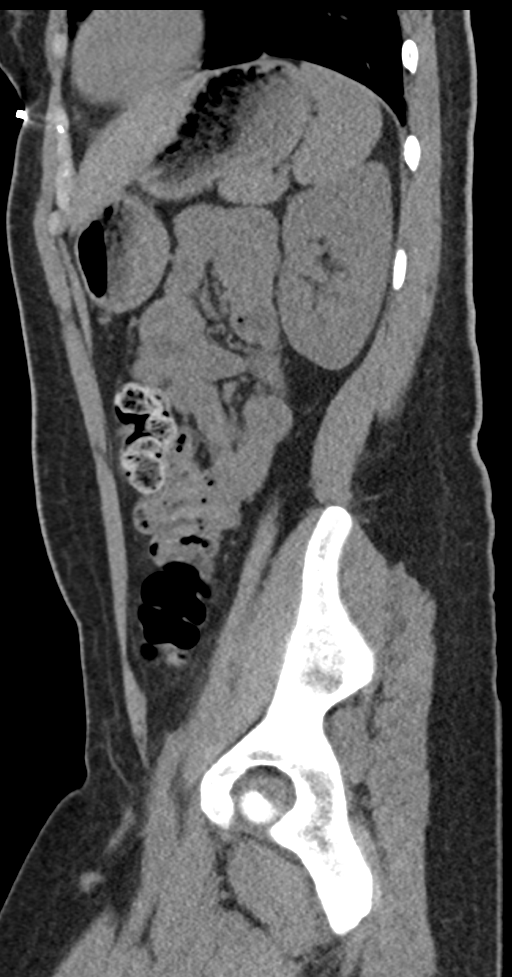
[im 39/115  bone]
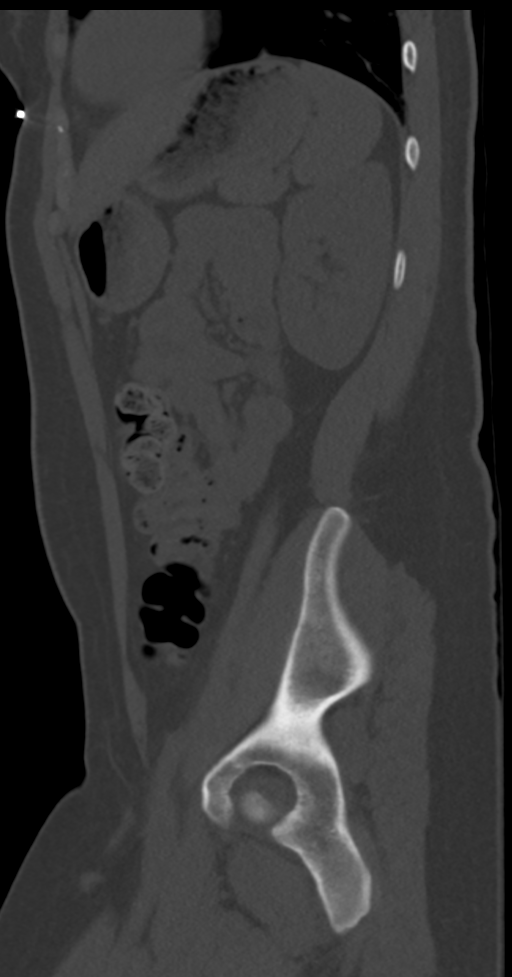

[7 of 46 positions shown; findings below may reference images not displayed]

FINDINGS: Lower chest: No acute abnormality.

Hepatobiliary: No focal liver abnormality is seen. No gallstones,
gallbladder wall thickening, or biliary dilatation.

Pancreas: Unremarkable

Spleen: Unremarkable

Adrenals/Urinary Tract: Adrenal glands are unremarkable. Kidneys are
normal, without renal calculi, focal lesion, or hydronephrosis.
Bladder is unremarkable.

Stomach/Bowel: Stomach is within normal limits. Appendix appears
normal. No evidence of bowel wall thickening, distention, or
inflammatory changes. Moderate stool throughout the proximal colon.
No free intraperitoneal gas or fluid.

Vascular/Lymphatic: No significant vascular findings are present. No
enlarged abdominal or pelvic lymph nodes.

Reproductive: Uterus and bilateral adnexa are unremarkable.

Other: Tiny fat containing umbilical hernia. Rectum unremarkable.

Musculoskeletal: No acute bone abnormality. No lytic or blastic bone
lesion identified.
IMPRESSION: No acute intra-abdominal pathology identified. No definite
radiographic explanation for the patient's reported symptoms.

No nephro or urolithiasis.

Moderate stool without evidence of obstruction.

## 2022-02-28 ENCOUNTER — Ambulatory Visit: Admit: 2022-02-28 | Discharge status: Against Medical Advice | Payer: Medicaid Other

## 2022-04-22 ENCOUNTER — Telehealth: Payer: Medicaid Other | Admitting: Nurse Practitioner

## 2022-04-22 DIAGNOSIS — N898 Other specified noninflammatory disorders of vagina: Secondary | ICD-10-CM

## 2022-04-22 NOTE — Progress Notes (Signed)
Virtual Visit Consent   Hannah Walker, you are scheduled for a virtual visit with a [facility] provider today. Just as with appointments in the office, your consent must be obtained to participate. Your consent will be active for this visit and any virtual visit you may have with one of our providers in the next 365 days. If you have a MyChart account, a copy of this consent can be sent to you electronically.  As this is a virtual visit, video technology does not allow for your provider to perform a traditional examination. This may limit your provider's ability to fully assess your condition. If your provider identifies any concerns that need to be evaluated in person or the need to arrange testing (such as labs, EKG, etc.), we will make arrangements to do so. Although advances in technology are sophisticated, we cannot ensure that it will always work on either your end or our end. If the connection with a video visit is poor, the visit may have to be switched to a telephone visit. With either a video or telephone visit, we are not always able to ensure that we have a secure connection.  By engaging in this virtual visit, you consent to the provision of healthcare and authorize for your insurance to be billed (if applicable) for the services provided during this visit. Depending on your insurance coverage, you may receive a charge related to this service.  I need to obtain your verbal consent now. Are you willing to proceed with your visit today? Hannah Walker has provided verbal consent on 04/22/2022 for a virtual visit (video or telephone). Apolonio Schneiders, FNP  Date: 04/22/2022 4:38 PM  Virtual Visit via Video Note   I, Apolonio Schneiders, connected with  Hannah Walker  ([id number], [birth year]) on 04/22/22 at  4:30 PM EST by a video-enabled telemedicine application and verified that I am speaking with the correct person using two identifiers.  Location: Patient: Virtual Visit Location Patient:  Home Provider: Virtual Visit Location Provider: Home Office   I discussed the limitations of evaluation and management by telemedicine and the availability of in person appointments. The patient expressed understanding and agreed to proceed.    History of Present Illness: Hannah Walker is a 22 y.o. who identifies as a female who was assigned female at birth, and is being seen today for vaginal discharge.  She feels that she has been experiencing vaginal discharge and odor that she feels are consistent with yeast infections she has had in the past   She has not been on any antibiotics recently - Most recent treatment for a yeast infection was 2 months ago.  She was given Diflucan at the UC 2 months ago, and then was given Diflucan prior by OB as well (after exam)  She feels that Monistat has made her symptoms worse in the past and she ended up in the ED for reactions related to topical treatments.   She is currently pregnant [redacted] weeks and 4 days  Most recent OB appointment was yesterday  Unable to be seen today  Symptom onset was today     Problems:  Patient Active Problem List   Diagnosis Date Noted   [redacted] weeks gestation of pregnancy 04/12/2021   Bilateral posterior neck pain 06/12/2020   Tired 06/03/2019   History of ovarian cyst 06/03/2019   Nexplanon in place 06/03/2019   LLQ pain 06/03/2019   Irregular bleeding 06/03/2019   Pelvic pain 06/03/2019   Episodic tension-type  headache, not intractable 01/07/2018   Migraine without aura and without status migrainosus, not intractable 07/23/2017   New daily persistent headache 02/27/2017   Concussion without loss of consciousness 02/27/2017    Allergies:  Allergies  Allergen Reactions   Ampicillin Anaphylaxis    Has patient had a PCN reaction causing immediate rash, facial/tongue/throat swelling, SOB or lightheadedness with hypotension: Yes Has patient had a PCN reaction causing severe rash involving mucus membranes or skin  necrosis: No Has patient had a PCN reaction that required hospitalization: No Has patient had a PCN reaction occurring within the last 10 years: Yes If all of the above answers are "NO", then may proceed with Cephalosporin use.    Penicillins Anaphylaxis   Tape Rash    Adhesive tape of any kind    Medications:  Current Outpatient Medications:    acetaminophen (TYLENOL) 500 MG tablet, Take 1,000 mg by mouth every 6 (six) hours as needed., Disp: , Rfl:    ALPRAZolam (XANAX) 1 MG tablet, Take 1 mg by mouth at bedtime as needed for anxiety., Disp: , Rfl:    famotidine (PEPCID) 20 MG tablet, Take 1 tablet (20 mg total) by mouth 2 (two) times daily as needed for heartburn or indigestion., Disp: 60 tablet, Rfl: 2   Iron-Vitamin C (IRON 100/C PO), Take by mouth., Disp: , Rfl:    lamoTRIgine (LAMICTAL) 25 MG tablet, Take 25 mg by mouth daily., Disp: , Rfl:    metroNIDAZOLE (FLAGYL) 500 MG tablet, Take 1 tablet (500 mg total) by mouth 2 (two) times daily., Disp: 14 tablet, Rfl: 0   Prenatal Vit-Fe Fumarate-FA (PREPLUS) 27-1 MG TABS, Take by mouth., Disp: , Rfl:    promethazine (PHENERGAN) 25 MG tablet, Take 1 tablet (25 mg total) by mouth every 6 (six) hours as needed for nausea or vomiting., Disp: 30 tablet, Rfl: 1   sertraline (ZOLOFT) 100 MG tablet, Take 50 mg by mouth daily., Disp: , Rfl:   Observations/Objective: Patient is well-developed, well-nourished in no acute distress.  Resting comfortably  at home.  Head is normocephalic, atraumatic.  No labored breathing.  Speech is clear and coherent with logical content.  Patient is alert and oriented at baseline.    Assessment and Plan: 1. Vaginal discharge Discussed with patient that we do not recommend treatment with Diflucan without exam to assure yeast is present as this is a higher risk medication to use during pregnancy.  Advised exam in person at [facility] or UC to assure treatment is necessary at this time  Patient is agreeable and will  call OB for follow up      Follow Up Instructions: I discussed the assessment and treatment plan with the patient. The patient was provided an opportunity to ask questions and all were answered. The patient agreed with the plan and demonstrated an understanding of the instructions.  A copy of instructions were sent to the patient via MyChart unless otherwise noted below.    The patient was advised to call back or seek an in-person evaluation if the symptoms worsen or if the condition fails to improve as anticipated.  Time:  I spent 7 minutes with the patient via telehealth technology discussing the above problems/concerns.    Apolonio Schneiders, FNP

## 2022-04-23 ENCOUNTER — Telehealth: Payer: Medicaid Other

## 2022-06-01 ENCOUNTER — Other Ambulatory Visit: Payer: Self-pay

## 2022-06-01 ENCOUNTER — Inpatient Hospital Stay (HOSPITAL_COMMUNITY): Payer: Medicaid Other

## 2022-06-01 ENCOUNTER — Encounter (HOSPITAL_COMMUNITY): Payer: Self-pay | Admitting: Obstetrics & Gynecology

## 2022-06-01 ENCOUNTER — Inpatient Hospital Stay (HOSPITAL_COMMUNITY): Payer: Medicaid Other | Admitting: Anesthesiology

## 2022-06-01 ENCOUNTER — Inpatient Hospital Stay (HOSPITAL_COMMUNITY)
Admission: AD | Admit: 2022-06-01 | Discharge: 2022-06-01 | Disposition: A | Discharge status: Home / Self Care | Payer: Medicaid Other | Attending: Obstetrics & Gynecology | Admitting: Obstetrics & Gynecology

## 2022-06-01 ENCOUNTER — Encounter (HOSPITAL_COMMUNITY)
Admission: AD | Disposition: A | Discharge status: Home / Self Care | Payer: Self-pay | Source: Home / Self Care | Attending: Obstetrics & Gynecology

## 2022-06-01 ENCOUNTER — Inpatient Hospital Stay (EMERGENCY_DEPARTMENT_HOSPITAL): Payer: Medicaid Other | Admitting: Anesthesiology

## 2022-06-01 DIAGNOSIS — Z3A00 Weeks of gestation of pregnancy not specified: Secondary | ICD-10-CM | POA: Diagnosis not present

## 2022-06-01 DIAGNOSIS — Z3A39 39 weeks gestation of pregnancy: Secondary | ICD-10-CM

## 2022-06-01 DIAGNOSIS — F418 Other specified anxiety disorders: Secondary | ICD-10-CM | POA: Diagnosis not present

## 2022-06-01 DIAGNOSIS — Z79899 Other long term (current) drug therapy: Secondary | ICD-10-CM | POA: Diagnosis not present

## 2022-06-01 DIAGNOSIS — E282 Polycystic ovarian syndrome: Secondary | ICD-10-CM | POA: Insufficient documentation

## 2022-06-01 DIAGNOSIS — O034 Incomplete spontaneous abortion without complication: Secondary | ICD-10-CM | POA: Diagnosis present

## 2022-06-01 DIAGNOSIS — D649 Anemia, unspecified: Secondary | ICD-10-CM | POA: Insufficient documentation

## 2022-06-01 DIAGNOSIS — Z87891 Personal history of nicotine dependence: Secondary | ICD-10-CM | POA: Insufficient documentation

## 2022-06-01 DIAGNOSIS — Z30433 Encounter for removal and reinsertion of intrauterine contraceptive device: Secondary | ICD-10-CM | POA: Diagnosis not present

## 2022-06-01 DIAGNOSIS — Z30432 Encounter for removal of intrauterine contraceptive device: Secondary | ICD-10-CM | POA: Insufficient documentation

## 2022-06-01 DIAGNOSIS — K219 Gastro-esophageal reflux disease without esophagitis: Secondary | ICD-10-CM | POA: Diagnosis not present

## 2022-06-01 DIAGNOSIS — Z87442 Personal history of urinary calculi: Secondary | ICD-10-CM | POA: Diagnosis not present

## 2022-06-01 HISTORY — PX: DILATION AND CURETTAGE OF UTERUS: SHX78

## 2022-06-01 LAB — TYPE AND SCREEN
ABO/RH(D): O POS
Antibody Screen: NEGATIVE

## 2022-06-01 LAB — CBC
HCT: 33.5 % — ABNORMAL LOW (ref 36.0–46.0)
Hemoglobin: 11.1 g/dL — ABNORMAL LOW (ref 12.0–15.0)
MCH: 30.7 pg (ref 26.0–34.0)
MCHC: 33.1 g/dL (ref 30.0–36.0)
MCV: 92.5 fL (ref 80.0–100.0)
Platelets: 262 10*3/uL (ref 150–400)
RBC: 3.62 MIL/uL — ABNORMAL LOW (ref 3.87–5.11)
RDW: 20.6 % — ABNORMAL HIGH (ref 11.5–15.5)
WBC: 6.6 10*3/uL (ref 4.0–10.5)
nRBC: 0 % (ref 0.0–0.2)

## 2022-06-01 SURGERY — DILATION AND CURETTAGE
Anesthesia: General | Site: Uterus

## 2022-06-01 MED ORDER — FENTANYL CITRATE (PF) 250 MCG/5ML IJ SOLN
INTRAMUSCULAR | Status: AC
  Filled 2022-06-01: qty 5

## 2022-06-01 MED ORDER — METHYLERGONOVINE MALEATE 0.2 MG/ML IJ SOLN
INTRAMUSCULAR | Status: DC | PRN
  Administered 2022-06-01: .2 mg via INTRAMUSCULAR

## 2022-06-01 MED ORDER — ONDANSETRON HCL 4 MG/2ML IJ SOLN
INTRAMUSCULAR | Status: DC | PRN
  Administered 2022-06-01: 4 mg via INTRAVENOUS

## 2022-06-01 MED ORDER — LIDOCAINE 2% (20 MG/ML) 5 ML SYRINGE
INTRAMUSCULAR | Status: DC | PRN
  Administered 2022-06-01: 60 mg via INTRAVENOUS

## 2022-06-01 MED ORDER — SILVER NITRATE-POT NITRATE 75-25 % EX MISC
CUTANEOUS | Status: AC
  Filled 2022-06-01: qty 10

## 2022-06-01 MED ORDER — DOXYCYCLINE HYCLATE 100 MG IV SOLR
200.0000 mg | Freq: Once | INTRAVENOUS | Status: AC
  Administered 2022-06-01: 200 mg via INTRAVENOUS
  Filled 2022-06-01: qty 100

## 2022-06-01 MED ORDER — SILVER NITRATE-POT NITRATE 75-25 % EX MISC
CUTANEOUS | Status: DC | PRN
  Administered 2022-06-01: 1

## 2022-06-01 MED ORDER — LEVONORGESTREL 20 MCG/DAY IU IUD
1.0000 | INTRAUTERINE_SYSTEM | INTRAUTERINE | Status: AC
  Administered 2022-06-01: 1 via INTRAUTERINE
  Filled 2022-06-01: qty 1

## 2022-06-01 MED ORDER — MORPHINE SULFATE (PF) 4 MG/ML IV SOLN: 4.0000 mg | Freq: Once | INTRAVENOUS | Status: DC

## 2022-06-01 MED ORDER — OXYCODONE-ACETAMINOPHEN 5-325 MG PO TABS
2.0000 | ORAL_TABLET | Freq: Once | ORAL | Status: AC
  Administered 2022-06-01: 2 via ORAL
  Filled 2022-06-01: qty 2

## 2022-06-01 MED ORDER — ROCURONIUM BROMIDE 10 MG/ML (PF) SYRINGE
PREFILLED_SYRINGE | INTRAVENOUS | Status: DC | PRN
  Administered 2022-06-01: 30 mg via INTRAVENOUS

## 2022-06-01 MED ORDER — PHENYLEPHRINE 80 MCG/ML (10ML) SYRINGE FOR IV PUSH (FOR BLOOD PRESSURE SUPPORT)
PREFILLED_SYRINGE | INTRAVENOUS | Status: DC | PRN
  Administered 2022-06-01: 80 ug via INTRAVENOUS

## 2022-06-01 MED ORDER — LACTATED RINGERS IV BOLUS
1000.0000 mL | Freq: Once | INTRAVENOUS | Status: AC
  Administered 2022-06-01: 1000 mL via INTRAVENOUS

## 2022-06-01 MED ORDER — DEXAMETHASONE SODIUM PHOSPHATE 10 MG/ML IJ SOLN
INTRAMUSCULAR | Status: DC | PRN
  Administered 2022-06-01: 4 mg via INTRAVENOUS

## 2022-06-01 MED ORDER — SUGAMMADEX SODIUM 200 MG/2ML IV SOLN
INTRAVENOUS | Status: DC | PRN
  Administered 2022-06-01: 130 mg via INTRAVENOUS

## 2022-06-01 MED ORDER — ONDANSETRON 4 MG PO TBDP
8.0000 mg | ORAL_TABLET | Freq: Once | ORAL | Status: AC
  Administered 2022-06-01: 8 mg via ORAL
  Filled 2022-06-01: qty 2

## 2022-06-01 MED ORDER — OXYCODONE HCL 5 MG/5ML PO SOLN: 5.0000 mg | Freq: Once | ORAL | Status: DC | PRN

## 2022-06-01 MED ORDER — PROMETHAZINE HCL 25 MG/ML IJ SOLN
6.2500 mg | INTRAMUSCULAR | Status: DC | PRN
  Administered 2022-06-01: 12.5 mg via INTRAVENOUS

## 2022-06-01 MED ORDER — PROMETHAZINE HCL 25 MG/ML IJ SOLN
INTRAMUSCULAR | Status: AC
  Filled 2022-06-01: qty 1

## 2022-06-01 MED ORDER — IBUPROFEN 600 MG PO TABS: 600.0000 mg | ORAL_TABLET | Freq: Four times a day (QID) | ORAL | 0 refills | Status: DC | PRN

## 2022-06-01 MED ORDER — 0.9 % SODIUM CHLORIDE (POUR BTL) OPTIME
TOPICAL | Status: DC | PRN
  Administered 2022-06-01: 1000 mL

## 2022-06-01 MED ORDER — METHYLERGONOVINE MALEATE 0.2 MG/ML IJ SOLN
INTRAMUSCULAR | Status: AC
  Filled 2022-06-01: qty 1

## 2022-06-01 MED ORDER — FENTANYL CITRATE (PF) 100 MCG/2ML IJ SOLN
25.0000 ug | INTRAMUSCULAR | Status: DC | PRN
  Administered 2022-06-01: 50 ug via INTRAVENOUS
  Administered 2022-06-01: 100 ug via INTRAVENOUS

## 2022-06-01 MED ORDER — MIDAZOLAM HCL 2 MG/2ML IJ SOLN
INTRAMUSCULAR | Status: AC
  Filled 2022-06-01: qty 2

## 2022-06-01 MED ORDER — PROPOFOL 10 MG/ML IV BOLUS
INTRAVENOUS | Status: DC | PRN
  Administered 2022-06-01: 100 mg via INTRAVENOUS

## 2022-06-01 MED ORDER — OXYCODONE HCL 5 MG PO TABS: 5.0000 mg | ORAL_TABLET | Freq: Once | ORAL | Status: DC | PRN

## 2022-06-01 SURGICAL SUPPLY — 19 items
CATH ROBINSON RED A/P 16FR (CATHETERS) ×1 IMPLANT
CNTNR URN SCR LID CUP LEK RST (MISCELLANEOUS) ×1 IMPLANT
CONT SPEC 4OZ STRL OR WHT (MISCELLANEOUS) ×1
GLOVE BIO SURGEON STRL SZ 6.5 (GLOVE) ×1 IMPLANT
GLOVE BIOGEL PI IND STRL 7.0 (GLOVE) ×2 IMPLANT
GOWN STRL REUS W/ TWL LRG LVL3 (GOWN DISPOSABLE) ×2 IMPLANT
GOWN STRL REUS W/TWL LRG LVL3 (GOWN DISPOSABLE) ×2
KIT BERKELEY 1ST TRIMESTER 3/8 (MISCELLANEOUS) ×1 IMPLANT
NS IRRIG 1000ML POUR BTL (IV SOLUTION) ×1 IMPLANT
PACK VAGINAL MINOR WOMEN LF (CUSTOM PROCEDURE TRAY) ×1 IMPLANT
PAD OB MATERNITY 4.3X12.25 (PERSONAL CARE ITEMS) ×1 IMPLANT
SET BERKELEY SUCTION TUBING (SUCTIONS) ×1 IMPLANT
SPIKE FLUID TRANSFER (MISCELLANEOUS) ×1 IMPLANT
TOWEL GREEN STERILE FF (TOWEL DISPOSABLE) ×2 IMPLANT
UNDERPAD 30X36 HEAVY ABSORB (UNDERPADS AND DIAPERS) ×1 IMPLANT
VACURETTE 10 RIGID CVD (CANNULA) IMPLANT
VACURETTE 7MM CVD STRL WRAP (CANNULA) IMPLANT
VACURETTE 8 RIGID CVD (CANNULA) IMPLANT
VACURETTE 9 RIGID CVD (CANNULA) IMPLANT

## 2022-06-01 NOTE — Anesthesia Postprocedure Evaluation (Signed)
Anesthesia Post Note  Patient: Hannah Walker  Procedure(s) Performed: DILATATION AND EVACUATION WITH ULTRASOUND GUIDANCE, REMOVAL OF INTRAUTERINE DEVICE AND INSERTION OF INTRAUTERINE DEVICE (Uterus)     Patient location during evaluation: PACU Anesthesia Type: General Level of consciousness: awake Pain management: pain level controlled Vital Signs Assessment: post-procedure vital signs reviewed and stable Respiratory status: spontaneous breathing, nonlabored ventilation and respiratory function stable Cardiovascular status: blood pressure returned to baseline and stable Postop Assessment: no apparent nausea or vomiting Anesthetic complications: no   No notable events documented.  Last Vitals:  Vitals:   06/01/22 0815 06/01/22 0830  BP: 107/73 106/60  Pulse: 66 75  Resp: 12 12  Temp:  36.6 C  SpO2: 98% 96%    Last Pain:  Vitals:   06/01/22 0815  TempSrc:   PainSc: Asleep                 Keegan Bensch P Kasey Hansell

## 2022-06-01 NOTE — MAU Note (Addendum)
Pt says she del C/S on 04-29-2022 She delivered at [facility] VB- started  today - 12 Noon  Pad on in Triage- heavy VB Daughter in [place]  Has IUD in place  Cramping started with VB

## 2022-06-01 NOTE — MAU Note (Signed)
Marge from [facility] OR called and said we can take the patient to recovery room. Call and give report to CRNA [phone]

## 2022-06-01 NOTE — Op Note (Signed)
Preoperative diagnosis: Retained products of conception, IUD replacement (removal and insertion) completed under US guidance  Postoperative diagnosis: Same  Anesthesia: General  Procedure: Dilatation and evacuation  Surgeon: Dr. Janyth Pupa  Estimated blood loss: 75cc UOP: 50cc IVF: 700cc  Procedure:  Pt was taken to the OR where general anesthesia was performed.  She was placed in lithotomy position.  The IUD was already noted to be in the vaginal vault as strings were haning past the introitus.  The device was grasped and removed in its entirety.  She was prepped and draped in a sterile fashion.  The bladder was drained using a red rubber.  A weighted speculum is inserted in the vagina and the anterior lip of the cervix was grasped with a tenaculum forcep. The uterus was then sounded at 9 cm. The cervix already appeared dilated as she was noted to be actively bleeding.  Under US guidance using a #9 curved cannula, we proceed with evacuation of products of conception without difficulty.  Complete removal was confirmed by [place].  She noted was to have some brisk bleeding- uterine massage and IM Methergine was given.  Bleeding significantly improved.  Mirena was then inserted under US guidance and proper positioning confirmed.  Instruments were removed, silver nitrazine was placed at the tenaculum site.  Hemostasis was noted. Instrument and sponge count is complete x2.  The procedure is was well tolerated by the patient is taken to recovery room in a well and stable condition.  Specimen: Products of conception sent to pathology  Janyth Pupa, DO Attending [place], [place] for [facility], [place]

## 2022-06-01 NOTE — Anesthesia Preprocedure Evaluation (Addendum)
Anesthesia Evaluation  Patient identified by MRN, date of birth, ID band Patient awake    Reviewed: Allergy & Precautions, NPO status , Patient's Chart, lab work & pertinent test results  History of Anesthesia Complications Negative for: history of anesthetic complications  Airway Mallampati: II  TM Distance: >3 FB Neck ROM: Full    Dental  (+) Dental Advisory Given, Teeth Intact   Pulmonary former smoker   Pulmonary exam normal        Cardiovascular negative cardio ROS Normal cardiovascular exam     Neuro/Psych  Headaches PSYCHIATRIC DISORDERS Anxiety Depression       GI/Hepatic Neg liver ROS,GERD  Medicated and Controlled,,  Endo/Other  negative endocrine ROS    Renal/GU  Kidney stones, recurrent UTI      Musculoskeletal negative musculoskeletal ROS (+)    Abdominal   Peds  Hematology  (+) Blood dyscrasia, anemia   Anesthesia Other Findings   Reproductive/Obstetrics (+) Breast feeding   PCOS Retained POC                              Anesthesia Physical Anesthesia Plan  ASA: 2  Anesthesia Plan: General   Post-op Pain Management: Toradol IV (intra-op)* and Tylenol PO (pre-op)*   Induction: Intravenous  PONV Risk Score and Plan: 3 and Treatment may vary due to age or medical condition, Ondansetron and Dexamethasone  Airway Management Planned: LMA  Additional Equipment: None  Intra-op Plan:   Post-operative Plan: Extubation in OR  Informed Consent: I have reviewed the patients History and Physical, chart, labs and discussed the procedure including the risks, benefits and alternatives for the proposed anesthesia with the patient or authorized representative who has indicated his/her understanding and acceptance.     Dental advisory given  Plan Discussed with: CRNA and Anesthesiologist  Anesthesia Plan Comments:         Anesthesia Quick Evaluation

## 2022-06-01 NOTE — Anesthesia Procedure Notes (Signed)
Procedure Name: Intubation Date/Time: 06/01/2022 7:01 AM  Performed by: Sammie Bench, CRNAPre-anesthesia Checklist: Patient identified, Emergency Drugs available, Suction available and Patient being monitored Patient Re-evaluated:Patient Re-evaluated prior to induction Oxygen Delivery Method: Circle System Utilized Preoxygenation: Pre-oxygenation with 100% oxygen Induction Type: IV induction Ventilation: Mask ventilation without difficulty Laryngoscope Size: Mac and 3 Grade View: Grade I Tube type: Oral Tube size: 7.0 mm Number of attempts: 1 Airway Equipment and Method: Stylet and Oral airway Placement Confirmation: ETT inserted through vocal cords under direct vision, positive ETCO2 and breath sounds checked- equal and bilateral Tube secured with: Tape Dental Injury: Teeth and Oropharynx as per pre-operative assessment

## 2022-06-01 NOTE — Transfer of Care (Signed)
Immediate Anesthesia Transfer of Care Note  Patient: Hannah Walker  Procedure(s) Performed: DILATATION AND EVACUATION,  Patient Location: PACU  Anesthesia Type:General  Level of Consciousness: awake, alert , oriented, and patient cooperative  Airway & Oxygen Therapy: Patient Spontanous Breathing  Post-op Assessment: Report given to RN and Post -op Vital signs reviewed and stable  Post vital signs: Reviewed and stable  Last Vitals:  Vitals Value Taken Time  BP 123/76 06/01/22 0745  Temp    Pulse 96 06/01/22 0749  Resp 13 06/01/22 0749  SpO2 96 % 06/01/22 0749  Vitals shown include unvalidated device data.  Last Pain:  Vitals:   06/01/22 0628  TempSrc: Oral  PainSc: 6          Complications: No notable events documented.

## 2022-06-01 NOTE — H&P (Signed)
Hannah Walker is an 22 y.o. female who presented for vaginal bleeding.  She reports bleeding has been ongoing since delivery Jan 29th, but improved before worsening today at noon.  She states the bleeding is so heavy that it saturated through her clothing.  She reports having an IUD that was placed after her C/S, but is unsure of what type. Patient was evaluated in MAU and diagnosis of retained products made.  Patient reports last intake at 1900.  Pertinent Gynecological History: Menses:  Postpartum State Bleeding: dysfunctional uterine bleeding Contraception: IUD DES exposure: denies Blood transfusions: none Sexually transmitted diseases: no past history Previous GYN Procedures:  None   Last mammogram:  N/A  Date: N/A Last pap:  Unknown  Date:  OB History: G5, P1132   Menstrual History: Menarche age:  No LMP recorded.    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   Hyperemesis gravidarum 12/21/2020   PCO (polycystic ovaries)    Renal disorder    kidney stones   Tachycardia    Urine retention    Vaginal Pap smear, abnormal     Past Surgical History:  Procedure Laterality Date   DILATION AND EVACUATION N/A 04/13/2021   Procedure: DILATATION AND EVACUATION;  Surgeon: Rowland Lathe, MD;  Location: [facility];  Service: Gynecology;  Laterality: N/A;   TENDON REPAIR Right 05/15/2018   Procedure: EXTENSOR TENDON REPAIR RIGHT THUMB;  Surgeon: Dayna Barker, MD;  Location: [place];  Service: Plastics;  Laterality: Right;    Family History  Problem Relation Age of Onset   Healthy Mother    Thyroid disease Mother    Healthy Father    Thyroid disease Maternal Grandmother     Social History:  reports that she quit smoking about 22 months ago. Her smoking use included e-cigarettes. She has never used smokeless tobacco. She reports that she does not drink alcohol and does not use drugs.  Allergies:  Allergies   Allergen Reactions   Ampicillin Anaphylaxis    Has patient had a PCN reaction causing immediate rash, facial/tongue/throat swelling, SOB or lightheadedness with hypotension: Yes Has patient had a PCN reaction causing severe rash involving mucus membranes or skin necrosis: No Has patient had a PCN reaction that required hospitalization: No Has patient had a PCN reaction occurring within the last 10 years: Yes If all of the above answers are "NO", then may proceed with Cephalosporin use.    Penicillins Anaphylaxis   Tape Rash    Adhesive tape of any kind     Medications Prior to Admission  Medication Sig Dispense Refill Last Dose   acetaminophen (TYLENOL) 500 MG tablet Take 1,000 mg by mouth every 6 (six) hours as needed.   Past Week at 1500   cholecalciferol (VITAMIN D3) 25 MCG (1000 UNIT) tablet Take 1,000 Units by mouth daily.   05/31/2022   Iron-Vitamin C (IRON 100/C PO) Take by mouth.   05/31/2022   Prenatal Vit-Fe Fumarate-FA (PREPLUS) 27-1 MG TABS Take by mouth.   05/31/2022   pyridOXINE (VITAMIN B6) 50 MG tablet Take 50 mg by mouth daily.   05/31/2022   ALPRAZolam (XANAX) 1 MG tablet Take 1 mg by mouth at bedtime as needed for anxiety.      famotidine (PEPCID) 20 MG tablet Take 1 tablet (20 mg total) by mouth 2 (two) times daily as needed for heartburn or indigestion. 60 tablet  2    lamoTRIgine (LAMICTAL) 25 MG tablet Take 25 mg by mouth daily.      metroNIDAZOLE (FLAGYL) 500 MG tablet Take 1 tablet (500 mg total) by mouth 2 (two) times daily. 14 tablet 0    promethazine (PHENERGAN) 25 MG tablet Take 1 tablet (25 mg total) by mouth every 6 (six) hours as needed for nausea or vomiting. 30 tablet 1    sertraline (ZOLOFT) 100 MG tablet Take 50 mg by mouth daily.       Review of Systems  Gastrointestinal:  Positive for abdominal pain and nausea. Negative for vomiting.  Genitourinary:  Positive for vaginal bleeding. Negative for difficulty urinating and dysuria.  Neurological:  Negative for  light-headedness and headaches.    Blood pressure 112/75, pulse 76, temperature 97.8 F (36.6 C), temperature source Oral, resp. rate 16, height '5\' 5"'$  (1.651 m), weight 63.5 kg, unknown if currently breastfeeding. Physical Exam Vitals reviewed.  Constitutional:      Appearance: Normal appearance.  HENT:     Head: Normocephalic and atraumatic.  Eyes:     Conjunctiva/sclera: Conjunctivae normal.  Cardiovascular:     Rate and Rhythm: Normal rate.  Pulmonary:     Effort: Pulmonary effort is normal. No respiratory distress.  Genitourinary:    Comments: Speculum Exam: -Normal External Genitalia: Non tender, blood noted at introitus.  -Vaginal Vault: Pink mucosa with good rugae. Copious amt blood and clots in vault. Apricot sized clot removed with ring forceps.  IUD strings noted.  Blood removed with faux swabs x 6. -Cervix:Pink, no lesions, cysts, or polyps.  Appears closed. Scant active bleeding from os with small clots being passed. No IUD stem visualized.  -Bimanual Exam:  Deferred Musculoskeletal:        General: Normal range of motion.     Cervical back: Normal range of motion.  Skin:    General: Skin is warm and dry.  Neurological:     Mental Status: She is alert.  Psychiatric:        Mood and Affect: Mood normal.        Behavior: Behavior normal.     No results found for this or any previous visit (from the past 24 hour(s)).  US PELVIC COMPLETE WITH TRANSVAGINAL  Result Date: 06/01/2022 CLINICAL DATA:  Cramping, bleeding EXAM: TRANSABDOMINAL AND TRANSVAGINAL ULTRASOUND OF PELVIS TECHNIQUE: Both transabdominal and transvaginal ultrasound examinations of the pelvis were performed. Transabdominal technique was performed for global imaging of the pelvis including uterus, ovaries, adnexal regions, and pelvic cul-de-sac. It was necessary to proceed with endovaginal exam following the transabdominal exam to visualize the endometrium. COMPARISON:  None Available. FINDINGS: Uterus  Measurements: 9.9 x 5.6 x 7.7 cm = volume: 221 mL. No fibroids or other mass visualized. Endometrium Thickness: 23 mm in thickness. Heterogeneous appearance with areas of vascularity. IUD in the lower uterine segment. Right ovary Measurements: 3.3 x 2.2 x 2.8 cm = volume: 10.7 mL. Normal appearance/no adnexal mass. Left ovary Measurements: 3.6 x 1.8 x 3.6 cm = volume: 12.3 mL. Normal appearance/no adnexal mass. Other findings No abnormal free fluid. IMPRESSION: Thickened, heterogeneous endometrium. Areas of vascularity within the endometrium. Appearance is concerning for retained products of conception. Electronically Signed   By: Rolm Baptise M.D.   On: 06/01/2022 02:02    Assessment/Plan: Retained Products IUD in Place   -Ultrasound returns as above. -Dr. Kathlen Mody to bedside to discuss options. -Labs and IV ordered. -After discussion with SO, patient desires to proceed with D&C  and reinsertion of Mirena IUD.    Maryann Conners 06/01/2022, 2:23 AM

## 2022-06-01 NOTE — Discharge Instructions (Addendum)
HOME INSTRUCTIONS  Please note any unusual or excessive bleeding, pain, swelling. Mild dizziness or drowsiness are normal for about 24 hours after surgery.   Shower when comfortable  Restrictions: No driving for 24 hours or while taking pain medications.  Activity:  No heavy lifting (> 10 lbs), nothing in vagina (no tampons, douching, or intercourse) x 4 weeks; no tub baths for 4 weeks Vaginal spotting is expected but if your bleeding is heavy, period like,  please call the office   Diet:  You may return to your regular diet.  Do not eat large meals.  Eat small frequent meals throughout the day.  Continue to drink a good amount of water at least 6-8 glasses of water per day, hydration is very important for the healing process.  Pain Management: Take over the counter tylenol or ibuprofen as needed for pain.  You can either take one or alternate between the two medications for pain management.  You may also use a heating pack as needed.    Alcohol -- Avoid for 24 hours and while taking pain medications.  Nausea: Take sips of ginger ale or soda  Fever -- Call physician if temperature over 101 degrees  Follow up:  If you do not already have a follow up appointment scheduled, please call the office at [phone].  If you experience fever (a temperature greater than 100.4), pain unrelieved by pain medication, shortness of breath, swelling of a single leg, or any other symptoms which are concerning to you please the office immediately.

## 2022-06-02 ENCOUNTER — Encounter (HOSPITAL_COMMUNITY): Payer: Self-pay | Admitting: Obstetrics & Gynecology

## 2022-06-02 ENCOUNTER — Other Ambulatory Visit: Payer: Self-pay

## 2022-06-04 LAB — SURGICAL PATHOLOGY

## 2022-07-25 ENCOUNTER — Telehealth: Payer: Medicaid Other | Admitting: Physician Assistant

## 2022-07-25 NOTE — Progress Notes (Signed)
The patient no-showed for appointment despite this provider sending direct link and waiting for at least 10 minutes from appointment time for patient to join. They will be marked as a NS for this appointment/time. This is her second no-show today  Hannah Walker, [place]

## 2022-07-25 NOTE — Progress Notes (Signed)
The patient no-showed for appointment despite this provider sending direct link, reaching out via phone with no response and waiting for at least 10 minutes from appointment time for patient to join. They will be marked as a NS for this appointment/time.  ? ?Hannah Keim Cody Chiquita Heckert, PA-C ? ? ? ?

## 2022-09-22 ENCOUNTER — Other Ambulatory Visit: Payer: Self-pay

## 2022-09-22 ENCOUNTER — Emergency Department (HOSPITAL_COMMUNITY)
Admission: EM | Admit: 2022-09-22 | Discharge: 2022-09-22 | Disposition: A | Discharge status: Home / Self Care | Payer: Medicaid Other | Attending: Emergency Medicine | Admitting: Emergency Medicine

## 2022-09-22 ENCOUNTER — Encounter (HOSPITAL_COMMUNITY): Payer: Self-pay | Admitting: *Deleted

## 2022-09-22 DIAGNOSIS — Y838 Other surgical procedures as the cause of abnormal reaction of the patient, or of later complication, without mention of misadventure at the time of the procedure: Secondary | ICD-10-CM | POA: Diagnosis not present

## 2022-09-22 DIAGNOSIS — N921 Excessive and frequent menstruation with irregular cycle: Secondary | ICD-10-CM

## 2022-09-22 DIAGNOSIS — T8331XA Breakdown (mechanical) of intrauterine contraceptive device, initial encounter: Secondary | ICD-10-CM | POA: Insufficient documentation

## 2022-09-22 LAB — URINALYSIS, ROUTINE W REFLEX MICROSCOPIC
Bilirubin Urine: NEGATIVE
Glucose, UA: NEGATIVE mg/dL
Hgb urine dipstick: NEGATIVE
Ketones, ur: NEGATIVE mg/dL
Leukocytes,Ua: NEGATIVE
Nitrite: NEGATIVE
Protein, ur: NEGATIVE mg/dL
Specific Gravity, Urine: 1.023 (ref 1.005–1.030)
pH: 6 (ref 5.0–8.0)

## 2022-09-22 LAB — CBC
HCT: 38.6 % (ref 36.0–46.0)
Hemoglobin: 12.8 g/dL (ref 12.0–15.0)
MCH: 28.1 pg (ref 26.0–34.0)
MCHC: 33.2 g/dL (ref 30.0–36.0)
MCV: 84.8 fL (ref 80.0–100.0)
Platelets: 306 10*3/uL (ref 150–400)
RBC: 4.55 MIL/uL (ref 3.87–5.11)
RDW: 13.2 % (ref 11.5–15.5)
WBC: 11.1 10*3/uL — ABNORMAL HIGH (ref 4.0–10.5)
nRBC: 0 % (ref 0.0–0.2)

## 2022-09-22 LAB — COMPREHENSIVE METABOLIC PANEL
ALT: 21 U/L (ref 0–44)
AST: 18 U/L (ref 15–41)
Albumin: 3.8 g/dL (ref 3.5–5.0)
Alkaline Phosphatase: 89 U/L (ref 38–126)
Anion gap: 12 (ref 5–15)
BUN: 12 mg/dL (ref 6–20)
CO2: 23 mmol/L (ref 22–32)
Calcium: 10.5 mg/dL — ABNORMAL HIGH (ref 8.9–10.3)
Chloride: 102 mmol/L (ref 98–111)
Creatinine, Ser: 0.68 mg/dL (ref 0.44–1.00)
GFR, Estimated: >60 mL/min (ref >60)
Glucose, Bld: 102 mg/dL — ABNORMAL HIGH (ref 70–99)
Potassium: 3.3 mmol/L — ABNORMAL LOW (ref 3.5–5.1)
Sodium: 137 mmol/L (ref 135–145)
Total Bilirubin: 0.3 mg/dL (ref 0.3–1.2)
Total Protein: 7 g/dL (ref 6.5–8.1)

## 2022-09-22 LAB — WET PREP, GENITAL
Clue Cells Wet Prep HPF POC: NONE SEEN
Sperm: NONE SEEN
Trich, Wet Prep: NONE SEEN
WBC, Wet Prep HPF POC: <10 (ref <10)
Yeast Wet Prep HPF POC: NONE SEEN

## 2022-09-22 LAB — I-STAT BETA HCG BLOOD, ED (MC, WL, AP ONLY): I-stat hCG, quantitative: 10.8 m[IU]/mL — ABNORMAL HIGH (ref <5)

## 2022-09-22 LAB — HCG, QUANTITATIVE, PREGNANCY: hCG, Beta Chain, Quant, S: 2 m[IU]/mL (ref <5)

## 2022-09-22 LAB — LIPASE, BLOOD: Lipase: 23 U/L (ref 11–51)

## 2022-09-22 NOTE — Discharge Instructions (Signed)
Follow-up with your GYN specialist.  Get help right away for any new or worsening conditions

## 2022-09-22 NOTE — ED Provider Notes (Signed)
EMERGENCY DEPARTMENT AT [facility] Provider Note   CSN: [id number] Arrival date & time: 09/22/22  1656     History {Add pertinent medical, surgical, social history, OB history to HPI:1} Chief Complaint  Patient presents with   Abdominal Pain    Hannah Walker is a 22 y.o. female who presents emergency department with a chief complaint of cramping and spotting.  Patient had her second child this past January and has had her first child in January 2023.  Patient states that she has had headache, breast tenderness cramping and spotting which are usually how she presents with her pregnancies and she was very concerned about this.  She has a Mirena IUD in place and to go home present pregnancy test that appeared to be positive so came here for further evaluation.  She denies fevers chills or urinary symptoms   Abdominal Pain      Home Medications Prior to Admission medications   Medication Sig Start Date End Date Taking? Authorizing Provider  acetaminophen (TYLENOL) 500 MG tablet Take 1,000 mg by mouth every 6 (six) hours as needed.    [provider]  ALPRAZolam Prudy Feeler) 1 MG tablet Take 1 mg by mouth at bedtime as needed for anxiety.    [provider]  cholecalciferol (VITAMIN D3) 25 MCG (1000 UNIT) tablet Take 1,000 Units by mouth daily.    [provider]  famotidine (PEPCID) 20 MG tablet Take 1 tablet (20 mg total) by mouth 2 (two) times daily as needed for heartburn or indigestion. 10/18/21   Leftwich-Kirby, Wilmer Floor, CNM  ibuprofen (ADVIL) 600 MG tablet Take 1 tablet (600 mg total) by mouth every 6 (six) hours as needed. 06/01/22   Myna Hidalgo, DO  Iron-Vitamin C (IRON 100/C PO) Take by mouth.    [provider]  lamoTRIgine (LAMICTAL) 25 MG tablet Take 25 mg by mouth daily.    [provider]  metroNIDAZOLE (FLAGYL) 500 MG tablet Take 1 tablet (500 mg total) by mouth 2 (two) times daily. 12/30/21   Calvert Cantor, CNM  Prenatal Vit-Fe Fumarate-FA (PREPLUS) 27-1 MG TABS Take by mouth.    [provider]  promethazine (PHENERGAN) 25 MG tablet Take 1 tablet (25 mg total) by mouth every 6 (six) hours as needed for nausea or vomiting. 12/30/21 02/28/22  Calvert Cantor, CNM  pyridOXINE (VITAMIN B6) 50 MG tablet Take 50 mg by mouth daily.    [provider]  sertraline (ZOLOFT) 100 MG tablet Take 50 mg by mouth daily.    [provider]  [name] 0.25-35 MG-MCG tablet TK 1 T PO D 04/08/18 02/18/19  [provider]  etonogestrel (NEXPLANON) 68 MG IMPL implant Nexplanon 68 mg subdermal implant  Inject 1 implant by subcutaneous route.  11/16/19  [provider]  topiramate (TOPAMAX) 25 MG tablet TAKE 4 TABLETS BY MOUTH AT NIGHT TIME 11/17/18 02/18/19  Deetta Perla, MD      Allergies    Ampicillin, Penicillins, and Tape    Review of Systems   Review of Systems  Gastrointestinal:  Positive for abdominal pain.    Physical Exam Updated Vital Signs BP 111/80 (BP Location: Left Arm)   Pulse 84   Temp 98.1 F (36.7 C)   Resp 16   Ht 5\' 5"  (1.651 m)   Wt 63.5 kg   LMP 08/10/2021   SpO2 98%   BMI 23.30 kg/m  Physical Exam Vitals and nursing note reviewed. Exam  conducted with a chaperone present [employer]).  Constitutional:      General: She is not in acute distress.    Appearance: She is well-developed. She is not diaphoretic.  HENT:     Head: Normocephalic and atraumatic.     Right Ear: External ear normal.     Left Ear: External ear normal.     Nose: Nose normal.     Mouth/Throat:     Mouth: Mucous membranes are moist.  Eyes:     General: No scleral icterus.    Conjunctiva/sclera: Conjunctivae normal.  Cardiovascular:     Rate and Rhythm: Normal rate and regular rhythm.     Heart sounds: Normal heart sounds. No murmur heard.    No friction rub. No gallop.  Pulmonary:     Effort: Pulmonary effort is normal. No respiratory  distress.     Breath sounds: Normal breath sounds.  Abdominal:     General: Bowel sounds are normal. There is no distension.     Palpations: Abdomen is soft. There is no mass.     Tenderness: There is no abdominal tenderness. There is no guarding.  Genitourinary:    Comments: Pelvic exam: normal external genitalia, vulva, vagina, cervix, uterus and adnexa, VULVA: normal appearing vulva with no masses, tenderness or lesions, VAGINA: normal appearing vagina with normal color and discharge, no lesions, CERVIX: cervical motion tenderness absent, multiparous os, IUD strings present and visible, mucoid discharge with scant dark blood, no purulence noted, UTERUS: uterus is normal size, shape, consistency and nontender, ADNEXA: normal adnexa in size, nontender and no masses Musculoskeletal:     Cervical back: Normal range of motion.  Skin:    General: Skin is warm and dry.  Neurological:     Mental Status: She is alert and oriented to person, place, and time.  Psychiatric:        Behavior: Behavior normal.     ED Results / Procedures / Treatments   Labs (all labs ordered are listed, but only abnormal results are displayed) Labs Reviewed  COMPREHENSIVE METABOLIC PANEL - Abnormal; Notable for the following components:      Result Value   Potassium 3.3 (*)    Glucose, Bld 102 (*)    Calcium 10.5 (*)    All other components within normal limits  CBC - Abnormal; Notable for the following components:   WBC 11.1 (*)    All other components within normal limits  I-STAT BETA HCG BLOOD, ED ([facility], [facility], [facility] ONLY) - Abnormal; Notable for the following components:   I-stat hCG, quantitative 10.8 (*)    All other components within normal limits  WET PREP, GENITAL  LIPASE, BLOOD  URINALYSIS, ROUTINE W REFLEX MICROSCOPIC  HCG, QUANTITATIVE, PREGNANCY  GC/CHLAMYDIA PROBE AMP ([facility]) NOT AT [facility]    EKG None  Radiology No results found.  Procedures Procedures  {Document cardiac monitor,  telemetry assessment procedure when appropriate:1}  Medications Ordered in ED Medications - No data to display  ED Course/ Medical Decision Making/ A&P   {   Click here for ABCD2, HEART and other calculatorsREFRESH Note before signing :1}                          Medical Decision Making Amount and/or Complexity of Data Reviewed Labs: ordered.   ***  {Document critical care time when appropriate:1} {Document review of labs and clinical decision tools ie heart score, Chads2Vasc2 etc:1}  {Document your independent review  of radiology images, and any outside records:1} {Document your discussion with family members, caretakers, and with consultants:1} {Document social determinants of health affecting pt's care:1} {Document your decision making why or why not admission, treatments were needed:1} Final Clinical Impression(s) / ED Diagnoses Final diagnoses:  None    Rx / DC Orders ED Discharge Orders     None

## 2022-09-22 NOTE — ED Notes (Signed)
Discharge instructions reviewed with patient. Patient questions answered and opportunity for education reviewed. Patient voices understanding of discharge instructions with no further questions. Patient ambulatory with steady gait to lobby.  

## 2022-09-22 NOTE — ED Triage Notes (Signed)
The pt has an iud that was placed in feb after she delivered her baby in [place] one week ago she began to have abd cramps and since yesterday is now having spotting and the cramps are worse  lmp none since delivery she reports that   she had a pos pregancy test today

## 2022-09-23 LAB — GC/CHLAMYDIA PROBE AMP (~~LOC~~) NOT AT ARMC
Chlamydia: NEGATIVE
Comment: NEGATIVE
Comment: NORMAL
Neisseria Gonorrhea: NEGATIVE

## 2023-02-20 ENCOUNTER — Inpatient Hospital Stay (HOSPITAL_COMMUNITY)
Admission: AD | Admit: 2023-02-20 | Discharge: 2023-02-20 | Discharge status: Against Medical Advice | Payer: Medicaid Other | Attending: Obstetrics and Gynecology | Admitting: Obstetrics and Gynecology

## 2023-02-20 ENCOUNTER — Other Ambulatory Visit: Payer: Self-pay

## 2023-02-20 DIAGNOSIS — Z5321 Procedure and treatment not carried out due to patient leaving prior to being seen by health care provider: Secondary | ICD-10-CM | POA: Diagnosis present

## 2023-02-20 LAB — HCG, QUANTITATIVE, PREGNANCY: hCG, Beta Chain, Quant, S: <1 m[IU]/mL

## 2023-02-20 LAB — POCT PREGNANCY, URINE: Preg Test, Ur: NEGATIVE

## 2023-02-20 NOTE — MAU Note (Signed)
Hannah Walker is a 22 y.o. at Unknown here in MAU reporting: she's had 2 +HPT (yesterday and today) and is now cramping.  Denies VB.  Reports cycle was supposed to start yesterday. LMP: 01/19/2023 Onset of complaint: today Pain score: 5 Vitals:   02/20/23 1027  BP: 111/69  Pulse: 78  Resp: 19  Temp: 98.1 F (36.7 C)  SpO2: 98%     FHT:NA Lab orders placed from triage:   UPT

## 2023-02-20 NOTE — Progress Notes (Signed)
Pt left AMA prior to signing AMA form.  Pt informed MAU registration that she had to pick up her kids.

## 2023-04-21 NOTE — Progress Notes (Signed)
 [id number] - AMBULATORY [facility]  - [facility]RGENT C[facility] [place] [address] [place] [address]  History of Present Illness   History given by patient  Subjective Hannah Walker is a 23 y.o. female who presents for Cough (Pt reports cough for the last three days.  States children are also sick and one has PNA.  Reports feels SOB.  Denies fever.) History of Present Illness The patient is a 23 year old female who presents for evaluation of cough, wheezing, shortness of breath, and diarrhea. She is accompanied by her best friend.  She began experiencing symptoms on Friday morning, which have progressively worsened. She reports a significant drop in her oxygen saturation levels to the low 90s last night, accompanied by a sensation of impending syncope and a heart rate of 130. Her current oxygen saturation levels have returned to normal. She has been experiencing shortness of breath and severe wheezing at night, along with a painful cough. She has no history of asthma but does have a history of seasonal allergies and eczema. She has not noticed any fevers, chills, or night sweats, but reports  body aches. She occasionally vapes and does not possess an albuterol inhaler. She has been self-medicating with over-the-counter cough syrup and took metoprolol last night due to her elevated heart rate. She took Tylenol  this morning for a headache.  She has been experiencing watery diarrhea for the past week, with 3 to 4 episodes daily, primarily postprandial. She is uncertain of the cause of her diarrhea. She has not made any recent dietary changes, traveled recently, or taken antibiotics. She occasionally consumes fatty, greasy, spicy, and sugary foods and drinks. She has been taking Imodium  for her diarrhea.  SOCIAL HISTORY The patient vapes.  ALLERGIES The patient has a history of seasonal allergies and eczema.  MEDICATIONS Current: metoprolol, Tylenol ,  Imodium   Patient has no co-morbidities  or medications that might increase the risk for developing a severe infection   Past Medical History:  Diagnosis Date  . Abnormal Pap smear of cervix   . Anemia   . Chronic kidney disease    recurrent kidney stones  . History of seasonal allergies   . Mental disorder   . PCOS (polycystic ovarian syndrome)   . Postpartum depression   . Stomach problems   . Urinary tract infection     Review of Systems   Review of Systems   Negative except per HPI noted above.   Physicial Exam   Physical Exam Vitals and nursing note reviewed.  Constitutional:      General: She is not in acute distress.    Appearance: Normal appearance. She is not ill-appearing, toxic-appearing or diaphoretic.  HENT:     Head: Normocephalic and atraumatic.  Eyes:     Extraocular Movements: Extraocular movements intact.  Cardiovascular:     Rate and Rhythm: Normal rate and regular rhythm.     Pulses: Normal pulses.     Heart sounds: Normal heart sounds.  Pulmonary:     Effort: Pulmonary effort is normal. No tachypnea, bradypnea, accessory muscle usage, prolonged expiration or respiratory distress.     Breath sounds: Normal breath sounds.  Musculoskeletal:        General: Normal range of motion.     Cervical back: Normal range of motion and neck supple.  Skin:    General: Skin is warm and dry.     Capillary Refill: Capillary refill takes less than 2 seconds.  Neurological:  General: No focal deficit present.     Mental Status: She is alert and oriented to person, place, and time.     Gait: Gait is intact.  Psychiatric:        Mood and Affect: Mood normal.        Behavior: Behavior is cooperative.     Objective Blood pressure 105/67, pulse 87, temperature 98.4 F (36.9 C), temperature source Oral, resp. rate 18, height 1.651 m (5' 5), weight 59 kg (130 lb), SpO2 100%, not currently breastfeeding. Physical Exam There is no redness, inflammation, or  exudates in the throat. There is cobblestoning in the back of the throat.  No facial/sinus pain or tenderness on palpation. The lungs are clear. No abnormal lung sounds, wheezing, rhonchi, or rales detected. The heart has a normal rate and rhythm.  Vital Signs The patient's oxygen saturation is 100 percent. Blood pressure is normal. Pulse rate is 87.  Labs   Recent Results (from the past 48 hours)  POC SARS-COV-2 SYMPTOMATIC (IDNOW)   Collection Time: 04/21/23  8:39 AM  Result Value Ref Range   SARS-COV-2 IDNOW Negative Negative   SARS IDNOW Information      A rapid, molecular diagnostic test on the IDNOW.  Negative results should be treated as presumptive and, if inconsistent with clinical symptoms should be confirmed with an alternative molecular assay.  POC Influenza A&B NAT (IDNOW)   Collection Time: 04/21/23  8:41 AM  Result Value Ref Range   Influenza A RNA Negative Negative   Influenza B RNA Negative Negative     Results Laboratory Studies COVID-19 test: Negative. Influenza test: Negative.   Diagnosis  Medical Decision Making Hannah Walker is a 23 y.o. female who presents to urgent care today with complaints of  Chief Complaint  Patient presents with  . Cough    Pt reports cough for the last three days.  States children are also sick and one has PNA.  Reports feels SOB.  Denies fever.    1. Acute cough (Primary) - POC SARS-COV-2 SYMPTOMATIC (IDNOW) - POC Influenza A&B NAT (IDNOW) - albuterol HFA (PROVENTIL HFA;VENTOLIN HFA;PROAIR HFA) 90 mcg/actuation inhaler; Inhale 2 puffs every 6 (six) hours as needed for wheezing.  Dispense: 1 each; Refill: 0 - benzonatate  (TESSALON ) 100 mg capsule; Take 1 capsule (100 mg total) by mouth 3 (three) times a day as needed for cough.  Dispense: 20 capsule; Refill: 0  2. Shortness of breath - ipratropium-albuteroL (DUO-NEB) 0.5-2.5 mg/3 mL nebulizer solution 3 mL - albuterol HFA (PROVENTIL HFA;VENTOLIN HFA;PROAIR HFA) 90  mcg/actuation inhaler; Inhale 2 puffs every 6 (six) hours as needed for wheezing.  Dispense: 1 each; Refill: 0  3. Suspected COVID-19 virus infection - POC SARS-COV-2 SYMPTOMATIC (IDNOW)   On exam, VS stable and patient is afebrile and in no acute distress. Appears well-hydrated and capillary refill <2 seconds.  Assessment & Plan 1. Cough. Her cough has escalated to cause significant chest discomfort. She reports wheezing, particularly at night, and shortness of breath. She has no history of asthma but does have a history of seasonal allergies and eczema. She has not noticed any fevers, chills, or night sweats, but acknowledges body aches. She occasionally vapes and does not possess an albuterol inhaler. Her daughter was hospitalized on Friday due to a triple viral infection and pneumonia. She suspects a viral etiology for her symptoms, which she believes will resolve spontaneously. She has been self-medicating with over-the-counter cough syrup and took Tylenol  at 6:30 this morning  for a headache. She is advised to abstain from vaping due to its potential to exacerbate respiratory issues. A prescription for tessalon  and an albuterol inhaler will be provided to aid with cough. A DuoNeb treatment was administered during this visit, pt reports improvement in breathing.  She is advised to take an antihistamine such as Zyrtec  daily, Mucinex D if congestion develops, and albuterol inhaler PRN. Tests for influenza and COVID-19 were conducted, results negative.    2. Elevated heart rate. She experienced a heart rate of 130 bpm last night, which has since normalized to 87 bpm. She took metoprolol, which she had previously been prescribed. No new prescription for metoprolol will be given at this time.  3. Diarrhea. She has been experiencing watery diarrhea for the past week, with 3 to 4 episodes daily, predominantly postprandial. She is uncertain of the cause of her diarrhea. She has not made any recent dietary  changes, traveled recently, or taken antibiotics. She occasionally consumes fatty, greasy, spicy, and sugary foods and drinks. She has attempted to manage her diarrhea with Imodium . She is advised to adhere to discontinue imodium , start a bland diet, avoid fatty, greasy, sugary, and spicy foods and drinks, and consume small meals. Over-the-counter Pepto-Bismol and activated charcoal are recommended. She is informed that her stool may temporarily darken due to the use of Pepto-Bismol and activated charcoal. If her diarrhea persists, she will return for further evaluation and potential stool testing.    Discharge Information  Symptomatic management discussed.  Patient was given verbal and written instructions on symptoms that necessitate return to the UC/ED, and instructed to f/u w/ UC or PCP if not improving in expected timeframe.   2. Patient/parent has been instructed on RX/OTC medications, dosages, side effects, and possible interactions as associated with each diagnosis in my impression and plan  above.   3. Patient education (verbal/handout) given on diagnosis, pathophysiology, treatment of diagnosis, side effects of medication use for treatment, restrictions while taking medication, supportives measures such as staying hydrated.   4. Red Flags associated with diagnosis/es were reviewed and patient instructed on action plan if red flags develop. They have been instructed that if symptoms worsen or red flags develop they should return to Urgent Care, go to the nearest ED, or activate EMS/911.     5. Patient and/or parent/guardian (if applicable) agreed with plan and voiced understanding.  No barriers to adherence perceived by myself.  Portions of this note may have been dictated using Dragon dictation software/hardware and may contain grammatical or spelling errors.    If a new prescription was given today, then I discussed potential side effects, drug interactions, instructions for taking the  medication, and the consequences of not taking it.    F/u: Follow up closely with primary care provider (PCP) and other specialists for further care and routine care, but seek medical attention sooner if worsening/concerning signs or symptoms.  Follow up with PCP A portion of this chart was generated utilizing [employer]  Electronically signed by: Elvira Dublin, PA-C 04/21/2023 8:42 AM

## 2023-05-01 NOTE — Progress Notes (Signed)
 Hannah Walker is a 23 y.o. [phone] female who presents to office to discuss birth control options. She reports she had IUD removed 10/2022 due to pelvic pain, irregular spotting and nausea which she ultimately thinks was malpositioned and that was the cause of her symptoms. She has also previously tried OCP's, Depo, Nuvaring, Nexplanon but did not tolerate these methods. She is interested in IUD again as her first IUD did not give her any problems but is also considering Nuvaring. Her cycles are irregular, light and last 3-6 days. She is sexually active but does not use condoms.     Gyn History: Patient's last menstrual period was 04/25/2023. The patient denies history of sexually transmitted disease. Contraception: none Last Papsmear: unknown, no records available in MyChart or Care Everywhere  Patient Active Problem List  Diagnosis  . Seasonal allergic rhinitis due to pollen  . Eczema  . Moderate episode of recurrent major depressive disorder (HCC)  . History of postpartum depression  . History of postpartum hemorrhage  . History of urinary retention  . H/O Pruritus of pregnancy  . H/O cesarean section  . IUD (intrauterine device) in place  . LGSIL on Pap smear of cervix   Past Medical History:  Diagnosis Date  . Abnormal Pap smear of cervix   . Anemia   . Chronic kidney disease    recurrent kidney stones  . History of seasonal allergies   . Mental disorder   . PCOS (polycystic ovarian syndrome)   . Postpartum depression   . Stomach problems   . Urinary tract infection    Past Surgical History:  Procedure Laterality Date  . CESAREAN SECTION, UNSPECIFIED N/A 04/29/2022   Procedure: CESAREAN DELIVERY at 39.2 ks;  Surgeon: Brancazio, Sophia Nicole, MD;  Location: [facility] OB OR;  Service: Obstetrics Daniel;  Laterality: N/A;  . DILATION AND CURETTAGE OF UTERUS     Procedure: DILATION AND CURETTAGE OF UTERUS; postpartum hemorrhage  . TENDON REPAIR Right    Procedure: TENDON  REPAIR; wrist   Family History  Problem Relation Name Age of Onset  . Hypertension Paternal Grandfather    . Hyperlipidemia Paternal Grandfather    . Hypertension Paternal Grandmother    . Diabetes Paternal Grandmother    . Hyperlipidemia Paternal Grandmother    . Hyperlipidemia Maternal Grandmother    . Heart attack Maternal Grandmother    . Allergies Father     Review of Systems:  Negative except as noted in HPI.  BP 95/68 (BP Location: Right arm, Patient Position: Sitting)   Pulse 92   Wt 58.3 kg (128 lb 8 oz)   LMP 04/25/2023   BMI 21.38 kg/m   Physical Exam Vitals and nursing note reviewed. Exam conducted with a chaperone present.  Constitutional:      General: She is not in acute distress. Eyes:     Extraocular Movements: Extraocular movements intact.     Pupils: Pupils are equal, round, and reactive to light.  Cardiovascular:     Rate and Rhythm: Normal rate.  Pulmonary:     Effort: Pulmonary effort is normal.  Abdominal:     Palpations: Abdomen is soft.     Tenderness: There is no abdominal tenderness.  Genitourinary:    Comments: Normal external female genitalia, vaginal walls pink and well-rugated, small amount of dark reddish/brown blood removed with fox swab, cervix smooth, round pink without lesions/masses Musculoskeletal:        General: Normal range of motion.     Cervical  back: Normal range of motion.  Skin:    General: Skin is warm and dry.  Neurological:     General: No focal deficit present.     Mental Status: She is alert and oriented to person, place, and time.  Psychiatric:        Mood and Affect: Mood normal.        Behavior: Behavior normal.    IMPRESSION/PLAN:  1. Cervical cancer screening   2. Screening examination for STI   3. Birth control counseling    - Reviewed R/B/A of IUD vs Nuvaring. Patient has a history of migraines with aura so we discussed that Nuvaring is not the best option. She opts for IUD. Patient will RTO for IUD  placement - UPT negative - Pap smear and GC/CT collected today - Patient instructed to abstain from intercourse or use condoms until IUD can be placed  Orders     Future Labs/Procedures Expected by Expires   Chlamydia / Gonococcus (GC), NAAT  05/01/2023 04/30/2024   Questions:     Release to patient: Immediate   Send message to ordering provider if not resulted within 7 days of expected date:    Pap Smear, Liquid Based  05/01/2023 04/30/2024   Questions:     Source: Cervix   When was the patient's LMP?: 04/25/2023   HPV Testing: Reflex If ASCUS (recommended before age 52)   Gyn High Risk Factors: No known risk factors   Gyn History: No pertinent history   Release to patient: Immediate   Send message to ordering provider if not resulted within 7 days of expected date:        Requested Prescriptions   Signed Prescriptions Disp Refills  . ondansetron  (ZOFRAN -ODT) 4 mg disintegrating tablet 20 tablet 0    Sig: Dissolve 1 tablet (4 mg total) on tongue every 8 (eight) hours as needed for nausea.    Questions encouraged and answered.   RTO for IUD placement.   Pt states understanding and agrees with plan of care.   On the date of this encounter at least 20 minutes were spent on this patients care, including review of historical information, face to face time with the patient, physical examination, clinical documentation and post visit activities  including coordination of patient care.   Danielle Macario Pesa, CNM

## 2023-05-25 NOTE — Progress Notes (Signed)
 Subjective Patient ID: Hannah Walker is a 23 y.o. female.  No chief complaint on file.   The following information was reviewed by members of the visit team:  Tobacco  Allergies  Meds  Problems  Med Hx  Surg Hx  Fam Hx  Soc  Hx     23 yo female presents with complaints of runny nose, congestion, cough, HA, body aches, sore throat since last night. Denies abdominal pain, N/V/D, chest pain, SOB, palpitations. Eating and drinking well. Normal urination. Positive sick contacts, son diagnosed with flu yesterday.     Review of Systems  Constitutional:  Negative for activity change, appetite change, fatigue and fever.  HENT:  Positive for congestion, rhinorrhea and sore throat.   Eyes:  Negative for visual disturbance.  Respiratory:  Positive for cough. Negative for shortness of breath.   Gastrointestinal:  Negative for abdominal pain, diarrhea, nausea and vomiting.  Genitourinary:  Negative for difficulty urinating, dysuria, flank pain and frequency.  Musculoskeletal:  Positive for myalgias. Negative for arthralgias and neck pain.  Skin:  Negative for color change and rash.  Neurological:  Positive for headaches. Negative for dizziness, weakness and light-headedness.  Psychiatric/Behavioral:  Negative for behavioral problems.     Objective Physical Exam Vitals and nursing note reviewed.  Constitutional:      Appearance: Normal appearance.  HENT:     Head: Normocephalic and atraumatic.     Nose: Congestion present. No rhinorrhea.     Mouth/Throat:     Mouth: Mucous membranes are moist. No oral lesions.     Pharynx: Oropharynx is clear. Uvula midline. No pharyngeal swelling, oropharyngeal exudate, posterior oropharyngeal erythema or uvula swelling.     Tonsils: No tonsillar exudate or tonsillar abscesses. 2+ on the right. 2+ on the left.  Eyes:     Extraocular Movements: Extraocular movements intact.     Conjunctiva/sclera: Conjunctivae normal.     Pupils: Pupils  are equal, round, and reactive to light.  Cardiovascular:     Rate and Rhythm: Normal rate and regular rhythm.     Pulses: Normal pulses.          Radial pulses are 2+ on the right side and 2+ on the left side.     Heart sounds: Normal heart sounds.  Pulmonary:     Effort: Pulmonary effort is normal.     Breath sounds: No decreased breath sounds, wheezing or rhonchi.  Abdominal:     General: There is no distension.     Palpations: Abdomen is soft.     Tenderness: There is no abdominal tenderness. There is no guarding or rebound.  Musculoskeletal:        General: Normal range of motion.     Cervical back: Normal range of motion and neck supple. No rigidity or tenderness.  Lymphadenopathy:     Cervical: No cervical adenopathy.  Skin:    General: Skin is warm and dry.  Neurological:     General: No focal deficit present.     Mental Status: She is alert and oriented to person, place, and time.  Psychiatric:        Behavior: Behavior is cooperative.     Assessment/Plan Diagnoses and all orders for this visit:  Influenza A  Other orders -     oseltamivir (TAMIFLU) 75 mg capsule; Take 1 capsule (75 mg total) by mouth 2 (two) times a day for 5 days.   Patient presents ambulatory, A&O, VSS, NAD, nontoxic appearing. Speaking in clear fluent  sentences. Breathing even, regular, unlabored respirations. Physical exam as described. Posterior pharynx clear, without erythema, tonsillar swelling or exudate. No PTA. No cervical adenopathy. Lung sounds clear bilaterally without wheezing or rhonchi. Regular RR, O2 sat 98% RA. Low concern for pneumonia at this time. Appears clinically well hydrated.  Flu A positive. Covid negative.  Results discussed with patient. Tamiflu sent to pharmacy. Viral process discussed. Supportive care discussed. Strict return precautions discussed. Recommend follow up with PCP. Patient agreeable without further questions or concerns.    Electronically  signed: Alan Dorothyann Gaudier, NP 05/25/2023  7:36 PM

## 2023-05-28 NOTE — Progress Notes (Signed)
 [facility] Virtual Visit   Location Information: Patient State (at time of visit): [place]  Patient Location (at time of visit):Home/Other Non-Medical  Provider Location: [place]  Is provider licensed to provide clinical care in the current location/state of the patient? Yes   Consent:  Patient's identity was confirmed. Presenting condition or illness was discussed with the patient/personal representative. Current proposed treatment for presenting condition or illness was explained to patient/personal representative along with the likely benefits and any significant risks or complications associated with the provision of treatment by audio/video means. The patient/personal representative verbally authorized treatment to be provided by audio/video, which may include a limited review of patient's current health status, medication, or other treatment recommendations, patient education, and an opportunity to ask questions about condition and treatment. Verbal Consent Granted by Patient/Personal Representative:Yes   Visit Information: Modality: 2-Way Real-Time Audio/Video  Video Start Time: 9:51 AM Video Stop Time: 0957 Video Total Time: 43m 29s   HPI- Patient presents today to Virtual Visit c/o 5 day h/o sudden onset fever (up to 103), body aches, non productive cough with intermittent SOB, HA, fatigue, clear/runny nasal discharge. Patient denies any history of URI symptoms or sore throat recently. Patient denies any SOB, CP, wheezing, abdominal pain, sinus pain, ear pain, dizziness. Patient has had influenza/other illness exposure(s) including children positive. Patient does not have co-morbidities that increase risk of complications. OTC meds that have been tried include Catering manager. Patient has previously had the flu and this feels the same. Started Tamiflu 3 days ago.  Asking for cough medication.  Physical Exam Constitutional:      General: She is not in acute distress.     Appearance: She is ill-appearing.  HENT:     Right Ear: External ear normal.     Left Ear: External ear normal.     Nose: Congestion and rhinorrhea present.  Eyes:     Extraocular Movements: Extraocular movements intact.     Conjunctiva/sclera: Conjunctivae normal.  Pulmonary:     Effort: Pulmonary effort is normal. No respiratory distress.     Breath sounds: No stridor.     Comments: Able to speak in fluid sentences with no gasping.  No audible wheeze.  Non-productive cough on video. Neurological:     Mental Status: She is alert and oriented to person, place, and time.  Psychiatric:        Mood and Affect: Mood normal.        Behavior: Behavior normal.        Thought Content: Thought content normal.        Judgment: Judgment normal.     Assessment/ Plan Problem List Items Addressed This Visit   None Visit Diagnoses       Influenza A    -  Primary     Shortness of breath       Relevant Medications   predniSONE  (DELTASONE ) 20 mg tablet   albuterol HFA (PROVENTIL HFA;VENTOLIN HFA;PROAIR HFA) 90 mcg/actuation inhaler     Cough in adult       Relevant Medications   benzonatate  (TESSALON ) 200 mg capsule       Symptoms most consistent w/ flu-like illness.   Recommended symptomatic treatment-    1. Rest and stay well hydrated- drink plenty of water .   2. Tylenol  (up to 1000 mg three times a day) or Advil  (up to 800 mg three times a day-- with food) as needed for fever, body aches and/or headache.   3. Relief of nasal  congestion with hot liquids (like tea) or humidified air (from a steamy shower or humidifier).    4. OTC medications: for help thinning out mucus try plain Mucinex; for sinus/nasal congestion try Sudafed; for sore throat try Chloraseptic or Cepacol lozenges or sprays   Monitor symptoms, which should improve in the next 2-3 days. If no improvement after 1 week total of illness or worsening of symptoms (including fever greater than 102.5, chest pain, difficulty  breathing, productive cough with thick yellow/brown sputum, or severe sinus pain lasting greater than 3-4 days), needs to be rechecked by in person provider at urgent care/ER.  ER precautions given.  Out of school/work until fever free for 24 hours off medications.  For questions or concerns regarding this visit, patient should contact Virtual Support at [phone].  Disposition:  Patient to continue care at home.  Electronically signed: Nat VEAR Monica, FNP 05/28/2023  10:01 AM

## 2023-06-19 NOTE — Progress Notes (Signed)
  Insertion/Removal/Reinsertion of Contraceptive Device  Date/Time: 06/19/2023 6:50 PM  Performed by: Katheryn Ivonne Hamilton, CNM Authorized by: Katheryn Ivonne Hamilton, CNM   Consent:    Consent obtained:  Verbal and written   Consent given by:  Patient   Procedural risks discussed:  Bleeding, failure rate, infection and repeat procedure   Patient questions answered: yes     Patient agrees, verbalizes understanding, and wants to proceed: yes     Indication: insertion of non-biodegradable drug delivery implant   Pre-procedure:    Pre-procedure timeout performed: yes     Prepped with: povidone-iodine   The site was cleaned and prepped in a sterile fashion: yes   Procedure: insertion  Procedure Type: intrauterine Medication: 52 mg levonorgestreL  21 mcg/24hr (up to 8 yrs) 52 mg Pelvic exam performed: yes Negative urine pregnancy test: Yes Cervix cleaned and prepped: Yes Speculum placed in vagina: Yes Tenaculum applied to cervix: Yes Uterus sounded: Yes IUD inserted with no complications: Yes Strings trimmed: Yes Post-procedure:  Patient tolerated procedure well: Yes Patient will follow up after next period: Yes

## 2023-06-24 NOTE — Progress Notes (Signed)
 Location Information: Patient State (at time of visit): [place]  Patient Location (at time of visit):Home/Other Non-Medical  Provider Location: [place]  Is provider licensed to provide clinical care in the current location/state of the patient? Yes   Consent:  Patient's identity was confirmed. Presenting condition or illness was discussed with the patient/personal representative. Current proposed treatment for presenting condition or illness was explained to patient/personal representative along with the likely benefits and any significant risks or complications associated with the provision of treatment by audio/video means. The patient/personal representative verbally authorized treatment to be provided by audio/video, which may include a limited review of patient's current health status, medication, or other treatment recommendations, patient education, and an opportunity to ask questions about condition and treatment. Verbal Consent Granted by Patient/Personal Representative:Yes   Visit Information: Modality: 2-Way Real-Time Audio/Video  Video Start Time: 10:31 AM Video Stop Time: 10:39 AM Video Total Time: 101m 31s    HPI- The patient presents to [facility] with vaginal discharge. The discharge is characterized by thick white odorless vaginal discharge along with vaginal and labial itching, irritation, burning. The severity of the symptoms is moderate. The discharge is constant. The discharge has lasted for 2 days.  She denies any UTI symptoms (no dysuria, no urinary frequency, no urinary urgency), denies abdominal pain, denies chills, denies fever, denies flank pain, denies pelvic pain. Patient has not tried any OTC medications. Has a h/o vaginal yeast infections and this feels similar. Denies concern for STD. LMP was 4 week(s) ago and uses IUD for contraception.  Physical Exam Constitutional:      General: She is not in acute distress.    Appearance: She is not ill-appearing or  toxic-appearing.  Eyes:     Conjunctiva/sclera: Conjunctivae normal.  Pulmonary:     Effort: Pulmonary effort is normal.  Neurological:     Mental Status: She is alert.  Psychiatric:        Speech: Speech normal.     Assessment/ Plan Diagnoses and all orders for this visit:  Vaginal candidiasis  Other orders -     fluconazole  (DIFLUCAN ) 150 mg tablet; 1 tablet by mouth once.  May repeat in 3 days if not improved.  Symptoms are consistent with vaginal yeast infection. She is to begin Diflucan .     Here are some things you can try at home:    1. Abstain from sexual intercourse until medication completed and all symptoms resolved.   2. To help prevent future yeast infections:    a. Diabetics should keep tight control over her blood sugar level.   b. Keep genital area clean and dry.   c. Wear loose cotton (rather than [employer]) underwear.   d. Avoid irritating soaps, bubble baths, scented pads and douches.   e. Change tampons and sanitary napkins frequently.   Monitor your symptoms.  If you have not begun to improve after 6 days, or if your symptoms worsen at any time, you will need to see your doctor or go to Urgent Care.    Please seek prompt attention for worsening symptoms: fever, belly pain, vaginal pain, redness or swelling of the labia, discharge with odor.   For questions or concerns regarding this visit, patient should contact Virtual Support at [phone].  Disposition:  Patient to continue care at home.  Electronically signed: Elsie CHRISTELLA Glance, MD 06/24/2023  10:32 AM

## 2023-07-03 DIAGNOSIS — Z20822 Contact with and (suspected) exposure to covid-19: Secondary | ICD-10-CM | POA: Diagnosis not present

## 2023-07-03 DIAGNOSIS — R051 Acute cough: Secondary | ICD-10-CM | POA: Diagnosis not present

## 2023-07-03 DIAGNOSIS — J40 Bronchitis, not specified as acute or chronic: Secondary | ICD-10-CM | POA: Diagnosis not present

## 2023-07-03 DIAGNOSIS — R509 Fever, unspecified: Secondary | ICD-10-CM | POA: Diagnosis not present

## 2023-07-03 NOTE — Progress Notes (Signed)
 Subjective: Patient ID:  Hannah Walker is a 23 y.o. female  Chief Complaint  Patient presents with  . Cough    Productive cough, sore throat, nasal and chest congestion x1-2 wks. Possible fever last night. OTCs not working.  . Sore Throat  . Nasal Congestion    The following information was reviewed by members of the visit team:  Tobacco  Allergies  Meds  Problems  Med Hx  Surg Hx  OB Status   Fam Hx    23 year old female presents for evaluation of symptoms ongoing 1.5 weeks, her son has similar symptoms.  She complains of nasal congestion, sore throat, cough that is productive with yellow sputum, she also developed chills last night.  She reports her chest is hurting from coughing so much. She denies any asthma hx and has been taking OTC medications that are not helping.   She denies any sob.  Cough Associated symptoms include chills and a sore throat. Pertinent negatives include no chest pain, fever, headaches or shortness of breath.  Sore Throat  Associated symptoms include congestion and coughing. Pertinent negatives include no abdominal pain, diarrhea, headaches, shortness of breath or vomiting.     Review of Systems  Constitutional:  Positive for chills. Negative for fever.  HENT:  Positive for congestion and sore throat.   Respiratory:  Positive for cough. Negative for shortness of breath.   Cardiovascular:  Negative for chest pain.  Gastrointestinal:  Negative for abdominal pain, diarrhea, nausea and vomiting.  Genitourinary:  Negative for dysuria.  Neurological:  Negative for dizziness and headaches.      Objective  Vitals:   07/03/23 1119  BP: 126/75  Pulse: 106  Resp: 17  Temp: 99.3 F (37.4 C)  TempSrc: Tympanic  SpO2: 100%  Weight: 62.6 kg (138 lb)    Patient's last menstrual period was 05/23/2023 (exact date).  Physical Exam Vitals and nursing note reviewed.  Constitutional:      Appearance: Normal appearance. She is normal weight.   HENT:     Mouth/Throat:     Mouth: Mucous membranes are moist.     Comments: No drooling trismus or voice change. Cardiovascular:     Rate and Rhythm: Normal rate and regular rhythm.     Heart sounds: Normal heart sounds.  Pulmonary:     Effort: Pulmonary effort is normal.     Breath sounds: Normal breath sounds. No wheezing, rhonchi or rales.  Skin:    General: Skin is warm and dry.     Capillary Refill: Capillary refill takes 2 to 3 seconds.  Neurological:     General: No focal deficit present.     Mental Status: She is alert and oriented to person, place, and time.  Psychiatric:        Mood and Affect: Mood normal.        Behavior: Behavior normal.      Recent Results (from the past 24 hours)  POC SARS-COV-2 SYMPTOMATIC (IDNOW)   Collection Time: 07/03/23 11:32 AM  Result Value Ref Range   SARS-COV-2 IDNOW Negative Negative   SARS IDNOW Information      A rapid, molecular diagnostic test on the IDNOW.  Negative results should be treated as presumptive and, if inconsistent with clinical symptoms should be confirmed with an alternative molecular assay.  POC Influenza A&B NAT (IDNOW)   Collection Time: 07/03/23 11:34 AM  Result Value Ref Range   Influenza A RNA Negative Negative   Influenza B RNA  Negative Negative     Radiologist interpretation:   XR Chest 2 Views  Final Result by Eduard Vikki Guppy, MD (04/03 1238)  XR CHEST 2 VIEWS, 07/03/2023 11:48 AM    INDICATION: cough, Fever, unspecified \ R50.9 Fever, unspecified \ R05.1   Acute cough   cough  COMPARISON: None.    FINDINGS:     Cardiovascular: Cardiac silhouette and pulmonary vasculature are within   normal limits.  Mediastinum: Within normal limits.  Lungs/pleura: Clear. No pneumothorax or pleural effusion.  Upper abdomen: Visualized portions are unremarkable.   Chest wall/osseous structures: Unremarkable.      IMPRESSION:  There is no evidence of acute cardiac or pulmonary abnormality.          Assessment/Plan   Hannah Walker was seen today for cough, sore throat and nasal congestion.  Diagnoses and all orders for this visit:  Bronchitis -     dexAMETHasone  (DECADRON ) 6 mg tablet; Take 1 tablet (6 mg total) by mouth daily with breakfast for 5 days. -     benzonatate  (TESSALON ) 100 mg capsule; Take 1 capsule (100 mg total) by mouth 3 (three) times a day as needed for cough. -     azithromycin  (ZITHROMAX ) 250 mg tablet; Take 2 tabs PO day 1, then 1 tab PO daily for 4 days  Fever, unspecified fever cause -     POC SARS-COV-2 SYMPTOMATIC (IDNOW) -     POC Influenza A&B NAT (IDNOW) -     XR Chest 2 Views  Acute cough -     XR Chest 2 Views    Patient has been instructed on RX/ OTC medications, dosages, side effects, and possible interactions as associated with each diagnosis in my impression and plan above.  2.   Patient education (verbal/handout) given on diagnosis, pathophysiology, treatment of diagnosis, side effects of medication use for treatment, restrictions while taking medication.  Supportive       Care measures as directed on AVS.  Red Flags associated with diagnosis/es were reviewed and patient instructed on action plan if red flags develop.  3.   Urgent Care Disposition:  Follow up with PCP       They have been instructed that if symptoms worse that should go to Urgent Care, go to the nearest ED, or activate EMS.  4.   Patient agreed with plan and voiced understanding.  NO barriers to adherence perceived by myself.  Electronically signed: Rosaline Gerome Skye FNP  Thu 07/03/2023 12:42 PM

## 2023-07-13 ENCOUNTER — Inpatient Hospital Stay (HOSPITAL_COMMUNITY)
Admission: AD | Admit: 2023-07-13 | Discharge: 2023-07-13 | Disposition: A | Discharge status: Home / Self Care | Attending: Family Medicine | Admitting: Family Medicine

## 2023-07-13 ENCOUNTER — Other Ambulatory Visit: Payer: Self-pay

## 2023-07-13 ENCOUNTER — Encounter (HOSPITAL_COMMUNITY): Payer: Self-pay | Admitting: Family Medicine

## 2023-07-13 ENCOUNTER — Inpatient Hospital Stay (HOSPITAL_COMMUNITY)

## 2023-07-13 DIAGNOSIS — Z3201 Encounter for pregnancy test, result positive: Secondary | ICD-10-CM | POA: Diagnosis not present

## 2023-07-13 DIAGNOSIS — Z3689 Encounter for other specified antenatal screening: Secondary | ICD-10-CM | POA: Diagnosis not present

## 2023-07-13 DIAGNOSIS — Z3491 Encounter for supervision of normal pregnancy, unspecified, first trimester: Secondary | ICD-10-CM

## 2023-07-13 DIAGNOSIS — Z3A01 Less than 8 weeks gestation of pregnancy: Secondary | ICD-10-CM | POA: Insufficient documentation

## 2023-07-13 DIAGNOSIS — Z30432 Encounter for removal of intrauterine contraceptive device: Secondary | ICD-10-CM

## 2023-07-13 LAB — CBC
HCT: 37.7 % (ref 36.0–46.0)
Hemoglobin: 13.3 g/dL (ref 12.0–15.0)
MCH: 30.5 pg (ref 26.0–34.0)
MCHC: 35.3 g/dL (ref 30.0–36.0)
MCV: 86.5 fL (ref 80.0–100.0)
Platelets: 289 10*3/uL (ref 150–400)
RBC: 4.36 MIL/uL (ref 3.87–5.11)
RDW: 14.1 % (ref 11.5–15.5)
WBC: 13.6 10*3/uL — ABNORMAL HIGH (ref 4.0–10.5)
nRBC: 0 % (ref 0.0–0.2)

## 2023-07-13 LAB — HCG, QUANTITATIVE, PREGNANCY: hCG, Beta Chain, Quant, S: 35481 m[IU]/mL — ABNORMAL HIGH (ref <5)

## 2023-07-13 LAB — POCT PREGNANCY, URINE: Preg Test, Ur: POSITIVE — AB

## 2023-07-13 MED ORDER — FAMOTIDINE 20 MG PO TABS: 20.0000 mg | ORAL_TABLET | Freq: Two times a day (BID) | ORAL | 1 refills | Status: AC | PRN

## 2023-07-13 MED ORDER — SCOPOLAMINE 1 MG/3DAYS TD PT72: 1.0000 | MEDICATED_PATCH | TRANSDERMAL | 12 refills | Status: DC

## 2023-07-13 MED ORDER — ONDANSETRON HCL 4 MG PO TABS: 8.0000 mg | ORAL_TABLET | Freq: Two times a day (BID) | ORAL | 0 refills | Status: DC

## 2023-07-13 MED ORDER — PRENATAL COMPLETE 14-0.4 MG PO TABS: 1.0000 | ORAL_TABLET | Freq: Every day | ORAL | 3 refills | Status: DC

## 2023-07-13 MED ORDER — ONDANSETRON HCL 4 MG PO TABS: 4.0000 mg | ORAL_TABLET | Freq: Three times a day (TID) | ORAL | 0 refills | Status: DC | PRN

## 2023-07-13 NOTE — [facility] Provider Note (Signed)
 S [name] is a 23 y.o. [id number] patient who presents to [facility] today with complaint of Positive UPT pregnancy tests at home which she took because she was having s/s of pregnancy. She is  s/p IUD placement Mirena on 06/19/23. She denies any VB, LOF but wants the pregnancy confirmed and IUD removed d/t pregnancy. A confirmed positive UPT was performed here   Review of Systems  All other systems reviewed and are negative.  Unless noted in HPI  O BP (!) 123/54 (BP Location: Right Arm)   Pulse (!) 116   Temp 98.4 F (36.9 C) (Oral)   Resp 19   Ht 5\' 5"  (1.651 m)   Wt 65.9 kg   LMP 05/24/2023 (Exact Date)   SpO2 100%   BMI 24.18 kg/m  Physical Exam Vitals and nursing note reviewed. Exam conducted with a chaperone present.  Constitutional:      Appearance: Normal appearance.  HENT:     Head: Normocephalic.     Nose: Nose normal.     Mouth/Throat:     Mouth: Mucous membranes are moist.  Cardiovascular:     Rate and Rhythm: Normal rate and regular rhythm.  Pulmonary:     Effort: Pulmonary effort is normal.     Breath sounds: Normal breath sounds.  Abdominal:     Palpations: Abdomen is soft.  Genitourinary:    General: Normal vulva.     Exam position: Lithotomy position.     Vagina: Normal.     Cervix: Normal. No cervical motion tenderness, discharge, friability, lesion, erythema or cervical bleeding.     Comments: IUD strings visible and removed entire IUD intact  Musculoskeletal:        General: Normal range of motion.     Cervical back: Normal range of motion.  Skin:    General: Skin is warm.  Neurological:     Mental Status: She is alert and oriented to person, place, and time.  Psychiatric:        Mood and Affect: Mood normal.        Behavior: Behavior normal.        Thought Content: Thought content normal.      Procedures   RN Chaperone [place] present during the entire procedure  IUD Removal- D/T Positive Pregnancy Test  Patient identified,  informed consent performed, consent signed.  Patient was in the dorsal lithotomy position, normal external genitalia was noted.  A speculum was placed in the patient's vagina, normal discharge was noted, no lesions. The cervix was visualized, no lesions, no abnormal discharge.  The strings of the IUD were grasped and pulled using ring forceps. The IUD was removed in its entirety. Patient tolerated the procedure well.      Orders Placed This Encounter  Procedures   US  OB Comp Less 14 Wks    Standing Status:   Standing    Number of Occurrences:   1    Symptom/Reason for Exam:   Pregnancy of unknown anatomic location [[id number]]    Release to patient:   Immediate   hCG, quantitative, pregnancy    Standing Status:   Standing    Number of Occurrences:   1   CBC    Standing Status:   Standing    Number of Occurrences:   1   Pregnancy, urine POC    Standing Status:   Standing    Number of Occurrences:   1   Discharge patient Discharge disposition: 01-Home or Self  Care; Discharge patient date: 07/13/2023    Standing Status:   Standing    Number of Occurrences:   1    Discharge disposition:   01-Home or Self Care [1]    Discharge patient date:   07/13/2023      Results for orders placed or performed during the hospital encounter of 07/13/23 (from the past 24 hours)  Pregnancy, urine POC     Status: Abnormal   Collection Time: 07/13/23  8:31 PM  Result Value Ref Range   Preg Test, Ur POSITIVE (A) NEGATIVE  hCG, quantitative, pregnancy     Status: Abnormal   Collection Time: 07/13/23  9:15 PM  Result Value Ref Range   hCG, Beta Chain, Quant, S 35,481 (H) <5 mIU/mL  CBC     Status: Abnormal   Collection Time: 07/13/23  9:15 PM  Result Value Ref Range   WBC 13.6 (H) 4.0 - 10.5 K/uL   RBC 4.36 3.87 - 5.11 MIL/uL   Hemoglobin 13.3 12.0 - 15.0 g/dL   HCT 16.1 09.6 - 04.5 %   MCV 86.5 80.0 - 100.0 fL   MCH 30.5 26.0 - 34.0 pg   MCHC 35.3 30.0 - 36.0 g/dL   RDW 40.9 81.1 - 91.4 %   Platelets  289 150 - 400 K/uL   nRBC 0.0 0.0 - 0.2 %      MDM  HIGH  Confirm Pregnancy: IUP with YS and GS present  Labs: Unremarkable OB Ultrasound for location of pregnancy ( IUP)  Procedure performed  - IUD removal ( See procedure Note)   I have reviewed the patient chart and performed the physical exam . I have ordered & interpreted the lab results and reviewed and interpreted the ultrasound images and performed the IUD removal procedure d/t pregnancy  Medications ordered as stated below.  A/P as described below.  Counseling and education provided and patient agreeable  with plan as described below. Verbalized understanding.    ASSESSMENT Medical screening exam complete   [redacted] weeks gestation of pregnancy - US  confirm IUP  Encounter for IUD removal IUD removed intact and patient tolerated the procedure well  Normal intrauterine pregnancy on prenatal ultrasound in first trimester - IUP     PLAN  Establish Prenatal Care  Discharge from [facility]U in stable condition See AVS for full description of educational information and instructions provided to the patient at time of discharge List of options for follow-up given  Warning signs for worsening condition that would warrant emergency follow-up discussed Patient may return to MAU as needed   Cherlynn Cornfield, NP 07/13/2023 11:54 PM

## 2023-07-13 NOTE — Discharge Instructions (Signed)
 [facility] [employer] for [employer] at [employer] for Women    Phone: [phone]  Center for [employer] at [place]   Phone: [phone]  Center for [employer] at [place]  Phone: [phone]  Center for [employer] at [employer]  Phone: [phone]  [facility] for [employer] at [place]  Phone: [phone]  [facility] at [facility]   Phone: [phone]  [facility]       Phone: [phone]  [facility]    Phone: [phone]   [facility]    Phone: [phone]  [facility]    Phone: [phone]  [facility]    Phone: [phone]  [facility]Family [facility]       Phone: [phone]   [facility]-M[facility]  Phone: [phone]  [facility]    Phone: [phone]  [facility]   Phone: [phone]  [facility]      Phone: [phone]  [facility]    Phone: [phone]    We highly recommend childbirth education to help you plan for labor and begin practicing coping skills (which will be needed with or without pain meds).  [place] Childbirth Education Options: Sign up by visiting [email or web address]  Childbirth ~ Self-Paced eClass (English and Spanish) This online class offers you the freedom to complete a childbirth education series in the comfort of your own home at your own pace.  Childbirth Class (In-Person 4-Week Series  or on Saturdays, Virtual 4-Week Series ~ [place]) This interactive in-person class series will help you and your partner prepare for your birth experience. Topics include: Labor & Birth, Comfort Measures, Breathing Techniques, Massage, Medical Interventions, Pain Management Options, Cesarean Birth, Postpartum Care, and Newborn  Care  Comfort Techniques for Labor ~ In-Person Class [facility]) This interactive class is designed for parents-to-be who want to learn & practice hands-on skills to help relieve some of the discomfort of labor and encourage their babies to rotate toward the best position for birth. Moms and their partners will be able to try a variety of labor positions with birth balls and rebozos as well as practice breathing, relaxation, and visualization techniques.  Natural Childbirth Class (In-Person 5-Week Series, In-Person on Saturdays or Virtual 5-Week Series ~ [place]) This class series is designed for expectant parents who want to learn and practice natural methods of coping with the process of labor and childbirth.  Cesarean Birth Self-Paced eClass (English and Spanish) This online course provides comprehensive information you can trust as you prepare for a possible cesarean birth. In this class, you'll learn how to make your birth and recovery comfortable and joyful through instructive video clips, animations, and activities.  Waterbirth ~ Airline pilot Interested in a waterbirth? In addition to a consultation with your credentialed waterbirth provider, this free, informational online class will help you discover whether waterbirth is the right fit for you. Not all obstetrical practices offer waterbirth, so check with your healthcare provider.  Tour Probation officer) - [facility] [employer] our 4 minute video tour of [facility] located in [place].   [place] Parenting Education Options:  Pregnancy 101 (Virtual) Congratulations on your pregnancy! This class is geared toward moms in their first trimester, but everyone is welcome. We are excited to guide you through all aspects of supporting a healthy pregnancy. You will learn what to  expect at routine prenatal care appointments, common postpartum adjustments, basic infant safety, and  breastfeeding.  Successful Partnering & Parenting ~ In-Person Workshop [facility]) This workshop inspires and equips partners of all economic levels, ages, and cultures to confidently care for their infants, support the birthing persons, and navigate their own transformations into new partners and parents. Learning activities are geared towards supporting partner, but moms are welcome to attend.  'Baby & Me' Parenting Group (Virtual on Wednesdays at 11am) Enjoy this time discussing newborn & infant parenting topics and family adjustment issues with other new parents in a relaxed environment. Each week brings a new speaker or baby-centered activity. This group offers support and connection to parents as they journey through the adjustments and struggles of that sometimes overwhelming first year after the birth of a child.  Baby Safety, CPR, & Choking Class ~ Virtual This life-saving information is meant to encourage parents as they learn important safety and prevention tips as well as infant CPR and relief of choking.  Breastfeeding Class (In-Person in [place] or [employer]) Families learn what to expect in the first days and weeks of breastfeeding your newborn. IF YOU ARE AN EMPLOYEE TAKING THIS CLASS FOR CREDIT, DO NOT register yourself. Please e-mail [email or web address].   Breastfeeding Self-Paced eClass (English & Spanish) Families learn what to expect in the first days and weeks of breastfeeding your newborn.  Caring for Baby ~ In-Person, Virtual or Self-Paced Class This in-person class is for both expectant and adoptive parents who want to learn and practice the most up-to-date newborn care for their babies. Focus is on birth through the first six weeks of life.  CPR & Choking Relief for Infants & Children ~ In-Person Class [facility]) This in-person course is designed for any parent, expectant parent, or adult who cares for infants or children. Participants learn and demonstrate  cardiopulmonary resuscitation and choking relief procedures for both infants and children.  Grandparent Love ~ In-Person Class Grandparents will learn the most updated infant care and safety recommendations. They will discover ways to support their own children during the transition into the parenting role and receive tips on communicating with the new parents.  [place] Parenting Support Group Options:  Bereavement Grief Support Group (Pregnancy/Infant Loss) - Virtual This is an ongoing experience that meets once a month and is designed to help you honor the past, assist you in discovering tools to strengthen you today, and aid you in developing hope for the future.  Breastfeeding & Pumping Support Group (In-Person on Thursdays at 12pm or Virtual on Tuesdays at 5pm) Join us  in-person each Thursday starting June 1st, 2023 at 12pm! This support group is free for all families looking for breastfeeding and/or pumping support.   Community-Based Childbirth Education Options:  [facility] Department Classes:  Childbirth education classes can help you get ready for a positive parenting experience. You can also meet other expectant parents and get free stuff for your baby. Each class runs for five weeks on the same night and costs $45 for the mother-to-be and her support person. Medicaid covers the cost if you are eligible. Call [phone] to register.  [facility] [employer] offers a variety of programs for the [employer] and is another great way to get connected. Please go to [email or web address] for more information.  Childbirth With A Twist! Be informed of your options, get educated on birth, understand what your body is doing, learn how to cope, and have a lot of fun and laughs all while  doing it either from the comfort of your couch OR in our cozy office and classroom space near the [place] airport. If you are taking a virtual class, then class is taught  LIVE, so you can ask questions and receive answers in real-time from an experienced doula and childbirth educator.  This virtual childbirth education class will meet for five instruction times online.  Although we are based in [place], [place], this virtual class is open to anyone in the world. Please visit: [email or web address] for more information.  Books We Love: The Doula Guide to Childbirth by Arvin Laundry and Alisa App The First-Time Parent's Childbirth Handbook by Dr. Andy Keen, CNM The Birth Partner by Coleen Dauer    Commonly Asked Questions During Pregnancy  How Will I Feel When I'm Pregnant? Pregnancy symptoms in the first trimester of pregnancy may not appear until the middle or end of the second month. Hormonal changes will cause tenderness in your breasts, and you may begin to feel more tired than usual. Food cravings, an increase in the need to urinate, and morning sickness may all be more noticeable.  Pregnancy symptoms in the second trimester are more prominent. You may start to feel the baby move and become more active. Dental issues, nasal/sinus problems, and skin irritations can begin to appear. Heartburn, leg cramps, dizziness, and a vaginal discharge are also common. Every woman is different when it comes to the symptoms they experience, and some may not experience any at all. Pregnancy symptoms in the third trimester can include increased frequency in urination, leg cramps, constipation, ligament pain in the abdomen, and weight gain. Back pain and Braxton Hicks contractions will become increasingly more common.  Why is nutrition during pregnancy important? Eating well is one of the best things you can do during pregnancy. Good nutrition helps you handle the extra demands on your body as your pregnancy progresses. The goal is to balance getting enough nutrients to support the growth of your fetus and maintaining a healthy weight.  How much  water should I drink during pregnancy? During pregnancy you should drink 8 to 12 cups (64 to 96 ounces) of water every day. Water has many benefits. It aids digestion and helps form the amniotic fluid around the fetus. Water also helps nutrients circulate in the body and helps waste leave the body.  What can I do to help with nausea? Eat dry toast or crackers in the morning before you get out of bed to avoid moving around on an empty stomach. Eat five or six "mini meals" a day to ensure that your stomach is never empty. Eat frequent bites of foods like nuts, fruits, or crackers.  What can help with constipation during pregnancy? Constipation is common near the end of pregnancy. Eating more foods with fiber can help fight constipation. Fiber is found in fruits, vegetables, whole grains, beans, nuts, and seeds. You should aim for about 25 grams of fiber in your diet each day. Drink a lot of water as you increase your fiber intake.  How much coffee can I drink while I'm pregnant? Research suggests that moderate caffeine consumption (less than 200 milligrams per day) does not cause miscarriage or preterm birth. That's the amount in one 12-ounce cup of coffee. Remember that caffeine also is found in tea, chocolate, energy drinks, and soft drinks. Caffeine can interfere with sleep and contribute to nausea and light-headedness. Caffeine also can increase urination and lead to dehydration.  What can I do to prevent or ease  back pain during pregnancy? There are several things you can do to prevent or ease back pain. For example, wear supportive clothing and shoes. Pay attention to your position when sitting, sleeping, and lifting things. If you need to stand for a long time, rest one foot on a stool or a box to take the strain off your back. You also can use heat or cold to soothe sore muscles.  Is it safe to exercise during pregnancy? If you are healthy and your pregnancy is normal, it is safe to continue  or start regular physical activity. Physical activity does not increase your risk of miscarriage, low birth weight, or early delivery. It's still important to discuss exercise with your ob-gyn provider during your early prenatal visits.   What are the benefits of exercise during pregnancy? Regular exercise during pregnancy benefits you and your fetus in these key ways: Reduces back pain Eases constipation May decrease your risk of gestational diabetes, preeclampsia, and cesarean birth Promotes healthy weight gain during pregnancy Improves your overall fitness and strengthens your heart and blood vessels Helps you to lose the baby weight after your baby is born  Is it safe to dye my hair during pregnancy? Yes, it's safe. Only a small amount of chemicals from hair dye is absorbed through the scalp.  Is it safe to keep a cat during pregnancy? Yes, you can keep your cat. You may have heard that cat feces can carry the infection toxoplasmosis. This infection is only found in cats who go outdoors and hunt prey, such as mice and other rodents. If you do have a cat who goes outdoors or eats prey, have someone else take over daily cleaning the litter box. This will keep you away from any cat feces. If you have an indoor cat who only eats cat food and doesn't have contact with outside animals, your risk of toxoplasmosis is very low.  What substances should I avoid during pregnancy? During pregnancy, women should not use tobacco, alcohol, marijuana, illegal drugs, or prescription medications for nonmedical reasons. Avoiding these substances and getting regular prenatal care are important to having a healthy pregnancy and a healthy baby.   What foods to I need to avoid in pregnancy? To help prevent listeriosis, avoid eating the following foods while you are pregnant: Unpasteurized milk and foods made with unpasteurized milk, including soft cheeses Hot dogs and luncheon meats, unless they are heated until  steaming hot just before serving Unwashed raw produce such as fruits and vegetables  Avoid all raw and undercooked seafood, eggs, meat, and poultry while you are pregnant. Do not eat sushi made with raw fish (cooked sushi is safe). Cooking and pasteurization are the only ways to kill Listeria.  Limit your exposure to mercury by not eating bigeye tuna, king mackerel, marlin, orange roughy, shark, swordfish, or tilefish. Limit eating white (albacore) tuna to 6 ounces a week. You do not have to avoid all fish during pregnancy. In fact, fish and shellfish are nutritious foods with vital nutrients for a pregnant woman and her fetus. Be sure to eat at least 8-12 ounces of low-mercury fish and shellfish per week.  Is travel safe to during pregnancy? In most cases, pregnant women can travel safely until close to their due dates. But travel may not be recommended for women who have pregnancy complications. If you are planning a trip, talk with your (ob-gyn) provider. And no matter how you choose to travel, think ahead about your comfort and safety.  Can I use a sauna or hot tub early in pregnancy? It's best not to. Your core body temperature rises when you use saunas and hot tubs. This rise in temperature can be harmful for your fetus.  Can I get a massage while pregnant? Yes. Massage is a good way to relax and improve circulation. The best position for a massage while you're pregnant is lying on your side, rather than facedown. Some massage tables have a cut-out for the belly, allowing you to lie facedown comfortably. Tell your massage therapist that you're pregnant if you're not showing yet. Many health spas offer special prenatal massages done by therapists who are trained to work on pregnant women.  Is Having Dental Work While Pregnant Safe? Pregnancy and dental work questions are common for expecting moms. Preventive dental cleanings and annual exams during pregnancy are not only safe but are  recommended. The rise in hormone levels during pregnancy causes the gums to swell, bleed, and trap food causing increased irritation to your gums. Preventive dental work while pregnant is essential to avoid oral infections such as gum disease, which has been linked to preterm birth. The American Dental Association (ADA) recommends pregnant women eat a balanced diet, brush their teeth thoroughly with ADA-approved fluoride toothpaste twice a day, and floss daily. Have preventive exams and cleanings during your pregnancy. Let your dentist know you are pregnant. Postpone non-emergency dental work until the second trimester or after delivery, if possible. Elective procedures should be postponed until after the delivery.   Safe Medications in Pregnancy   Acne:  Benzoyl Peroxide  Salicylic Acid   Backache/Headache:  Tylenol: 2 regular strength every 4 hours OR               2 Extra strength every 6 hours   Colds/Coughs/Allergies:  Benadryl (alcohol free) 25 mg every 6 hours as needed  Breath right strips  Claritin  Cepacol throat lozenges  Chloraseptic throat spray  Cold-Eeze- up to three times per day  Cough drops, alcohol free  Flonase (by prescription only)  Guaifenesin  Mucinex  Robitussin DM (plain only, alcohol free)  Saline nasal spray/drops  Sudafed (pseudoephedrine) & Actifed * use only after [redacted] weeks gestation and if you do not have high blood pressure  Tylenol  Vicks Vaporub  Zinc lozenges  Zyrtec   Constipation:  Colace  Ducolax suppositories  Fleet enema  Glycerin suppositories  Metamucil  Milk of magnesia  Miralax  Senokot  Smooth move tea   Diarrhea:  Kaopectate  Imodium A-D   *NO pepto Bismol   Hemorrhoids:  Anusol  Anusol HC  Preparation H  Tucks   Indigestion:  Tums  Maalox  Mylanta  Zantac  Pepcid   Insomnia:  Benadryl (alcohol free) 25mg  every 6 hours as needed  Tylenol PM  Unisom, no Gelcaps   Leg Cramps:  Tums  MagGel    Nausea/Vomiting:  Bonine  Dramamine  Emetrol  Ginger extract  Sea bands  Meclizine  Nausea medication to take during pregnancy:  Unisom (doxylamine succinate 25 mg tablets) Take one tablet daily at bedtime. If symptoms are not adequately controlled, the dose can be increased to a maximum recommended dose of two tablets daily (1/2 tablet in the morning, 1/2 tablet mid-afternoon and one at bedtime).  Vitamin B6 100mg  tablets. Take one tablet twice a day (up to 200 mg per day).   Skin Rashes:  Aveeno products  Benadryl cream or 25mg  every 6 hours as needed  Calamine Lotion  1% cortisone cream   Yeast infection:  Gyne-lotrimin 7  Monistat 7    **If taking multiple medications, please check labels to avoid duplicating the same active ingredients  **take medication as directed on the label  ** Do not exceed 4000 mg of tylenol in 24 hours  **Do not take medications that contain aspirin or ibuprofen

## 2023-07-13 NOTE — MAU Note (Signed)
.  Hannah Walker is a 23 y.o. at Unknown here in MAU reporting: IUD placement on 3/20. Reports not having unprotected sex since placement, but started having pregnancy symptoms 2 weeks ago - nausea and acid reflux. Had 3 positive home pregnancy tests tonight. Denies pain, VB, or abnormal discharge.   LMP: 2/22  Pain score: 0 Vitals:   07/13/23 2025  BP: (!) 123/54  Pulse: (!) 116  Resp: 19  Temp: 98.4 F (36.9 C)  SpO2: 100%     FHT: NA   Lab orders placed from triage: urine pregnancy

## 2023-07-28 DIAGNOSIS — Z3687 Encounter for antenatal screening for uncertain dates: Secondary | ICD-10-CM | POA: Diagnosis not present

## 2023-07-28 DIAGNOSIS — O3680X0 Pregnancy with inconclusive fetal viability, not applicable or unspecified: Secondary | ICD-10-CM | POA: Diagnosis not present

## 2023-07-28 DIAGNOSIS — O09211 Supervision of pregnancy with history of pre-term labor, first trimester: Secondary | ICD-10-CM | POA: Diagnosis not present

## 2023-07-28 DIAGNOSIS — O34219 Maternal care for unspecified type scar from previous cesarean delivery: Secondary | ICD-10-CM | POA: Diagnosis not present

## 2023-07-28 DIAGNOSIS — Z3A08 8 weeks gestation of pregnancy: Secondary | ICD-10-CM | POA: Diagnosis not present

## 2023-07-30 DIAGNOSIS — R300 Dysuria: Secondary | ICD-10-CM | POA: Diagnosis not present

## 2023-07-30 NOTE — Progress Notes (Signed)
 [address] - AMBULATORY [facility]  - URGENT CARE [place] [address] [place] [id number]   History of Present Illness  Patient ID: Hannah Walker is a 23 y.o. female.  History of Present Illness Hannah Walker is a 23 y.o. female who complains of dysuria for 3-4 days. Patient also complains of back pain, urinary frequency, and urinary incontinence. She reports there is occasional trace amount of blood on toilet paper after wiping. Patient denies abdominal pain, chills, diarrhea, headache, vaginal discharge, vaginal itching, and vomiting.  Treatment tried to date: none.     Parts of patient history reviewed include PMH, problem list, medications, allergies, and social history. Past Medical History:  Diagnosis Date  . Abnormal Pap smear of cervix   . Anemia   . Anxiety   . Chronic kidney disease    recurrent kidney stones  . History of seasonal allergies   . IUD (intrauterine device) in place 05/29/2023  . Mental disorder   . PCOS (polycystic ovarian syndrome)   . Postpartum depression   . Stomach problems   . Urinary tract infection     Vitals   Vitals:   07/30/23 1907  BP: 104/75  BP Location: Right arm  Patient Position: Sitting  Pulse: 83  Resp: 18  Temp: 97.9 F (36.6 C)  TempSrc: Oral  SpO2: 100%  Weight: 67.1 kg (148 lb)  Height: 1.651 m (5' 5)     Physicial Exam  Physical Exam Vitals and nursing note reviewed.  Constitutional:      Appearance: Normal appearance. She is normal weight.  HENT:     Head: Normocephalic and atraumatic.  Cardiovascular:     Rate and Rhythm: Normal rate and regular rhythm.  Pulmonary:     Effort: Pulmonary effort is normal.     Breath sounds: Normal breath sounds.  Abdominal:     General: Abdomen is flat.     Palpations: Abdomen is soft.     Tenderness: There is no right CVA tenderness or left CVA tenderness.  Musculoskeletal:        General: Normal  range of motion.  Skin:    General: Skin is warm and dry.     Capillary Refill: Capillary refill takes less than 2 seconds.  Neurological:     General: No focal deficit present.     Mental Status: She is alert and oriented to person, place, and time. Mental status is at baseline.      Labs   Recent Results (from the past 24 hours)  POC Urinalysis Auto without Microscopic   Collection Time: 07/30/23  7:10 PM  Result Value Ref Range   Color, Urine Yellow Yellow   Clarity, Urine Cloudy (A) Clear   Glucose, Urine Negative Negative mg/dL   Bilirubin, Urine Negative Negative   Ketones, Urine Negative Negative mg/dL   Specific Gravity, Urine 1.020 1.010, 1.015, 1.020, 1.025   Blood, Urine Trace-intact (A) Negative   pH, Urine 5.5 5.0, 5.5, 6.0, 6.5, 7.0, 7.5, 8.0   Protein, Urine Negative Negative mg/dL   Urobilinogen, Urine 0.2 <2.0 mg/dL   Nitrite, Urine Negative Negative   Leukocyte Esterase, Urine Trace (A) Negative   Kit/Device Lot # [id number]    Kit/Device Expiration Date 10/13/2023      Imaging   No orders to display    Diagnosis  Hannah Walker was seen today for urinary tract infection and nausea.  Diagnoses and all orders for this  visit:  Dysuria -     POC Urinalysis Auto without Microscopic -     Urine Culture -     nitrofurantoin , macrocrystal-monohydrate, (MACROBID ) 100 mg capsule; Take 1 capsule (100 mg total) by mouth 2 (two) times a day for 5 days.    MDM  Hannah Walker is a 23 y.o. female who is [redacted] weeks pregnant and presented to UC with signs and symptoms concerning for UTI.   Likely UTI. Doubt urolithiasis, pyelonephritis, STD. U/A done (see results above).   Will treat with antibiotics as an outpatient.  Given allergies and antibiotics safe during pregnancy, decision to prescribe Macrobid  x 5 days was done. Patient does not have a complicated UTI, cormorbidities, nor concern for sepsis requiring admission.   Patient to follow-up with PCP and/or  OBGYN. Strict return precautions given in writing and discsussed.   Urgent Care Disposition:  Follow up with PCP and/or OBGYN Discharge Information    If a new prescription was given today, then I discussed potential side effects, drug interactions, instructions for taking the medication, and the consequences of not taking it.     F/u: Follow up closely with primary care provider (PCP) and other specialists for further care and routine care, but seek medical attention sooner if worsening/concerning signs or symptoms. Strict return precautions reviewed with parent(s).   Electronically signed by: Denver Hands, PA-C 07/30/2023 8:27 PM

## 2023-08-01 NOTE — Telephone Encounter (Signed)
 PAL Phone Note  Return phone call to patient. She is about [redacted] weeks pregnant and was calling in tonight with urinary complaints. She reports she just received a phone call from Dr. Marino, who suspected her symptoms were related to kidney stones given negative urine culture on 4/30 and UA with trace blood. Recommended tylenol  and presentation to the ED if pain was significantly worse or she develops other systemic symptoms. All questions previously answered by Dr. Marino. Apologized for the duplicate call.   Tawni Almarie Friends, MD OB/GYN PGY-3

## 2023-08-01 NOTE — Progress Notes (Signed)
 Patient needs to discontinue. Urine culture is negative.

## 2023-08-01 NOTE — Telephone Encounter (Signed)
 Patient called into PAL line complaining of flank pain bilaterally, nausea, and painful urination.  She was recently seen at urgent care and treated for a presumed UTI.  She was started on Macrobid , but was told to discontinue today when her culture came back negative.  She however continues to have symptoms.  Her pain currently is a 4/10 after taking tylenol .  Prior to that it was a 6/10.  She describes it as a constant ache, but with varying waves of intensity.  She denies fever or chills.  Her symptoms sound consistent with a kidney stone, with slight leuk esterase and blood in her urine dip, but a negative urine culture.  We discussed that while kidney stones themselves are uncomfortable, by themselves they are not necessarily life threatening.  However, infection can be a complication - and this could be dangerous.  I recommended continued pain management, and lots of fluid intake.  Return precautions for worsening symptoms or infection reviewed.  She was advised to come to the hospital tonight for worsening symptoms, or tomorrow if symptoms do not improve.  We discussed that passage of the stone would likely resolve symptoms, however not all stones pass spontaneously.  All questions answered.  Patients amenable to plan of care.  Peggye Planas, MD Clinical Instructor Obstetrics and Gynecology Reproductive Endocrinology and Infertility

## 2023-08-04 NOTE — Telephone Encounter (Addendum)
 PAL Eval completed-no further management needed at this time

## 2023-08-04 NOTE — Telephone Encounter (Signed)
 Return call made to patient. Call went to voicemail. Left voicemail message that she may return call to [facility] OBGYN triage nurse at [phone].
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
## 2023-08-11 DIAGNOSIS — O09291 Supervision of pregnancy with other poor reproductive or obstetric history, first trimester: Secondary | ICD-10-CM | POA: Diagnosis not present

## 2023-08-11 DIAGNOSIS — O09211 Supervision of pregnancy with history of pre-term labor, first trimester: Secondary | ICD-10-CM | POA: Diagnosis not present

## 2023-08-11 DIAGNOSIS — Z3A09 9 weeks gestation of pregnancy: Secondary | ICD-10-CM | POA: Diagnosis not present

## 2023-08-11 DIAGNOSIS — Z8751 Personal history of pre-term labor: Secondary | ICD-10-CM | POA: Diagnosis not present

## 2023-08-11 DIAGNOSIS — Z8659 Personal history of other mental and behavioral disorders: Secondary | ICD-10-CM | POA: Diagnosis not present

## 2023-08-11 DIAGNOSIS — O34211 Maternal care for low transverse scar from previous cesarean delivery: Secondary | ICD-10-CM | POA: Diagnosis not present

## 2023-08-11 DIAGNOSIS — Z3689 Encounter for other specified antenatal screening: Secondary | ICD-10-CM | POA: Diagnosis not present

## 2023-08-11 DIAGNOSIS — Z8759 Personal history of other complications of pregnancy, childbirth and the puerperium: Secondary | ICD-10-CM | POA: Diagnosis not present

## 2023-08-11 DIAGNOSIS — O26891 Other specified pregnancy related conditions, first trimester: Secondary | ICD-10-CM | POA: Diagnosis not present

## 2023-08-11 DIAGNOSIS — F331 Major depressive disorder, recurrent, moderate: Secondary | ICD-10-CM | POA: Diagnosis not present

## 2023-08-11 DIAGNOSIS — Z98891 History of uterine scar from previous surgery: Secondary | ICD-10-CM | POA: Diagnosis not present

## 2023-08-11 DIAGNOSIS — Z975 Presence of (intrauterine) contraceptive device: Secondary | ICD-10-CM | POA: Diagnosis not present

## 2023-08-11 DIAGNOSIS — O99341 Other mental disorders complicating pregnancy, first trimester: Secondary | ICD-10-CM | POA: Diagnosis not present

## 2023-08-11 DIAGNOSIS — Z3A10 10 weeks gestation of pregnancy: Secondary | ICD-10-CM | POA: Diagnosis not present

## 2023-08-11 DIAGNOSIS — Z87898 Personal history of other specified conditions: Secondary | ICD-10-CM | POA: Diagnosis not present

## 2023-08-11 DIAGNOSIS — R87612 Low grade squamous intraepithelial lesion on cytologic smear of cervix (LGSIL): Secondary | ICD-10-CM | POA: Diagnosis not present

## 2023-08-11 DIAGNOSIS — R12 Heartburn: Secondary | ICD-10-CM | POA: Diagnosis not present

## 2023-08-11 NOTE — Progress Notes (Signed)
 INITIAL PRENATAL VISIT  Patient Name: [facility] Medical Record Number:  [id number] Date of Birth: 2000/06/16 Date of Service: 08/11/23  Chief Complaint: Here to establish prenatal care  History of Present Illness Hannah Walker is a 23 y.o. H3E8867 at [redacted]w[redacted]d with an EDD of 03/06/2024, by Ultrasound.  Review of Systems A 14-point ROS was performed with pertinent positives/negatives noted in the HPI. The remainder of the ROS are negative.  Patient History OB History  Gravida Para Term Preterm AB Living  6 2 1 1 3 2   SAB IAB Ectopic Molar Multiple Live Births  3 0 0 0  1    # Outcome Date GA Lbr Len/2nd Weight Sex Type Anes PTL Lv  6 Current           5 Preterm 04/29/22 [redacted]w[redacted]d  2.82 kg (6 lb 3.5 oz) F CS-LTranv Spinal  LIV  4 Term 04/13/21 [redacted]w[redacted]d 09:18 / 00:36 3.657 kg (8 lb 1 oz) M Vag-Spont EPI    3 SAB 05/2020          2 SAB 12/2019          1 SAB             Gynecologic History: History of STI: trich History of abnormal cervical cancer screening: Yes - LSIL and HPV pos other  Last cervical cancer screening: 04/2023 NILM (no co-test)  Past Medical History:  Diagnosis Date  . Abnormal Pap smear of cervix   . Anemia   . Anxiety   . Chronic kidney disease    recurrent kidney stones  . History of seasonal allergies   . IUD (intrauterine device) in place 05/29/2023  . Mental disorder   . PCOS (polycystic ovarian syndrome)   . Postpartum depression   . Stomach problems   . Urinary tract infection     Past Surgical History:  Procedure Laterality Date  . CESAREAN SECTION, UNSPECIFIED N/A 04/29/2022   Procedure: CESAREAN DELIVERY at 39.2 ks;  Surgeon: Brancazio, Sophia Nicole, MD;  Location: [facility] OB OR;  Service: Obstetrics Daniel;  Laterality: N/A;  . DILATION AND CURETTAGE OF UTERUS     Procedure: DILATION AND CURETTAGE OF UTERUS; postpartum hemorrhage  . TENDON REPAIR Right    Procedure: TENDON REPAIR; wrist    Social History   Socioeconomic  History  . Marital status: Single    Spouse name: None  . Number of children: None  . Years of education: None  . Highest education level: None  Occupational History  . None  Tobacco Use  . Smoking status: Former  . Smokeless tobacco: Never  Vaping Use  . Vaping status: Former  Substance and Sexual Activity  . Alcohol use: Not Currently  . Drug use: Never  . Sexual activity: None  Other Topics Concern  . None  Social History Narrative   Pets in the home  [place] .     Social Drivers of Health   Food Insecurity: Low Risk  (04/30/2022)   Food vital sign   . Within the past 12 months, you worried that your food would run out before you got money to buy more: Never true   . Within the past 12 months, the food you bought just didn't last and you didn't have money to get more: Not on file  Recent Concern: Food Insecurity - Medium Risk (04/30/2022)   Received from [facility] visits prior to 06/01/2022., [facility] visits prior to 06/01/2022.  Food   . Within the past 12 months, you worried that your food would run out before you got money to buy more food: Never true   . Within the past 12 months, the food you bought just didn't last and you didn't have money to get more: Sometimes true  Transportation Needs: Not on file  Safety: Low Risk  (04/21/2023)   Safety   . How often does anyone, including family and friends, physically hurt you?: Never   . How often does anyone, including family and friends, insult or talk down to you?: Never   . How often does anyone, including family and friends, threaten you with harm?: Never   . How often does anyone, including family and friends, scream or curse at you?: Never  Living Situation: Not on file    Family History  Problem Relation Name Age of Onset  . Hypertension Paternal Grandfather    . Hyperlipidemia Paternal Grandfather    . Lung cancer Paternal Grandfather    . Hypertension Paternal  Grandmother    . Diabetes Paternal Grandmother    . Hyperlipidemia Paternal Grandmother    . Hyperlipidemia Maternal Grandmother    . Heart attack Maternal Grandmother    . Allergies Father    . Hypertension Father    . Bipolar disorder Mother    . Hyperthyroidism Mother      Current Outpatient Medications on File Prior to Visit  Medication Sig Dispense Refill  . benzonatate  (TESSALON ) 100 mg capsule Take 1 capsule (100 mg total) by mouth 3 (three) times a day as needed for cough. (Patient not taking: Reported on 07/30/2023) 30 capsule 0  . famotidine  (PEPCID ) 20 mg tablet Take 20 mg by mouth.    . famotidine  (PEPCID ) 40 mg tablet Take 40 mg by mouth daily.    . ferrous sulfate 325 mg (65 mg iron) tablet Take 325 mg by mouth. (Patient not taking: Reported on 07/30/2023)    . ondansetron  (ZOFRAN ) 4 mg tablet Take 4 mg by mouth every 8 (eight) hours as needed for nausea or vomiting.    . prenatal vitamin (VINATE) 60 mg iron-1 mg tab tablet Take 1 tablet by mouth daily.    . scopolamine  (TRANSDERM-SCOP) 1 mg over 3 days pt3d patch Place 1.5 mg on the skin.     No current facility-administered medications on file prior to visit.    Allergies Ampicillin, Penicillins, Adhesive, and Surgical tape  Physical Examination BP (!) 98/58   Pulse 97   Ht 1.651 m (5' 5)   Wt 68.1 kg (150 lb 3.2 oz)   LMP 05/23/2023 (Exact Date)   BMI 24.99 kg/m  General Appearance No acute distress, well appearing, and well nourished  Pulmonary Normal work of breathing  Cardiovascular Normal rate  Gastrointestinal Abdomen soft, no rebound or guarding  Genitourinary SVE: deferred   Musculoskeletal Normal gait, grossly normal range of motion  Neurologic Alert and oriented to person, place, and time  Psychiatric Integumentary Mood and affect within normal limits Skin is warm, dry, no rash noted  Assessment and Plan Hannah Walker is a 23 y.o. H3E8867 at [redacted]w[redacted]d with a pregnancy complicated by the  following here to initiate prenatal care.   Problem List Items Addressed This Visit     Moderate episode of recurrent major depressive disorder (HCC)   Overview  Resistant to SSRI and complications with trial of SNRI Previously Est. In the perinatal mental health clinic Rx: Lamictal : 12/10/2021 25mg  x2 weeks, then  increase to 50mg  x2 weeks, then increase to 100mg  daily, titrate until therapeutic Lamictal  Education: The risks and benefits of use of the following  psychotropic medications were discussed with the pt: Lamictal ; discussed  importance of titration to prevent SJS - patient educated on symptoms of  SJS and to seek urgent care. Patient is currently bottle feeding. PTSD - Complex Childhood Trauma (likely exacerbating anxious symptoms and  hypervigilance)  History of postpartum depression  Mood stable- no current meds. Referral to [facility] sent.       Current Assessment & Plan  Mood stable- no current meds. Referral to [facility] sent.      History of postpartum depression   Overview  No current meds. Previously seen by PMH w/ prior pregnancy.  Desires referral - sent.       History of postpartum hemorrhage   Overview  - Had to go to OR for D&C for retained placenta        History of urinary retention   Overview  - Her previous pregnancy was complicated by kidney stones and urinary  retention. She had to self cath for 2 months as a result. She was  prescribed Tamsulosin . It was suspected that the medication caused  hypotension and tachycardia. She was evaluated by cardiology and her echo  was normal. She was encouraged to f/u as needed.   Now resolved.       H/O cesarean section   Overview  04/2022 LTCD primary elective Desires repeat C/S.      Current Assessment & Plan  Desires repeat C/S      IUD (intrauterine device) in place   Overview  H/O post-placental IUD 04/2022 (expelled) Mirena  place 05/2022; remove or replace by 05/2030 This pregnancy occurred  with IUD in place - IUD removed upon pos pregnancy test.      LGSIL on Pap smear of cervix   Overview  Cotest q24mo until NILM/HPV negative x 2 05/01/23 Pap NILM  (no co-test)  10/2021 Pap LSIL, HPV-other positive 08/2020 Pap LSIL, HPV-other positive      [redacted] weeks gestation of pregnancy (CMD) - Primary   Overview  Dating: Estimated Date of Delivery: 03/06/24 dated byearly ultrasound Prenatal labs:   PCN allergy - referred for allergy testing on ** Initial labs reviewed by: **  Blood type O positive  Relevant Abnormal Labs **    28 weeks labs reviewed by: **  GTT: **  Hemoglobin: ** 36 week labs reviewed by: **    GBS: ** GC/CT: ** Fetal presentation: ** determined by **   Aneuploidy screening: NIPT pending Carrier Screening: Hemoglobin Electrophoresis, CF, and SMA pending Anatomic survey ultrasound: ** Vaccines:  TDap: ** COVID19: ** Flu: ** RSV: **  Anesthesia Plan: ** Contraception Plan: ** Postpartum Feeding Plan: **  Delivery Plan: repeat C/S       Current Assessment & Plan  Prenatal labs not obtained yet- getting them today. Aneuploidy: desires NIPT - ordered Carrier screening: desires CF and SMA - ordered Dating US  completed . Anatomy U/S scheduled. Reviewed PNV schedule along with recommendations based on medical history.   Recommended hospital tour and CBE/BF classes.  Routine precautions and parameters for triage eval reviewed.       Relevant Orders   Cystic Fibrosis Carrier Screen   Genetics Spinal Muscular Atrophy SMN1 and SMN2   MaterniT21 PLUS Core - Trisomy Screen: 21, 18, and 13, and Fetal sex   H/O premature delivery   Overview  G5 at [redacted]w[redacted]d.  Heartburn   Overview  Stable on Pepcid .      Current Assessment & Plan  Stable on Pepcid .       Follow up: Return in about 4 weeks (around 09/08/2023) for ROB.  Coding for today's visit was reached by Medical Decision Making (MDM).  Grant JAYSON Soja, NP

## 2023-08-11 NOTE — Telephone Encounter (Signed)
-----   Message from Grant Soja, NP sent at 08/11/2023 12:30 PM EDT ----- Regarding: Referral This patient desires referral to PMH. Has been seen by PMH with prior pregnancies. Hx depression. Currently [redacted] weeks pregnant. No current meds.  Thanks!

## 2023-08-13 NOTE — Telephone Encounter (Signed)
 Patient is calling in requesting Diflucan  be sent to the pharmacy for her. Patient stated she reviewed her result from her urine culture but believed she has yeast infection instead. Patient stated she started having some vaginal itching yesterday and the burning is still present. Please call to further advise.

## 2023-08-13 NOTE — Telephone Encounter (Signed)
 [facility] message sent to patient. Suzen Stephane Resides, RN

## 2023-08-19 NOTE — Telephone Encounter (Signed)
 Pt Id'd by name and DOB  Pt accepts Follow up w/ Amy on 7/15 at 8am. Pt prefers to do in person visits, this pregnancy and prefers 8am visits.   Lavetta Gambler, LPN

## 2023-10-06 ENCOUNTER — Inpatient Hospital Stay (HOSPITAL_COMMUNITY)
Admission: AD | Admit: 2023-10-06 | Discharge: 2023-10-06 | Disposition: A | Discharge status: Home / Self Care | Attending: Obstetrics and Gynecology | Admitting: Obstetrics and Gynecology

## 2023-10-06 ENCOUNTER — Inpatient Hospital Stay (HOSPITAL_BASED_OUTPATIENT_CLINIC_OR_DEPARTMENT_OTHER)

## 2023-10-06 ENCOUNTER — Encounter (HOSPITAL_COMMUNITY): Payer: Self-pay | Admitting: Obstetrics and Gynecology

## 2023-10-06 DIAGNOSIS — O4402 Placenta previa specified as without hemorrhage, second trimester: Secondary | ICD-10-CM | POA: Diagnosis not present

## 2023-10-06 DIAGNOSIS — O99891 Other specified diseases and conditions complicating pregnancy: Secondary | ICD-10-CM | POA: Diagnosis present

## 2023-10-06 DIAGNOSIS — Z3A19 19 weeks gestation of pregnancy: Secondary | ICD-10-CM | POA: Diagnosis not present

## 2023-10-06 DIAGNOSIS — R197 Diarrhea, unspecified: Secondary | ICD-10-CM | POA: Diagnosis not present

## 2023-10-06 DIAGNOSIS — Z3A18 18 weeks gestation of pregnancy: Secondary | ICD-10-CM

## 2023-10-06 DIAGNOSIS — R1030 Lower abdominal pain, unspecified: Secondary | ICD-10-CM | POA: Diagnosis not present

## 2023-10-06 DIAGNOSIS — O26892 Other specified pregnancy related conditions, second trimester: Secondary | ICD-10-CM | POA: Diagnosis not present

## 2023-10-06 DIAGNOSIS — O23592 Infection of other part of genital tract in pregnancy, second trimester: Secondary | ICD-10-CM | POA: Insufficient documentation

## 2023-10-06 DIAGNOSIS — O4412 Placenta previa with hemorrhage, second trimester: Secondary | ICD-10-CM | POA: Diagnosis not present

## 2023-10-06 DIAGNOSIS — B9689 Other specified bacterial agents as the cause of diseases classified elsewhere: Secondary | ICD-10-CM | POA: Diagnosis not present

## 2023-10-06 DIAGNOSIS — R109 Unspecified abdominal pain: Secondary | ICD-10-CM

## 2023-10-06 DIAGNOSIS — O4692 Antepartum hemorrhage, unspecified, second trimester: Secondary | ICD-10-CM | POA: Diagnosis not present

## 2023-10-06 DIAGNOSIS — R519 Headache, unspecified: Secondary | ICD-10-CM | POA: Insufficient documentation

## 2023-10-06 DIAGNOSIS — N760 Acute vaginitis: Secondary | ICD-10-CM

## 2023-10-06 LAB — URINALYSIS, ROUTINE W REFLEX MICROSCOPIC
Bacteria, UA: NONE SEEN
Bilirubin Urine: NEGATIVE
Glucose, UA: NEGATIVE mg/dL
Hgb urine dipstick: NEGATIVE
Ketones, ur: NEGATIVE mg/dL
Nitrite: NEGATIVE
Protein, ur: NEGATIVE mg/dL
Specific Gravity, Urine: 1.016 (ref 1.005–1.030)
pH: 7 (ref 5.0–8.0)

## 2023-10-06 LAB — WET PREP, GENITAL
Sperm: NONE SEEN
Trich, Wet Prep: NONE SEEN
WBC, Wet Prep HPF POC: >=10 — AB (ref <10)
Yeast Wet Prep HPF POC: NONE SEEN

## 2023-10-06 MED ORDER — CYCLOBENZAPRINE HCL 5 MG PO TABS
5.0000 mg | ORAL_TABLET | Freq: Three times a day (TID) | ORAL | Status: DC | PRN
  Administered 2023-10-06: 5 mg via ORAL
  Filled 2023-10-06: qty 1

## 2023-10-06 MED ORDER — ACETAMINOPHEN 500 MG PO TABS
1000.0000 mg | ORAL_TABLET | Freq: Once | ORAL | Status: AC
  Administered 2023-10-06: 1000 mg via ORAL
  Filled 2023-10-06: qty 2

## 2023-10-06 MED ORDER — METRONIDAZOLE 500 MG PO TABS: 500.0000 mg | ORAL_TABLET | Freq: Two times a day (BID) | ORAL | 0 refills | Status: DC

## 2023-10-06 NOTE — Telephone Encounter (Signed)
 Incoming call from the [facility]. Patient's full name and DOB verified.  GORMAN- [id number] at [redacted]w[redacted]d calling with concern of increasing vaginal bleeding and diarrhea for the past week B- Pt has hx of multiple miscarriages and states this feels similar to how the last one started. Pt has been taking imodium  for diarrhea without improvement. Pt states she has diarrhea about 3 times per hour. Vaginal bleeding varies from spotting to needing to change a pad 2-3 times per day. A- Pt denies dizziness, SOB, changes in vision, or passing out R- Pt advised that with the vaginal bleeding and diarrhea which did not improve with medication, she should be seen at Labor and Delivery Triage. Pt states she will go to [facility] due to it being closer to her house.

## 2023-10-06 NOTE — MAU Note (Signed)
 G7P2 at 19.2 c/o N, V & D for the last week with increased frequency of diarrhea.  Pt reports some VB and spotting with increased BM.  No known OB complications.    Pat OB complications of cholestasis, tachycardia, kidney stones, hyperemesis, SVDx1, PCSx1, Pph with D&C

## 2023-10-06 NOTE — MAU Provider Note (Addendum)
 Chief Complaint: Diarrhea, Nausea, and Abdominal Pain     SUBJECTIVE HPI: Hannah Walker is a 23 y.o. H2E8857 at [redacted]w[redacted]d by LMP who presents to maternity admissions reporting diarrhea in the last week with vaginal bleeding.  About 9 days ago she started having loose stools, Bristol type 6-7, many times a day.  Notes she is currently stooling at least 7 times a day.  Denies infectious symptoms and sick contacts.  Has used Imodium  regularly for the past several days without benefit.  7 days ago she started having spotting and in the past several days transitioned from wearing a panty liner to a pad for her bleeding.  Now changing the pad once daily.  Denies clotting or passage of tissue.  Is currently having constant lower abdominal cramping.  Has taken Tylenol  intermittently with some improvement of her pain.  She does have a low-grade headache currently.  She reports no vaginal itching/burning, urinary symptoms, or fever/chills.     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   Hyperemesis gravidarum 12/21/2020   PCO (polycystic ovaries)    Renal disorder    kidney stones   Tachycardia    Urine retention    Vaginal Pap smear, abnormal    Past Surgical History:  Procedure Laterality Date   DILATION AND CURETTAGE OF UTERUS N/A 06/01/2022   Procedure: DILATATION AND EVACUATION WITH ULTRASOUND GUIDANCE, REMOVAL OF INTRAUTERINE DEVICE AND INSERTION OF INTRAUTERINE DEVICE;  Surgeon: Ozan, Jennifer, DO;  Location: [facility] OR;  Service: Gynecology;  Laterality: N/A;   DILATION AND EVACUATION N/A 04/13/2021   Procedure: DILATATION AND EVACUATION;  Surgeon: Diedre Rosaline BRAVO, MD;  Location: [facility] LD ORS;  Service: Gynecology;  Laterality: N/A;   TENDON REPAIR Right 05/15/2018   Procedure: EXTENSOR TENDON REPAIR RIGHT THUMB;  Surgeon: Lorretta Dess, MD;  Location: [facility] OR;  Service: Plastics;  Laterality: Right;   Social History   Socioeconomic  History   Marital status: Single    Spouse name: Not on file   Number of children: Not on file   Years of education: Not on file   Highest education level: Not on file  Occupational History   Not on file  Tobacco Use   Smoking status: Former    Types: E-cigarettes    Quit date: 07/30/2020    Years since quitting: 3.1   Smokeless tobacco: Never  Vaping Use   Vaping status: Former   Quit date: 08/13/2020   Substances: Nicotine  Substance and Sexual Activity   Alcohol use: No   Drug use: No   Sexual activity: Not Currently  Other Topics Concern   Not on file  Social History Narrative   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   She was home schooled.   She lives with her dad only. She has no siblings.   She enjoys playing with her dogs and being on her phone.   Social Drivers of Corporate investment banker Strain: Not on file  Food Insecurity: Low Risk  (04/30/2022)   Received from [facility]   Hunger Vital Sign    Within the past 12 months, you worried that your food would run out before you got money to buy more: Never true    Within the past 12 months, the food you bought just didn't last and you didn't have money to get more: Not on file  Recent Concern: Food  Insecurity - Medium Risk (04/30/2022)   Received from [facility] visits prior to 06/01/2022.   Food    Within the past 12 months, you worried that your food would run out before you got money to buy more food: Never true    Within the past 12 months, the food you bought just didn't last and you didn't have money to get more: Sometimes true  Transportation Needs: Not on file  Physical Activity: Not on file  Stress: Not on file  Social Connections: Not on file  Intimate Partner Violence: Low Risk  (05/02/2022)   Received from [facility] visits prior to 06/01/2022.   Safety    How often does anyone, including family and friends, physically hurt you?: Never    How often does anyone,  including family and friends, insult or talk down to you?: Never    How often does anyone, including family and friends, threaten you with harm?: Never    How often does anyone, including family and friends, scream or curse at you?: Never   No current facility-administered medications on file prior to encounter.   Current Outpatient Medications on File Prior to Encounter  Medication Sig Dispense Refill   acetaminophen  (TYLENOL ) 500 MG tablet Take 1,000 mg by mouth every 6 (six) hours as needed.     loperamide  (IMODIUM ) 2 MG capsule Take 2 mg by mouth as needed for diarrhea or loose stools.     Prenatal Vit-Fe Fumarate-FA (PRENATAL COMPLETE) 14-0.4 MG TABS Take 1 tablet by mouth daily. 60 tablet 3   ALPRAZolam  (XANAX ) 1 MG tablet Take 1 mg by mouth at bedtime as needed for anxiety.     cholecalciferol  (VITAMIN D3) 25 MCG (1000 UNIT) tablet Take 1,000 Units by mouth daily.     famotidine  (PEPCID ) 20 MG tablet Take 1 tablet (20 mg total) by mouth 2 (two) times daily as needed for heartburn or indigestion. 60 tablet 2   famotidine  (PEPCID ) 20 MG tablet Take 1 tablet (20 mg total) by mouth 2 (two) times daily as needed for heartburn or indigestion. 30 tablet 1   Iron-Vitamin C (IRON 100/C PO) Take by mouth.     lamoTRIgine  (LAMICTAL ) 25 MG tablet Take 25 mg by mouth daily.     ondansetron  (ZOFRAN ) 4 MG tablet Take 2 tablets (8 mg total) by mouth 2 (two) times daily. 20 tablet 0   ondansetron  (ZOFRAN ) 4 MG tablet Take 1 tablet (4 mg total) by mouth every 8 (eight) hours as needed for nausea or vomiting. 20 tablet 0   Prenatal Vit-Fe Fumarate-FA (PREPLUS) 27-1 MG TABS Take by mouth.     pyridOXINE  (VITAMIN B6) 50 MG tablet Take 50 mg by mouth daily.     scopolamine  (TRANSDERM-SCOP) 1 MG/3DAYS Place 1 patch (1.5 mg total) onto the skin every 3 (three) days. 10 patch 12   scopolamine  (TRANSDERM-SCOP) 1 MG/3DAYS Place 1 patch (1.5 mg total) onto the skin every 3 (three) days. 10 patch 12   sertraline   (ZOLOFT ) 100 MG tablet Take 50 mg by mouth daily.     [DISCONTINUED] ESTARYLLA 0.25-35 MG-MCG tablet TK 1 T PO D     [DISCONTINUED] etonogestrel (NEXPLANON) 68 MG IMPL implant Nexplanon 68 mg subdermal implant  Inject 1 implant by subcutaneous route.     [DISCONTINUED] topiramate  (TOPAMAX ) 25 MG tablet TAKE 4 TABLETS BY MOUTH AT NIGHT TIME 124 tablet 0   Allergies  Allergen Reactions   Ampicillin Anaphylaxis  Has patient had a PCN reaction causing immediate rash, facial/tongue/throat swelling, SOB or lightheadedness with hypotension: Yes Has patient had a PCN reaction causing severe rash involving mucus membranes or skin necrosis: No Has patient had a PCN reaction that required hospitalization: No Has patient had a PCN reaction occurring within the last 10 years: Yes If all of the above answers are NO, then may proceed with Cephalosporin use.    Penicillins Anaphylaxis   Tape Rash    Adhesive tape of any kind     ROS:  Pertinent positives/negatives listed above.  I have reviewed patient's Past Medical Hx, Surgical Hx, Family Hx, Social Hx, medications and allergies.   Physical Exam  Patient Vitals for the past 24 hrs:  BP Temp Temp src Pulse Resp  10/06/23 1435 109/69 98.4 F (36.9 C) Oral 79 16  10/06/23 1135 108/65 98.5 F (36.9 C) Oral (!) 104 --   Constitutional: Well-developed, well-nourished female in no acute distress.  Cardiovascular: normal rate, no murmurs rubs or gallops Respiratory: normal effort, clear to auscultation bilaterally GI: Abd gravid to the umbilicus, mild tenderness to palpation over the suprapubic region MS: Extremities nontender, no edema Neurologic: Alert and oriented x 4.   PELVIC EXAM: Cervix pink, visually closed, without lesion, significant white creamy discharge, vaginal walls and external genitalia normal, anus without visualization of external hemorrhoids  Doppler: 155  LAB RESULTS Results for orders placed or performed during the  hospital encounter of 10/06/23 (from the past 24 hours)  Urinalysis, Routine w reflex microscopic -Urine, Clean Catch     Status: Abnormal   Collection Time: 10/06/23 12:39 PM  Result Value Ref Range   Color, Urine YELLOW YELLOW   APPearance HAZY (A) CLEAR   Specific Gravity, Urine 1.016 1.005 - 1.030   pH 7.0 5.0 - 8.0   Glucose, UA NEGATIVE NEGATIVE mg/dL   Hgb urine dipstick NEGATIVE NEGATIVE   Bilirubin Urine NEGATIVE NEGATIVE   Ketones, ur NEGATIVE NEGATIVE mg/dL   Protein, ur NEGATIVE NEGATIVE mg/dL   Nitrite NEGATIVE NEGATIVE   Leukocytes,Ua TRACE (A) NEGATIVE   RBC / HPF 0-5 0 - 5 RBC/hpf   WBC, UA 0-5 0 - 5 WBC/hpf   Bacteria, UA NONE SEEN NONE SEEN   Squamous Epithelial / HPF 0-5 0 - 5 /HPF   Mucus PRESENT   Wet prep, genital     Status: Abnormal   Collection Time: 10/06/23 12:40 PM  Result Value Ref Range   Yeast Wet Prep HPF POC NONE SEEN NONE SEEN   Trich, Wet Prep NONE SEEN NONE SEEN   Clue Cells Wet Prep HPF POC PRESENT (A) NONE SEEN   WBC, Wet Prep HPF POC >=10 (A) <10   Sperm NONE SEEN        IMAGING No results found.    MAU Management/MDM: Orders Placed This Encounter  Procedures   Wet prep, genital   US  MFM OB LIMITED   Urinalysis, Routine w reflex microscopic -Urine, Clean Catch   Discharge patient    Meds ordered this encounter  Medications   acetaminophen  (TYLENOL ) tablet 1,000 mg   cyclobenzaprine  (FLEXERIL ) tablet 5 mg   metroNIDAZOLE  (FLAGYL ) 500 MG tablet    Sig: Take 1 tablet (500 mg total) by mouth 2 (two) times daily for 7 days.    Dispense:  14 tablet    Refill:  0     ASSESSMENT 1. Placenta previa in second trimester   2. [redacted] weeks gestation of pregnancy   3.  Bacterial vaginosis    - Patient speculum exam and wet prep consistent with bacterial vaginosis - Flagyl  500 mg BID sent for 7 days - OB ultrasound reveals anterior placenta previa; patient counseling provided - Instructions for pelvic rest until resolution of previa or  delivery - Tylenol  and Flexeril  given during visit for pain relief  PLAN  - Follow up with regular OB provider - Pelvic rest - Discharge home with strict return precautions.  Allergies as of 10/06/2023       Reactions   Ampicillin Anaphylaxis   Has patient had a PCN reaction causing immediate rash, facial/tongue/throat swelling, SOB or lightheadedness with hypotension: Yes Has patient had a PCN reaction causing severe rash involving mucus membranes or skin necrosis: No Has patient had a PCN reaction that required hospitalization: No Has patient had a PCN reaction occurring within the last 10 years: Yes If all of the above answers are NO, then may proceed with Cephalosporin use.   Penicillins Anaphylaxis   Tape Rash   Adhesive tape of any kind         Medication List     TAKE these medications    acetaminophen  500 MG tablet Commonly known as: TYLENOL  Take 1,000 mg by mouth every 6 (six) hours as needed.   ALPRAZolam  1 MG tablet Commonly known as: XANAX  Take 1 mg by mouth at bedtime as needed for anxiety.   cholecalciferol  25 MCG (1000 UNIT) tablet Commonly known as: VITAMIN D3 Take 1,000 Units by mouth daily.   famotidine  20 MG tablet Commonly known as: Pepcid  Take 1 tablet (20 mg total) by mouth 2 (two) times daily as needed for heartburn or indigestion.   famotidine  20 MG tablet Commonly known as: Pepcid  Take 1 tablet (20 mg total) by mouth 2 (two) times daily as needed for heartburn or indigestion.   IRON 100/C PO Take by mouth.   lamoTRIgine  25 MG tablet Commonly known as: LAMICTAL  Take 25 mg by mouth daily.   loperamide  2 MG capsule Commonly known as: IMODIUM  Take 2 mg by mouth as needed for diarrhea or loose stools.   metroNIDAZOLE  500 MG tablet Commonly known as: FLAGYL  Take 1 tablet (500 mg total) by mouth 2 (two) times daily for 7 days.   ondansetron  4 MG tablet Commonly known as: Zofran  Take 2 tablets (8 mg total) by mouth 2 (two) times  daily.   ondansetron  4 MG tablet Commonly known as: Zofran  Take 1 tablet (4 mg total) by mouth every 8 (eight) hours as needed for nausea or vomiting.   PrePLUS 27-1 MG Tabs Take by mouth.   Prenatal Complete 14-0.4 MG Tabs Take 1 tablet by mouth daily.   pyridOXINE  50 MG tablet Commonly known as: VITAMIN B6 Take 50 mg by mouth daily.   scopolamine  1 MG/3DAYS Commonly known as: TRANSDERM-SCOP Place 1 patch (1.5 mg total) onto the skin every 3 (three) days.   scopolamine  1 MG/3DAYS Commonly known as: TRANSDERM-SCOP Place 1 patch (1.5 mg total) onto the skin every 3 (three) days.   sertraline  100 MG tablet Commonly known as: ZOLOFT  Take 50 mg by mouth daily.        Follow-up Information     [facility] up.   Specialties: Obstetrics, Obstetrics and Gynecology, General Practice Why: for routinely scheduled OB appointment Contact information: [facility] [address] [place] [address] [phone]                Elio Alena Morrison, MD Family Medicine  PGY1 10/06/2023  2:51 PM  GME ATTESTATION:  Evaluation and management procedures were performed by the [facility] Medicine Resident under my supervision. I was immediately available for direct supervision, assistance and direction throughout this encounter.  I also confirm that I have verified the information documented in the resident's note, and that I have also personally reperformed the pertinent components of the physical exam and all of the medical decision making activities.  I have also made any necessary editorial changes.  Mardy Shropshire, MD OB Fellow, Faculty [facility], Center for [facility] 10/06/2023 3:02 PM

## 2023-10-06 NOTE — Telephone Encounter (Signed)
 Patient states she called after hours on Fri am and her symptoms of diarrhea x 1 1/2 weeks spotting and having to change pads throughout the day vomiting. Stomach cramping keeping fluids down dizziness and lightheaded denies vision changes 18.2 Please advise [phone]

## 2023-10-06 NOTE — Telephone Encounter (Signed)
 This patient has been lost to follow up - has no showed last few visits and does not have anatomy US  scheduled. Has not been seen since 10 weeks and now 18 weeks. Was seen at [facility] today for vaginal bleeding - notes and US  unavailable for view at this time.   Please contact patient to schedule the following: 1. ROB visit as soon as able 2. Anatomy US  in next 1-3 weeks

## 2023-10-07 LAB — GC/CHLAMYDIA PROBE AMP (~~LOC~~) NOT AT ARMC
Chlamydia: POSITIVE — AB
Comment: NEGATIVE
Comment: NORMAL
Neisseria Gonorrhea: NEGATIVE

## 2023-10-08 ENCOUNTER — Telehealth (HOSPITAL_COMMUNITY): Payer: Self-pay

## 2023-10-08 ENCOUNTER — Ambulatory Visit (HOSPITAL_COMMUNITY): Payer: Self-pay

## 2023-10-08 MED ORDER — AZITHROMYCIN 500 MG PO TABS
1000.0000 mg | ORAL_TABLET | Freq: Once | ORAL | 0 refills | Status: AC
Start: 2023-10-08 — End: 2023-10-08

## 2023-10-08 NOTE — Telephone Encounter (Signed)
 This patient tested positive for Chlamydia   She is allergic to Penicillin and Ampicillin I have informed the patient of her results and confirmed her pharmacy is correct in her chart. Treatment has been sent per protocol.   Arlander Katheryn RAMAN, RN

## 2023-10-09 ENCOUNTER — Other Ambulatory Visit: Payer: Self-pay

## 2023-10-09 ENCOUNTER — Encounter (HOSPITAL_COMMUNITY): Payer: Self-pay | Admitting: Obstetrics and Gynecology

## 2023-10-09 ENCOUNTER — Observation Stay (HOSPITAL_COMMUNITY)
Admission: EM | Admit: 2023-10-09 | Discharge: 2023-10-10 | Disposition: A | Discharge status: Home / Self Care | Attending: Obstetrics & Gynecology | Admitting: Obstetrics & Gynecology

## 2023-10-09 DIAGNOSIS — A749 Chlamydial infection, unspecified: Secondary | ICD-10-CM | POA: Diagnosis not present

## 2023-10-09 DIAGNOSIS — R519 Headache, unspecified: Secondary | ICD-10-CM | POA: Diagnosis not present

## 2023-10-09 DIAGNOSIS — I959 Hypotension, unspecified: Secondary | ICD-10-CM | POA: Diagnosis not present

## 2023-10-09 DIAGNOSIS — D62 Acute posthemorrhagic anemia: Secondary | ICD-10-CM

## 2023-10-09 DIAGNOSIS — O42919 Preterm premature rupture of membranes, unspecified as to length of time between rupture and onset of labor, unspecified trimester: Secondary | ICD-10-CM | POA: Diagnosis not present

## 2023-10-09 DIAGNOSIS — R42 Dizziness and giddiness: Secondary | ICD-10-CM | POA: Diagnosis not present

## 2023-10-09 DIAGNOSIS — O039 Complete or unspecified spontaneous abortion without complication: Secondary | ICD-10-CM | POA: Diagnosis not present

## 2023-10-09 DIAGNOSIS — O98312 Other infections with a predominantly sexual mode of transmission complicating pregnancy, second trimester: Secondary | ICD-10-CM | POA: Insufficient documentation

## 2023-10-09 DIAGNOSIS — R55 Syncope and collapse: Secondary | ICD-10-CM | POA: Diagnosis not present

## 2023-10-09 DIAGNOSIS — Z87891 Personal history of nicotine dependence: Secondary | ICD-10-CM | POA: Diagnosis not present

## 2023-10-09 DIAGNOSIS — O4402 Placenta previa specified as without hemorrhage, second trimester: Secondary | ICD-10-CM | POA: Diagnosis not present

## 2023-10-09 DIAGNOSIS — Z3A19 19 weeks gestation of pregnancy: Secondary | ICD-10-CM | POA: Diagnosis not present

## 2023-10-09 DIAGNOSIS — R58 Hemorrhage, not elsewhere classified: Secondary | ICD-10-CM | POA: Diagnosis not present

## 2023-10-09 DIAGNOSIS — O9089 Other complications of the puerperium, not elsewhere classified: Secondary | ICD-10-CM | POA: Diagnosis not present

## 2023-10-09 DIAGNOSIS — I10 Essential (primary) hypertension: Secondary | ICD-10-CM | POA: Diagnosis not present

## 2023-10-09 HISTORY — DX: Personal history of other diseases of the female genital tract: Z87.42

## 2023-10-09 LAB — CBC
HCT: 30.1 % — ABNORMAL LOW (ref 36.0–46.0)
HCT: 30.9 % — ABNORMAL LOW (ref 36.0–46.0)
HCT: 35.3 % — ABNORMAL LOW (ref 36.0–46.0)
Hemoglobin: 10.4 g/dL — ABNORMAL LOW (ref 12.0–15.0)
Hemoglobin: 10.9 g/dL — ABNORMAL LOW (ref 12.0–15.0)
Hemoglobin: 12.2 g/dL (ref 12.0–15.0)
MCH: 31.1 pg (ref 26.0–34.0)
MCH: 31.4 pg (ref 26.0–34.0)
MCH: 31.4 pg (ref 26.0–34.0)
MCHC: 34.6 g/dL (ref 30.0–36.0)
MCHC: 34.6 g/dL (ref 30.0–36.0)
MCHC: 35.3 g/dL (ref 30.0–36.0)
MCV: 89 fL (ref 80.0–100.0)
MCV: 90.1 fL (ref 80.0–100.0)
MCV: 90.7 fL (ref 80.0–100.0)
Platelets: 174 K/uL (ref 150–400)
Platelets: 209 K/uL (ref 150–400)
Platelets: 235 K/uL (ref 150–400)
RBC: 3.34 MIL/uL — ABNORMAL LOW (ref 3.87–5.11)
RBC: 3.47 MIL/uL — ABNORMAL LOW (ref 3.87–5.11)
RBC: 3.89 MIL/uL (ref 3.87–5.11)
RDW: 13.6 % (ref 11.5–15.5)
RDW: 13.8 % (ref 11.5–15.5)
RDW: 13.9 % (ref 11.5–15.5)
WBC: 10.4 K/uL (ref 4.0–10.5)
WBC: 15.3 K/uL — ABNORMAL HIGH (ref 4.0–10.5)
WBC: 20.1 K/uL — ABNORMAL HIGH (ref 4.0–10.5)
nRBC: 0 % (ref 0.0–0.2)
nRBC: 0 % (ref 0.0–0.2)
nRBC: 0 % (ref 0.0–0.2)

## 2023-10-09 LAB — PREPARE RBC (CROSSMATCH)

## 2023-10-09 MED ORDER — SCOPOLAMINE 1 MG/3DAYS TD PT72
1.0000 | MEDICATED_PATCH | TRANSDERMAL | Status: DC
  Administered 2023-10-09: 1.5 mg via TRANSDERMAL
  Filled 2023-10-09: qty 1

## 2023-10-09 MED ORDER — SODIUM CHLORIDE 0.9% IV SOLUTION: Freq: Once | INTRAVENOUS | Status: DC

## 2023-10-09 MED ORDER — METOCLOPRAMIDE HCL 5 MG/ML IJ SOLN
10.0000 mg | Freq: Once | INTRAMUSCULAR | Status: AC
  Administered 2023-10-09: 10 mg via INTRAVENOUS
  Filled 2023-10-09: qty 2

## 2023-10-09 MED ORDER — METHYLERGONOVINE MALEATE 0.2 MG/ML IJ SOLN: 0.2000 mg | INTRAMUSCULAR | Status: DC | PRN

## 2023-10-09 MED ORDER — ALPRAZOLAM 0.5 MG PO TABS
1.0000 mg | ORAL_TABLET | Freq: Every evening | ORAL | Status: DC | PRN
Start: 2023-10-09 — End: 2023-10-10

## 2023-10-09 MED ORDER — SODIUM CHLORIDE 0.9 % IV SOLN: Freq: Once | INTRAVENOUS | Status: AC

## 2023-10-09 MED ORDER — DIPHENHYDRAMINE HCL 25 MG PO CAPS
25.0000 mg | ORAL_CAPSULE | Freq: Once | ORAL | Status: AC
  Administered 2023-10-09: 25 mg via ORAL
  Filled 2023-10-09: qty 1

## 2023-10-09 MED ORDER — METRONIDAZOLE 500 MG PO TABS
500.0000 mg | ORAL_TABLET | Freq: Two times a day (BID) | ORAL | Status: DC
  Administered 2023-10-09 – 2023-10-10 (×3): 500 mg via ORAL
  Filled 2023-10-09 (×3): qty 1

## 2023-10-09 MED ORDER — ONDANSETRON HCL 4 MG/2ML IJ SOLN
4.0000 mg | Freq: Once | INTRAMUSCULAR | Status: AC
  Administered 2023-10-09: 4 mg via INTRAVENOUS
  Filled 2023-10-09: qty 2

## 2023-10-09 MED ORDER — MISOPROSTOL 200 MCG PO TABS: 800.0000 ug | ORAL_TABLET | Freq: Once | ORAL | Status: AC

## 2023-10-09 MED ORDER — SODIUM CHLORIDE 0.9% IV SOLUTION: Freq: Once | INTRAVENOUS | Status: AC

## 2023-10-09 MED ORDER — DOCUSATE SODIUM 100 MG PO CAPS
100.0000 mg | ORAL_CAPSULE | Freq: Every day | ORAL | Status: DC
  Filled 2023-10-09: qty 1

## 2023-10-09 MED ORDER — FAMOTIDINE 20 MG PO TABS: 20.0000 mg | ORAL_TABLET | Freq: Two times a day (BID) | ORAL | Status: DC | PRN

## 2023-10-09 MED ORDER — VITAMIN D 25 MCG (1000 UNIT) PO TABS
1000.0000 [IU] | ORAL_TABLET | Freq: Every day | ORAL | Status: DC
  Filled 2023-10-09 (×2): qty 1

## 2023-10-09 MED ORDER — METHYLERGONOVINE MALEATE 0.2 MG/ML IJ SOLN
INTRAMUSCULAR | Status: AC
  Administered 2023-10-09: 0.2 mg via INTRAMUSCULAR
  Filled 2023-10-09: qty 1

## 2023-10-09 MED ORDER — LAMOTRIGINE 25 MG PO TABS: 25.0000 mg | ORAL_TABLET | Freq: Every day | ORAL | Status: DC

## 2023-10-09 MED ORDER — DOXYCYCLINE HYCLATE 100 MG PO TABS
100.0000 mg | ORAL_TABLET | Freq: Two times a day (BID) | ORAL | Status: DC
  Administered 2023-10-09 – 2023-10-10 (×3): 100 mg via ORAL
  Filled 2023-10-09 (×3): qty 1

## 2023-10-09 MED ORDER — LOPERAMIDE HCL 2 MG PO CAPS: 2.0000 mg | ORAL_CAPSULE | ORAL | Status: DC | PRN

## 2023-10-09 MED ORDER — ACETAMINOPHEN 325 MG PO TABS
650.0000 mg | ORAL_TABLET | Freq: Once | ORAL | Status: AC
  Administered 2023-10-09: 650 mg via ORAL
  Filled 2023-10-09: qty 2

## 2023-10-09 MED ORDER — VITAMIN B-6 25 MG PO TABS: 50.0000 mg | ORAL_TABLET | Freq: Every day | ORAL | Status: DC

## 2023-10-09 MED ORDER — SERTRALINE HCL 50 MG PO TABS
50.0000 mg | ORAL_TABLET | Freq: Every day | ORAL | Status: DC
  Administered 2023-10-09 – 2023-10-10 (×2): 50 mg via ORAL
  Filled 2023-10-09 (×2): qty 1

## 2023-10-09 MED ORDER — LACTATED RINGERS IV SOLN: 125.0000 mL/h | INTRAVENOUS | Status: AC

## 2023-10-09 MED ORDER — ACETAMINOPHEN 500 MG PO TABS
1000.0000 mg | ORAL_TABLET | Freq: Once | ORAL | Status: AC
  Administered 2023-10-09: 1000 mg via ORAL
  Filled 2023-10-09: qty 2

## 2023-10-09 MED ORDER — ZOLPIDEM TARTRATE 5 MG PO TABS: 5.0000 mg | ORAL_TABLET | Freq: Every evening | ORAL | Status: DC | PRN

## 2023-10-09 MED ORDER — MISOPROSTOL 200 MCG PO TABS
800.0000 ug | ORAL_TABLET | Freq: Once | ORAL | Status: DC
Start: 2023-10-09 — End: 2023-10-09

## 2023-10-09 MED ORDER — FENTANYL CITRATE (PF) 100 MCG/2ML IJ SOLN
100.0000 ug | INTRAMUSCULAR | Status: DC | PRN
  Administered 2023-10-09 (×2): 100 ug via INTRAVENOUS
  Filled 2023-10-09 (×2): qty 2

## 2023-10-09 MED ORDER — KETOROLAC TROMETHAMINE 30 MG/ML IJ SOLN
30.0000 mg | Freq: Four times a day (QID) | INTRAMUSCULAR | Status: AC | PRN
  Administered 2023-10-09 (×2): 30 mg via INTRAVENOUS
  Filled 2023-10-09 (×2): qty 1

## 2023-10-09 MED ORDER — MISOPROSTOL 200 MCG PO TABS
ORAL_TABLET | ORAL | Status: AC
  Administered 2023-10-09: 800 ug via RECTAL
  Filled 2023-10-09: qty 4

## 2023-10-09 MED ORDER — ONDANSETRON HCL 4 MG/2ML IJ SOLN
4.0000 mg | Freq: Four times a day (QID) | INTRAMUSCULAR | Status: DC | PRN
  Administered 2023-10-09: 4 mg via INTRAVENOUS
  Filled 2023-10-09: qty 2

## 2023-10-09 NOTE — MAU Provider Note (Signed)
 Chief Complaint:  No chief complaint on file.   None    HPI: Hannah Walker is a 23 y.o. H2E8857 at 89w5dwho presents to maternity admissions reporting having delivered her fetus at home tonight with subsequent bleeding.  States felt cramping and went to bathroom. Had to bear down once to have a BM and baby came out.  Has had diarrhea over the past week.  Was seen here on 7/7 with bleeding and US  showed an anterior placenta previa with cervix length of 4.7.  Has been followed at [facility] but was lost to followup there.  Reportedly + for Chlamydia on 7/7. SABRA She denies dizziness, diarrhea, or fever/chills. .  Vaginal Bleeding This is a recurrent problem. The current episode started today. The problem occurs constantly. The pain is moderate. Associated symptoms include abdominal pain and diarrhea. Pertinent negatives include no chills or constipation. Nothing aggravates the symptoms. She has tried nothing for the symptoms.    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   Hyperemesis gravidarum 12/21/2020   PCO (polycystic ovaries)    Renal disorder    kidney stones   Tachycardia    Urine retention    Vaginal Pap smear, abnormal     Past obstetric history: OB History  Gravida Para Term Preterm AB Living  7 2 1 1 4 2   SAB IAB Ectopic Multiple Live Births  4 0 0 0 2    # Outcome Date GA Lbr Len/2nd Weight Sex Type Anes PTL Lv  7 Current           6 SAB 12/2022     SAB     5 Preterm 04/29/22 [redacted]w[redacted]d    CS-LTranv   LIV  4 SAB 07/2021          3 Term 04/13/21 [redacted]w[redacted]d 09:18 / 00:36 3657 g M Vag-Spont EPI  LIV  2 SAB 05/2020          1 SAB 12/2019            Past Surgical History: Past Surgical History:  Procedure Laterality Date   DILATION AND CURETTAGE OF UTERUS N/A 06/01/2022   Procedure: DILATATION AND EVACUATION WITH ULTRASOUND GUIDANCE, REMOVAL OF INTRAUTERINE DEVICE AND INSERTION OF INTRAUTERINE  DEVICE;  Surgeon: Ozan, Jennifer, DO;  Location: [facility] OR;  Service: Gynecology;  Laterality: N/A;   DILATION AND EVACUATION N/A 04/13/2021   Procedure: DILATATION AND EVACUATION;  Surgeon: Diedre Rosaline BRAVO, MD;  Location: [facility] LD ORS;  Service: Gynecology;  Laterality: N/A;   TENDON REPAIR Right 05/15/2018   Procedure: EXTENSOR TENDON REPAIR RIGHT THUMB;  Surgeon: Lorretta Dess, MD;  Location: [facility] OR;  Service: Plastics;  Laterality: Right;    Family History: Family History  Problem Relation Age of Onset   Healthy Mother    Thyroid disease Mother    Healthy Father    Thyroid disease Maternal Grandmother     Social History: Social History   Tobacco Use   Smoking status: Former    Types: E-cigarettes    Quit date: 07/30/2020    Years since quitting: 3.1   Smokeless tobacco: Never  Vaping Use   Vaping status: Former   Quit date: 08/13/2020   Substances: Nicotine  Substance Use Topics   Alcohol use: No   Drug use: No    Allergies:  Allergies  Allergen Reactions   Ampicillin Anaphylaxis  Has patient had a PCN reaction causing immediate rash, facial/tongue/throat swelling, SOB or lightheadedness with hypotension: Yes Has patient had a PCN reaction causing severe rash involving mucus membranes or skin necrosis: No Has patient had a PCN reaction that required hospitalization: No Has patient had a PCN reaction occurring within the last 10 years: Yes If all of the above answers are NO, then may proceed with Cephalosporin use.    Penicillins Anaphylaxis   Tape Rash    Adhesive tape of any kind     Meds:  Medications Prior to Admission  Medication Sig Dispense Refill Last Dose/Taking   acetaminophen  (TYLENOL ) 500 MG tablet Take 1,000 mg by mouth every 6 (six) hours as needed.      ALPRAZolam  (XANAX ) 1 MG tablet Take 1 mg by mouth at bedtime as needed for anxiety.      cholecalciferol  (VITAMIN D3) 25 MCG (1000 UNIT) tablet Take 1,000 Units by mouth daily.      famotidine   (PEPCID ) 20 MG tablet Take 1 tablet (20 mg total) by mouth 2 (two) times daily as needed for heartburn or indigestion. 60 tablet 2    famotidine  (PEPCID ) 20 MG tablet Take 1 tablet (20 mg total) by mouth 2 (two) times daily as needed for heartburn or indigestion. 30 tablet 1    Iron-Vitamin C (IRON 100/C PO) Take by mouth.      lamoTRIgine  (LAMICTAL ) 25 MG tablet Take 25 mg by mouth daily.      loperamide  (IMODIUM ) 2 MG capsule Take 2 mg by mouth as needed for diarrhea or loose stools.      metroNIDAZOLE  (FLAGYL ) 500 MG tablet Take 1 tablet (500 mg total) by mouth 2 (two) times daily for 7 days. 14 tablet 0    ondansetron  (ZOFRAN ) 4 MG tablet Take 2 tablets (8 mg total) by mouth 2 (two) times daily. 20 tablet 0    ondansetron  (ZOFRAN ) 4 MG tablet Take 1 tablet (4 mg total) by mouth every 8 (eight) hours as needed for nausea or vomiting. 20 tablet 0    Prenatal Vit-Fe Fumarate-FA (PRENATAL COMPLETE) 14-0.4 MG TABS Take 1 tablet by mouth daily. 60 tablet 3    Prenatal Vit-Fe Fumarate-FA (PREPLUS) 27-1 MG TABS Take by mouth.      pyridOXINE  (VITAMIN B6) 50 MG tablet Take 50 mg by mouth daily.      scopolamine  (TRANSDERM-SCOP) 1 MG/3DAYS Place 1 patch (1.5 mg total) onto the skin every 3 (three) days. 10 patch 12    scopolamine  (TRANSDERM-SCOP) 1 MG/3DAYS Place 1 patch (1.5 mg total) onto the skin every 3 (three) days. 10 patch 12    sertraline  (ZOLOFT ) 100 MG tablet Take 50 mg by mouth daily.       I have reviewed patient's Past Medical Hx, Surgical Hx, Family Hx, Social Hx, medications and allergies.   ROS:  Review of Systems  Constitutional:  Negative for chills.  Gastrointestinal:  Positive for abdominal pain and diarrhea. Negative for constipation.  Genitourinary:  Positive for vaginal bleeding.   Other systems negative  Physical Exam  No data found. Constitutional: Well-developed, well-nourished female in no acute distress, tearful and painful.  Cardiovascular: normal rate and  rhythm Respiratory: normal effort GI: Abd soft, non-tender, gravid appropriate for gestational age.   No rebound or guarding. MS: Extremities nontender, no edema, normal ROM Neurologic: Alert and oriented x 4.   PELVIC EXAM: Cord protruding from vagina. Moderate vaginal bleeding, +762ml per RN   Labs: Ordered  Imaging:  Bedside done by Dr Erik after placenta delivered  MAU Course/MDM: I have reviewed the triage vital signs and the nursing notes.   Pertinent labs & imaging results that were available during my care of the patient were reviewed by me and considered in my medical decision making (see chart for details).      I have reviewed her medical records including past results, notes and treatments.   I have ordered labs and reviewed results.   Consult Dr Erik with presentation, exam findings and test results.  Treatments in MAU included Dr Erik delivered placenta, administered Cytotec  800mcg rectally, Methergine  IM given.  .    Assessment: Pregnancy at [redacted]w[redacted]d Status post preterm delivery  Placental previa Hemorrhage  Plan: Per Dr Erik.  Earnie Pouch CNM, MSN Certified Nurse-Midwife 10/09/2023 4:33 AM

## 2023-10-09 NOTE — MAU Note (Signed)
 Erik, MD at bedside.   0450 placenta delivered.

## 2023-10-09 NOTE — Plan of Care (Signed)

## 2023-10-09 NOTE — MAU Note (Signed)
 Hannah Walker is a 23 y.o. at [redacted]w[redacted]d here in MAU reporting: here by EMS. States she has had spotting on and off for the past week - has placenta previa. Started cramping around 0200 and went to the restroom at 0300 with the urge to have a BM - pushed and delivered fetus. Pt is having heavy VB - M. Trudy, CNM at bedside.   Vitals:   10/09/23 0435  BP: 115/71  Pulse: 89  Resp: 19  Temp: 99.5 F (37.5 C)  SpO2: 98%     0434 EBL 700

## 2023-10-09 NOTE — H&P (Signed)
 FACULTY PRACTICE ANTEPARTUM ADMISSION HISTORY AND PHYSICAL NOTE   History of Present Illness: Hannah Walker is a 23 y.o. H2E8857 at [redacted]w[redacted]d admitted for transfusion for symptomatic acute blood loss anemia after SAB at [redacted]w[redacted]d.  SAB had started at home but when she came to [facility]U, she had significant bleeding and the placenta was delivered intact.  She was symptomatic with her bleeding, and was given over three liters of IV fluids.  This did not help her symptoms; she was still noted to be orthostatic.  EBL was over 1.1 L at this point.  Patient was offered transfusion, risks and benefits discussed, and she agreed with this plan.  Denies any fevers, chills, sweats, dysuria, nausea, vomiting, other GI or GU symptoms or other general symptoms.  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   Hyperemesis gravidarum 12/21/2020   Migraine without aura and without status migrainosus, not intractable 07/23/2017   PCO (polycystic ovaries)    Renal disorder    kidney stones   Urine retention     Past Surgical History:  Procedure Laterality Date   DILATION AND CURETTAGE OF UTERUS N/A 06/01/2022   Procedure: DILATATION AND EVACUATION WITH ULTRASOUND GUIDANCE, REMOVAL OF INTRAUTERINE DEVICE AND INSERTION OF INTRAUTERINE DEVICE;  Surgeon: Ozan, Jennifer, DO;  Location: [facility] OR;  Service: Gynecology;  Laterality: N/A;   DILATION AND EVACUATION N/A 04/13/2021   Procedure: DILATATION AND EVACUATION;  Surgeon: Diedre Rosaline BRAVO, MD;  Location: [facility] LD ORS;  Service: Gynecology;  Laterality: N/A;   TENDON REPAIR Right 05/15/2018   Procedure: EXTENSOR TENDON REPAIR RIGHT THUMB;  Surgeon: Lorretta Dess, MD;  Location: [facility] OR;  Service: Plastics;  Laterality: Right;     OB History  Gravida Para Term Preterm AB Living  7 2 1 1 4 2   SAB IAB Ectopic Multiple Live Births  4 0 0 0 2    # Outcome Date GA Lbr Len/2nd Weight Sex Type Anes PTL Lv  7 Current           6 SAB 12/2022     SAB     5 Preterm 04/29/22 [redacted]w[redacted]d    CS-LTranv   LIV  4 SAB 07/2021          3 Term 04/13/21 [redacted]w[redacted]d 09:18 / 00:36 3657 g M Vag-Spont EPI  LIV  2 SAB 05/2020          1 SAB 12/2019            Social History   Socioeconomic History   Marital status: Single    Spouse name: Not on file   Number of children: Not on file   Years of education: Not on file   Highest education level: Not on file  Occupational History   Not on file  Tobacco Use   Smoking status: Former    Types: E-cigarettes    Quit date: 07/30/2020    Years since quitting: 3.1   Smokeless tobacco: Never  Vaping Use   Vaping status: Former   Quit date: 08/13/2020   Substances: Nicotine  Substance and Sexual Activity   Alcohol use: No   Drug use: No   Sexual activity: Not Currently  Other Topics Concern  Not on file  Social History Narrative   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   She was home schooled.   She lives with her dad only. She has no siblings.   She enjoys playing with her dogs and being on her phone.   Social Drivers of Corporate investment banker Strain: Not on file  Food Insecurity: Low Risk  (04/30/2022)   Received from [facility]   Hunger Vital Sign    Within the past 12 months, you worried that your food would run out before you got money to buy more: Never true    Within the past 12 months, the food you bought just didn't last and you didn't have money to get more: Not on file  Recent Concern: Food Insecurity - Medium Risk (04/30/2022)   Received from [facility] visits prior to 06/01/2022.   Food    Within the past 12 months, you worried that your food would run out before you got money to buy more food: Never true    Within the past 12 months, the food  you bought just didn't last and you didn't have money to get more: Sometimes true  Transportation Needs: Not on file  Physical Activity: Not on file  Stress: Not on file  Social Connections: Not on file    Family History  Problem Relation Age of Onset   Healthy Mother    Thyroid disease Mother    Healthy Father    Thyroid disease Maternal Grandmother     Allergies  Allergen Reactions   Ampicillin Anaphylaxis    Has patient had a PCN reaction causing immediate rash, facial/tongue/throat swelling, SOB or lightheadedness with hypotension: Yes Has patient had a PCN reaction causing severe rash involving mucus membranes or skin necrosis: No Has patient had a PCN reaction that required hospitalization: No Has patient had a PCN reaction occurring within the last 10 years: Yes If all of the above answers are NO, then may proceed with Cephalosporin use.    Penicillins Anaphylaxis   Tape Rash    Adhesive tape of any kind     Medications Prior to Admission  Medication Sig Dispense Refill Last Dose/Taking   ALPRAZolam  (XANAX ) 1 MG tablet Take 1 mg by mouth at bedtime as needed for anxiety.      cholecalciferol  (VITAMIN D3) 25 MCG (1000 UNIT) tablet Take 1,000 Units by mouth daily.      famotidine  (PEPCID ) 20 MG tablet Take 1 tablet (20 mg total) by mouth 2 (two) times daily as needed for heartburn or indigestion. 30 tablet 1    lamoTRIgine  (LAMICTAL ) 25 MG tablet Take 25 mg by mouth daily.      loperamide  (IMODIUM ) 2 MG capsule Take 2 mg by mouth as needed for diarrhea or loose stools.      metroNIDAZOLE  (FLAGYL ) 500 MG tablet Take 1 tablet (500 mg total) by mouth 2 (two) times daily for 7 days. 14 tablet 0    pyridOXINE  (VITAMIN B6) 50 MG tablet Take 50 mg by mouth daily.      scopolamine  (TRANSDERM-SCOP) 1 MG/3DAYS Place 1 patch (1.5 mg total) onto the skin every 3 (three) days. 10 patch 12    sertraline  (ZOLOFT ) 100 MG tablet Take 50 mg by mouth daily.       Review of Systems -  Negative except what is mentioned in HPI  Vitals:  BP (!) 84/48 (BP Location: Left Arm)   Pulse 91   Temp  99 F (37.2 C)   Resp 16   LMP 05/24/2023 (Exact Date)   SpO2 98%  Physical Examination: CONSTITUTIONAL: Tired  and pale appearing in no acute distress.  NECK: Normal range of motion, supple, no masses SKIN: Skin is warm and dry. No rash noted. Not diaphoretic. No erythema. No pallor. NEUROLOGIC: Alert and oriented to person, place, and time. Normal reflexes, muscle tone coordination. No cranial nerve deficit noted. PSYCHIATRIC: Normal mood and affect. Normal behavior. Normal judgment and thought content. CARDIOVASCULAR: Normal heart rate noted, regular rhythm RESPIRATORY: Effort and breath sounds normal, no problems with respiration noted ABDOMEN: Soft, nontender, nondistended, gravid. PELVIC: Deferred MUSCULOSKELETAL: Normal range of motion. No edema and no tenderness. 2+ distal pulses.  Labs:  Results for orders placed or performed during the hospital encounter of 10/09/23 (from the past 24 hours)  Type and screen [facility]   Collection Time: 10/09/23  5:36 AM  Result Value Ref Range   ABO/RH(D) O POS    Antibody Screen NEG    Sample Expiration      10/12/2023,2359 Performed at [facility], [address] [place], [place] [address]    Unit Number [id number]    Blood Component Type RED CELLS,LR    Unit division 00    Status of Unit ALLOCATED    Transfusion Status OK TO TRANSFUSE    Crossmatch Result Compatible    Unit Number [id number]    Blood Component Type RBC LR PHER2    Unit division 00    Status of Unit ALLOCATED    Transfusion Status OK TO TRANSFUSE    Crossmatch Result Compatible   BPAM RBC   Collection Time: 10/09/23  5:36 AM  Result Value Ref Range   Blood Product Unit Number [id number]    PRODUCT CODE E0382V00    Unit Type and Rh 5100    Blood Product Expiration Date 797491907640    ISSUE DATE / TIME 797492907867     Blood Product Unit Number [id number]    PRODUCT CODE [id number]    Unit Type and Rh 5100    Blood Product Expiration Date 797491917640   CBC   Collection Time: 10/09/23  5:38 AM  Result Value Ref Range   WBC 20.1 (H) 4.0 - 10.5 K/uL   RBC 3.89 3.87 - 5.11 MIL/uL   Hemoglobin 12.2 12.0 - 15.0 g/dL   HCT 64.6 (L) 63.9 - 53.9 %   MCV 90.7 80.0 - 100.0 fL   MCH 31.4 26.0 - 34.0 pg   MCHC 34.6 30.0 - 36.0 g/dL   RDW 86.1 88.4 - 84.4 %   Platelets 235 150 - 400 K/uL   nRBC 0.0 0.0 - 0.2 %  Prepare RBC (crossmatch)   Collection Time: 10/09/23 10:05 AM  Result Value Ref Range   Order Confirmation      ORDER PROCESSED BY BLOOD BANK Performed at [facility], [address], [place], [place] [address]     Imaging Studies: US  MFM OB LIMITED Result Date: 10/06/2023 ----------------------------------------------------------------------  OBSTETRICS REPORT                       (Signed Final 10/06/2023 03:00 pm) ---------------------------------------------------------------------- Patient Info  ID #:       [id number]                          D.O.B.:  2000-04-26 (22 yrs)(F)  Name:  LORAN AUGUSTE [facility]                 Visit Date: 10/06/2023 12:45 pm ---------------------------------------------------------------------- Performed By  Attending:        Nathanel Fetters      Referred By:      [facility] MAU/Triage                    MD  Performed By:     Maybell Hair RDMS      [facility] ---------------------------------------------------------------------- Orders  #  Description                           Code        Ordered By  1  US  MFM OB LIMITED                     23184.98    MARDY SHROPSHIRE ----------------------------------------------------------------------  #  Order #                     Accession #                Episode #  1  [id number]                   [id number]                 [id number]  ---------------------------------------------------------------------- Indications  [redacted] weeks gestation of pregnancy                Z3A.18  Abdominal pain in pregnancy                    O99.89  Nausea  Vaginal bleeding in pregnancy, second          O46.92  trimester ---------------------------------------------------------------------- Fetal Evaluation  Num Of Fetuses:         1  Fetal Heart Rate(bpm):  150  Cardiac Activity:       Observed  Presentation:           Cephalic  Placenta:               Anterior previa  Amniotic Fluid  AFI FV:      Within normal limits                              Largest Pocket(cm)                              4.4 ---------------------------------------------------------------------- Gestational Age  LMP:           19w 2d        Date:  05/24/23                  EDD:   02/28/24  Best:          richelle 0d     Det. By:  Early Ultrasound  EDD:   03/08/24                                      (07/13/23) ---------------------------------------------------------------------- Anatomy  Cranium:               Appears normal         Stomach:                Appears normal, left                                                                        sided  Cerebellum:            Appears normal         Kidneys:                Appear normal  Thoracic:              Appears normal         Bladder:                Appears normal ---------------------------------------------------------------------- Cervix Uterus Adnexa  Cervix  Length:            4.7  cm.  Uterus  No abnormality visualized.  Right Ovary  Not visualized.  Left Ovary  Not visualized.  Cul De Sac  No free fluid seen.  Adnexa  No adnexal mass visualized ---------------------------------------------------------------------- Impression  Limited exam due to vaginal bleeding in the second trimester  Good fetal movement and amniotic fluid  There is an anterior placenta previa noted.  ---------------------------------------------------------------------- Recommendations  MFM Consultation after 24 weeks if placenta previa not  resolved.  Bleeding precautions. ----------------------------------------------------------------------              Nathanel Fetters, MD Electronically Signed Final Report   10/06/2023 03:00 pm ----------------------------------------------------------------------     Assessment and Plan: Principal Problem:   Symptomatic Acute blood loss anemia Active Problems:   Spontaneous miscarriage at [redacted]w[redacted]d   Chlamydia infection   Admit to Antenatal for transfusion Ordered for 2 pRBCs, will check pretransfusion CBC prior to this.  Will check post transfusion CBC at least 3-4 hours after transfusion completion. Continue treatment for Chlamydia Routine antenatal care  GLORIS HUGGER, MD, FACOG Attending Obstetrician & Gynecologist Faculty Practice, [facility]

## 2023-10-10 ENCOUNTER — Ambulatory Visit: Payer: Self-pay

## 2023-10-10 ENCOUNTER — Encounter (HOSPITAL_COMMUNITY): Payer: Self-pay | Admitting: Obstetrics & Gynecology

## 2023-10-10 ENCOUNTER — Inpatient Hospital Stay (HOSPITAL_COMMUNITY)
Admission: AD | Admit: 2023-10-10 | Discharge: 2023-10-10 | Disposition: A | Discharge status: Home / Self Care | Payer: Self-pay | Attending: Obstetrics and Gynecology | Admitting: Obstetrics and Gynecology

## 2023-10-10 DIAGNOSIS — O9089 Other complications of the puerperium, not elsewhere classified: Secondary | ICD-10-CM | POA: Diagnosis not present

## 2023-10-10 DIAGNOSIS — Z3A19 19 weeks gestation of pregnancy: Secondary | ICD-10-CM | POA: Diagnosis not present

## 2023-10-10 DIAGNOSIS — R55 Syncope and collapse: Secondary | ICD-10-CM | POA: Diagnosis not present

## 2023-10-10 DIAGNOSIS — R42 Dizziness and giddiness: Secondary | ICD-10-CM | POA: Diagnosis not present

## 2023-10-10 DIAGNOSIS — I959 Hypotension, unspecified: Secondary | ICD-10-CM

## 2023-10-10 DIAGNOSIS — R519 Headache, unspecified: Secondary | ICD-10-CM | POA: Diagnosis not present

## 2023-10-10 DIAGNOSIS — D62 Acute posthemorrhagic anemia: Secondary | ICD-10-CM

## 2023-10-10 LAB — CBC
HCT: 35 % — ABNORMAL LOW (ref 36.0–46.0)
Hemoglobin: 12.4 g/dL (ref 12.0–15.0)
MCH: 32.5 pg (ref 26.0–34.0)
MCHC: 35.4 g/dL (ref 30.0–36.0)
MCV: 91.6 fL (ref 80.0–100.0)
Platelets: 201 K/uL (ref 150–400)
RBC: 3.82 MIL/uL — ABNORMAL LOW (ref 3.87–5.11)
RDW: 14.3 % (ref 11.5–15.5)
WBC: 11.8 K/uL — ABNORMAL HIGH (ref 4.0–10.5)
nRBC: 0 % (ref 0.0–0.2)

## 2023-10-10 LAB — TYPE AND SCREEN
ABO/RH(D): O POS
ABO/RH(D): O POS
Antibody Screen: NEGATIVE
Antibody Screen: NEGATIVE
Unit division: 0
Unit division: 0

## 2023-10-10 LAB — URINALYSIS, ROUTINE W REFLEX MICROSCOPIC
Bilirubin Urine: NEGATIVE
Glucose, UA: NEGATIVE mg/dL
Ketones, ur: NEGATIVE mg/dL
Nitrite: NEGATIVE
Protein, ur: NEGATIVE mg/dL
Specific Gravity, Urine: 1.003 — ABNORMAL LOW (ref 1.005–1.030)
pH: 7 (ref 5.0–8.0)

## 2023-10-10 LAB — BPAM RBC
Blood Product Expiration Date: 202508082359
Blood Product Expiration Date: 202508092359
ISSUE DATE / TIME: 202507101211
ISSUE DATE / TIME: 202507101632
Unit Type and Rh: 5100
Unit Type and Rh: 5100

## 2023-10-10 LAB — URINALYSIS, MICROSCOPIC (REFLEX)
RBC / HPF: >50 RBC/hpf (ref 0–5)
WBC, UA: >50 WBC/hpf (ref 0–5)

## 2023-10-10 MED ORDER — DOXYCYCLINE HYCLATE 100 MG PO TABS: 100.0000 mg | ORAL_TABLET | Freq: Two times a day (BID) | ORAL | 0 refills | Status: AC

## 2023-10-10 MED ORDER — IBUPROFEN 800 MG PO TABS: 800.0000 mg | ORAL_TABLET | Freq: Three times a day (TID) | ORAL | 3 refills | Status: AC | PRN

## 2023-10-10 MED ORDER — METOCLOPRAMIDE HCL 5 MG/ML IJ SOLN
10.0000 mg | Freq: Once | INTRAMUSCULAR | Status: AC
  Administered 2023-10-10: 10 mg via INTRAVENOUS
  Filled 2023-10-10: qty 2

## 2023-10-10 MED ORDER — LACTATED RINGERS IV BOLUS
1000.0000 mL | Freq: Once | INTRAVENOUS | Status: AC
  Administered 2023-10-10: 1000 mL via INTRAVENOUS

## 2023-10-10 MED ORDER — KETOROLAC TROMETHAMINE 30 MG/ML IJ SOLN
30.0000 mg | Freq: Once | INTRAMUSCULAR | Status: AC
  Administered 2023-10-10: 30 mg via INTRAVENOUS
  Filled 2023-10-10: qty 1

## 2023-10-10 NOTE — Plan of Care (Signed)
  Problem: Education: Goal: Knowledge of disease or condition will improve Outcome: Adequate for Discharge Goal: Knowledge of the prescribed therapeutic regimen will improve Outcome: Adequate for Discharge Goal: Individualized Educational Video(s) Outcome: Adequate for Discharge   Problem: Clinical Measurements: Goal: Complications related to the disease process, condition or treatment will be avoided or minimized Outcome: Adequate for Discharge   Problem: Education: Goal: Knowledge of General Education information will improve Description: Including pain rating scale, medication(s)/side effects and non-pharmacologic comfort measures Outcome: Adequate for Discharge   Problem: Health Behavior/Discharge Planning: Goal: Ability to manage health-related needs will improve Outcome: Adequate for Discharge   Problem: Clinical Measurements: Goal: Ability to maintain clinical measurements within normal limits will improve Outcome: Adequate for Discharge Goal: Will remain free from infection Outcome: Adequate for Discharge Goal: Diagnostic test results will improve Outcome: Adequate for Discharge Goal: Respiratory complications will improve Outcome: Adequate for Discharge Goal: Cardiovascular complication will be avoided Outcome: Adequate for Discharge   Problem: Activity: Goal: Risk for activity intolerance will decrease Outcome: Adequate for Discharge   Problem: Nutrition: Goal: Adequate nutrition will be maintained Outcome: Adequate for Discharge   Problem: Coping: Goal: Level of anxiety will decrease Outcome: Adequate for Discharge   Problem: Elimination: Goal: Will not experience complications related to bowel motility Outcome: Adequate for Discharge Goal: Will not experience complications related to urinary retention Outcome: Adequate for Discharge   Problem: Pain Managment: Goal: General experience of comfort will improve and/or be controlled Outcome: Adequate for  Discharge   Problem: Safety: Goal: Ability to remain free from injury will improve Outcome: Adequate for Discharge   Problem: Skin Integrity: Goal: Risk for impaired skin integrity will decrease Outcome: Adequate for Discharge

## 2023-10-10 NOTE — Telephone Encounter (Signed)
 FYI Only or Action Required?: FYI only for provider.  Patient was last seen in primary care on 05/20/2021 by Lavell Bari LABOR, FNP.  Called Nurse Triage reporting Hypertension.  Symptoms began today.  Interventions attempted: Nothing.  Symptoms are: gradually worsening.  Triage Disposition: Go to ED Now (or PCP Triage)  Patient/caregiver understands and will follow disposition?: Yes   Copied from CRM [id number]. Topic: Clinical - Red Word Triage >> Oct 10, 2023  4:56 PM Delon HERO wrote: Red Word that prompted transfer to Nurse Triage: Patient is calling to report admission 07/10/-10/10/2023. Reporting that her Blood Pressure has dropped again with 80/50 with dizziness and, headache. And vaginal blood clots that started about 2 hours ago. Reason for Disposition  [1] Fall in systolic BP > 20 mm Hg from normal AND [2] feeling weak or lightheaded  Answer Assessment - Initial Assessment Questions Patient says she was discharged a few hours ago from having a miscarriage and staying in the hospital due to vaginal bleeding. She says her blood pressure was in the 100's when she left, which is low for her. She says at home she was walking around and noticed bleeding with blood clots not that large, then she felt dizzy with a headache, her BP 80/50 then 82/50. She says she was told to call her OB, go back to the ED or call us . Advised to go to the ED on the OB side and if she doesn't need to be there they will have her sent to the regular ED side. She verbalized understanding.   1. BLOOD PRESSURE: What is your blood pressure? Did you take at least two measurements 5 minutes apart?     82/50 2. ONSET: When did you take your blood pressure?     15 min ago 3. HOW: How did you take your blood pressure? (e.g., visiting nurse, automatic home BP monitor)     BP cuff at home 4. MEDICINES: Are you taking any medicines for blood pressure? If Yes, ask: Have they been changed recently?     No 6.  OTHER SYMPTOMS: Have you been sick recently? Have you had a recent injury?     Vaginal bleeding clots, dizziness, headache 7. PREGNANCY: Is there any chance you are pregnant? When was your last menstrual period?     No  Protocols used: Blood Pressure - Low-A-AH

## 2023-10-10 NOTE — Discharge Summary (Signed)
 Gynecology Physician Discharge Summary  Patient ID: Hannah Walker MRN: [id number] DOB/AGE: 2000/08/21 23 y.o.  Admit Date: 10/09/2023 Discharge Date: 10/10/2023  Admission Diagnoses: Spontaneous abortion Acute blood loss anemia [D62]  Hospital Course:  CONNI KNIGHTON is a 23 y.o. H2E8847  who presented after delivery of [redacted]w[redacted]d fetus at home. She arrived via EMS with placenta in place, this was delivered in MAU and her bleeding resolved. Total EBL 1L. She was symptomatic despite Hgb 10.4, so she was transfusion 2u pRBCs. Posttransfusion CBC 10.9. On day of discharge, she was ambulating without difficulty and symptoms resolved. She was discharged on HD2 in good condition.  Significant Labs:    Latest Ref Rng & Units 10/09/2023   11:31 PM 10/09/2023   11:12 AM 10/09/2023    5:38 AM  CBC  WBC 4.0 - 10.5 K/uL 10.4  15.3  20.1   Hemoglobin 12.0 - 15.0 g/dL 89.0  89.5  87.7   Hematocrit 36.0 - 46.0 % 30.9  30.1  35.3   Platelets 150 - 400 K/uL 174  209  235     Discharge Exam: Blood pressure (!) 95/52, pulse 62, temperature 98.2 F (36.8 C), temperature source Oral, resp. rate 16, height 5' 5 (1.651 m), last menstrual period 05/24/2023, SpO2 100%, unknown if currently breastfeeding. General appearance: alert and no distress  Resp: clear to auscultation bilaterally  Cardio: regular rate and rhythm  GI: soft, non-tender; bowel sounds normal; no masses, no organomegaly.  Incision: C/D/I, no erythema, no drainage noted Pelvic: scant blood on pad (done in presence of RN as chaperone)  Extremities: extremities normal, atraumatic, no cyanosis or edema and Homans sign is negative, no sign of DVT  Discharged Condition: Stable  Disposition:  Discharge disposition: 01-Home or Self Care       Discharge Instructions     Activity as tolerated - No restrictions   Complete by: As directed    Call MD for:  difficulty breathing, headache or visual disturbances   Complete by: As directed     Call MD for:  persistant nausea and vomiting   Complete by: As directed    Call MD for:  redness, tenderness, or signs of infection (pain, swelling, redness, odor or green/yellow discharge around incision site)   Complete by: As directed    Call MD for:  severe uncontrolled pain   Complete by: As directed    Call MD for:  temperature >100.4   Complete by: As directed    Diet general   Complete by: As directed    Sexual Activity Restrictions   Complete by: As directed    Avoid sexual activity for 1-2 weeks      Allergies as of 10/10/2023       Reactions   Ampicillin Anaphylaxis   Has patient had a PCN reaction causing immediate rash, facial/tongue/throat swelling, SOB or lightheadedness with hypotension: Yes Has patient had a PCN reaction causing severe rash involving mucus membranes or skin necrosis: No Has patient had a PCN reaction that required hospitalization: No Has patient had a PCN reaction occurring within the last 10 years: Yes If all of the above answers are NO, then may proceed with Cephalosporin use.   Penicillins Anaphylaxis   Tape Rash   Adhesive tape of any kind         Medication List     STOP taking these medications    metroNIDAZOLE  500 MG tablet Commonly known as: FLAGYL    pyridOXINE  50 MG tablet Commonly  known as: VITAMIN B6   scopolamine  1 MG/3DAYS Commonly known as: TRANSDERM-SCOP       TAKE these medications    ALPRAZolam  1 MG tablet Commonly known as: XANAX  Take 1 mg by mouth at bedtime as needed for anxiety.   cholecalciferol  25 MCG (1000 UNIT) tablet Commonly known as: VITAMIN D3 Take 1,000 Units by mouth daily.   doxycycline  100 MG tablet Commonly known as: VIBRA -TABS Take 1 tablet (100 mg total) by mouth every 12 (twelve) hours for 6 days.   famotidine  20 MG tablet Commonly known as: Pepcid  Take 1 tablet (20 mg total) by mouth 2 (two) times daily as needed for heartburn or indigestion.   ibuprofen  800 MG  tablet Commonly known as: ADVIL  Take 1 tablet (800 mg total) by mouth 3 (three) times daily with meals as needed for headache, moderate pain (pain score 4-6) or cramping.   lamoTRIgine  25 MG tablet Commonly known as: LAMICTAL  Take 25 mg by mouth daily.   loperamide  2 MG capsule Commonly known as: IMODIUM  Take 2 mg by mouth as needed for diarrhea or loose stools.   sertraline  100 MG tablet Commonly known as: ZOLOFT  Take 50 mg by mouth daily.       No future appointments.  Follow-up Information     [facility] for [facility] Follow up.   Specialty: Obstetrics and Gynecology Why: As needed - please feel free to call Dr. Charlesetta Passy office with questions about your hospitalization Contact information: [address] [place]  [address] [phone]                Total discharge time: 20 minutes   Signed: Kieth Carolin, MD and Gloris Hugger, MD Obstetrician & Gynecologist, [facility] for [facility], [facility]

## 2023-10-10 NOTE — MAU Note (Signed)
 MAU Triage Note:  .KENT BRAUNSCHWEIG is a 23 y.o. at [redacted]w[redacted]d here in MAU reporting: discharged from [facility] earlier this afternoon after being admitted for delivery of [redacted]w[redacted]d fetus on 10/09/23. QBL over 1 liter. Received 2 units of pRBCs and posttransfusion hemoglobin 10.9. Came back in for headache, dizziness, nausea and vomiting, and hypotension. She also started having more bleeding today - reports that it is between moderate and heavy. Also passing frequent clots the size of a quarter.   Patient complaint: PP, Low bp, shortness breath, dizzy, headaches, blood clots  Pain Score: 5  Pain Location: Head     Onset of complaint: today LMP: No LMP recorded.  Vitals:   10/10/23 2001  BP: 103/67  Pulse: 80  Resp: 16  Temp: 98.2 F (36.8 C)  SpO2: 99%     Lab orders placed from triage: UA, EKG

## 2023-10-10 NOTE — MAU Provider Note (Signed)
 Chief Complaint: Emesis, Nausea, Headache, dizziness, and Vaginal Bleeding   Event Date/Time   First Provider Initiated Contact with Patient 10/10/23 2047      SUBJECTIVE HPI: Hannah Walker is a 23 y.o. [id number] who is postpartum day 1 following delivery of [redacted]w[redacted]d fetus at home prior to hospital arrival. She presents with dizziness, near syncope, nausea, headache, and increased vaginal bleeding. She was discharged from [facility] today but went home and symptoms worsened.    Bleeding is heavy with quarter sized clots.   Per discharge summary, pt EBL was estimated at 1 L.  Although Hgb above 10, pt was symptomatic and was transfused 2u pRBCs. Posttransfusion CBC 10.9 and pt became asymptomatic.  She was stable at time of discharge.  Then, after arriving at home, symptoms gradually returned and she had near syncopal episodes holding her 25 year old and when in the shower.  BP at home was 80s/50s.     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   Hyperemesis gravidarum 12/21/2020   Migraine without aura and without status migrainosus, not intractable 07/23/2017   PCO (polycystic ovaries)    Renal disorder    kidney stones   Urine retention    Past Surgical History:  Procedure Laterality Date   DILATION AND CURETTAGE OF UTERUS N/A 06/01/2022   Procedure: DILATATION AND EVACUATION WITH ULTRASOUND GUIDANCE, REMOVAL OF INTRAUTERINE DEVICE AND INSERTION OF INTRAUTERINE DEVICE;  Surgeon: Ozan, Jennifer, DO;  Location: [facility] OR;  Service: Gynecology;  Laterality: N/A;   DILATION AND EVACUATION N/A 04/13/2021   Procedure: DILATATION AND EVACUATION;  Surgeon: Diedre Rosaline BRAVO, MD;  Location: [facility] LD ORS;  Service: Gynecology;  Laterality: N/A;    TENDON REPAIR Right 05/15/2018   Procedure: EXTENSOR TENDON REPAIR RIGHT THUMB;  Surgeon: Lorretta Dess, MD;  Location: [facility] OR;  Service: Plastics;  Laterality: Right;   Social History   Socioeconomic History   Marital status: Single    Spouse name: Not on file   Number of children: Not on file   Years of education: Not on file   Highest education level: Not on file  Occupational History   Not on file  Tobacco Use   Smoking status: Former    Types: E-cigarettes    Quit date: 07/30/2020    Years since quitting: 3.2   Smokeless tobacco: Never  Vaping Use   Vaping status: Former   Quit date: 08/13/2020   Substances: Nicotine  Substance and Sexual Activity   Alcohol use: No   Drug use: No   Sexual activity: Not Currently  Other Topics Concern   Not on file  Social History Narrative   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   She was home schooled.   She lives with her dad only. She has no siblings.   She enjoys playing with her dogs and being on her phone.   Social Drivers of Corporate investment banker Strain: Not on file  Food Insecurity: No Food Insecurity (10/09/2023)   Hunger Vital Sign    Worried About Running Out of Food in the Last Year: Never true    Ran Out of Food in the Last Year: Never true  Transportation  Needs: No Transportation Needs (10/09/2023)   PRAPARE - Administrator, Civil Service (Medical): No    Lack of Transportation (Non-Medical): No  Physical Activity: Not on file  Stress: Not on file  Social Connections: Not on file  Intimate Partner Violence: Not At Risk (10/09/2023)   Humiliation, Afraid, Rape, and Kick questionnaire    Fear of Current or Ex-Partner: No    Emotionally Abused: No    Physically Abused: No    Sexually Abused: No   No current facility-administered medications on file prior to encounter.   Current Outpatient Medications on File Prior to Encounter  Medication Sig Dispense Refill   acetaminophen  (TYLENOL ) 500 MG tablet  Take 1,000 mg by mouth every 6 (six) hours as needed.     sertraline  (ZOLOFT ) 100 MG tablet Take 50 mg by mouth daily.     ALPRAZolam  (XANAX ) 1 MG tablet Take 1 mg by mouth at bedtime as needed for anxiety.     cholecalciferol  (VITAMIN D3) 25 MCG (1000 UNIT) tablet Take 1,000 Units by mouth daily.     famotidine  (PEPCID ) 20 MG tablet Take 1 tablet (20 mg total) by mouth 2 (two) times daily as needed for heartburn or indigestion. 30 tablet 1   lamoTRIgine  (LAMICTAL ) 25 MG tablet Take 25 mg by mouth daily.     loperamide  (IMODIUM ) 2 MG capsule Take 2 mg by mouth as needed for diarrhea or loose stools.     [DISCONTINUED] ESTARYLLA 0.25-35 MG-MCG tablet TK 1 T PO D     [DISCONTINUED] etonogestrel (NEXPLANON) 68 MG IMPL implant Nexplanon 68 mg subdermal implant  Inject 1 implant by subcutaneous route.     [DISCONTINUED] topiramate  (TOPAMAX ) 25 MG tablet TAKE 4 TABLETS BY MOUTH AT NIGHT TIME 124 tablet 0   Allergies  Allergen Reactions   Ampicillin Anaphylaxis    Has patient had a PCN reaction causing immediate rash, facial/tongue/throat swelling, SOB or lightheadedness with hypotension: Yes Has patient had a PCN reaction causing severe rash involving mucus membranes or skin necrosis: No Has patient had a PCN reaction that required hospitalization: No Has patient had a PCN reaction occurring within the last 10 years: Yes If all of the above answers are NO, then may proceed with Cephalosporin use.    Penicillins Anaphylaxis   Tape Rash    Adhesive tape of any kind     ROS:  Review of Systems  Constitutional:  Negative for chills, fatigue and fever.  Respiratory:  Negative for shortness of breath.   Cardiovascular:  Negative for chest pain.  Gastrointestinal:  Positive for nausea and vomiting.  Genitourinary:  Positive for vaginal bleeding. Negative for difficulty urinating, dysuria, flank pain, pelvic pain, vaginal discharge and vaginal pain.  Neurological:  Positive for dizziness and  headaches.  Psychiatric/Behavioral: Negative.       I have reviewed patient's Past Medical Hx, Surgical Hx, Family Hx, Social Hx, medications and allergies.   Physical Exam  Patient Vitals for the past 24 hrs:  BP Temp Temp src Pulse Resp SpO2 Height Weight  10/10/23 2015 109/68 -- -- 81 -- -- -- --  10/10/23 2001 103/67 98.2 F (36.8 C) Oral 80 16 99 % 5' 5 (1.651 m) 71.4 kg   Constitutional: Well-developed, well-nourished female in no acute distress.  Cardiovascular: normal rate Respiratory: normal effort GI: Abd soft, non-tender. Pos BS x 4 MS: Extremities nontender, no edema, normal ROM Neurologic: Alert and oriented x 4.  GU: Neg CVAT.  PELVIC EXAM:  Deferred   LAB RESULTS Results for orders placed or performed during the hospital encounter of 10/10/23 (from the past 24 hours)  Urinalysis, Routine w reflex microscopic -Urine, Clean Catch     Status: Abnormal   Collection Time: 10/10/23  8:04 PM  Result Value Ref Range   Color, Urine PINK (A) YELLOW   APPearance CLEAR CLEAR   Specific Gravity, Urine 1.003 (L) 1.005 - 1.030   pH 7.0 5.0 - 8.0   Glucose, UA NEGATIVE NEGATIVE mg/dL   Hgb urine dipstick LARGE (A) NEGATIVE   Bilirubin Urine NEGATIVE NEGATIVE   Ketones, ur NEGATIVE NEGATIVE mg/dL   Protein, ur NEGATIVE NEGATIVE mg/dL   Nitrite NEGATIVE NEGATIVE   Leukocytes,Ua MODERATE (A) NEGATIVE  Urinalysis, Microscopic (reflex)     Status: Abnormal   Collection Time: 10/10/23  8:04 PM  Result Value Ref Range   RBC / HPF >50 0 - 5 RBC/hpf   WBC, UA >50 0 - 5 WBC/hpf   Bacteria, UA RARE (A) NONE SEEN   Squamous Epithelial / HPF 0-5 0 - 5 /HPF   Mucus PRESENT   Type and screen     Status: None   Collection Time: 10/10/23  8:56 PM  Result Value Ref Range   ABO/RH(D) O POS    Antibody Screen NEG    Sample Expiration      10/13/2023,2359 Performed at [facility], [address] [place], [place] [address]   CBC     Status: Abnormal   Collection Time:  10/10/23  8:58 PM  Result Value Ref Range   WBC 11.8 (H) 4.0 - 10.5 K/uL   RBC 3.82 (L) 3.87 - 5.11 MIL/uL   Hemoglobin 12.4 12.0 - 15.0 g/dL   HCT 64.9 (L) 63.9 - 53.9 %   MCV 91.6 80.0 - 100.0 fL   MCH 32.5 26.0 - 34.0 pg   MCHC 35.4 30.0 - 36.0 g/dL   RDW 85.6 88.4 - 84.4 %   Platelets 201 150 - 400 K/uL   nRBC 0.0 0.0 - 0.2 %    --/--/O POS (07/11 2056)  IMAGING   MAU Management/MDM: Orders Placed This Encounter  Procedures   Urinalysis, Routine w reflex microscopic -Urine, Clean Catch   CBC   Urinalysis, Microscopic (reflex)   ED EKG   Type and screen   Discharge patient Discharge disposition: 01-Home or Self Care; Discharge patient date: 10/10/2023    Meds ordered this encounter  Medications   lactated ringers  bolus 1,000 mL   metoCLOPramide  (REGLAN ) injection 10 mg   ketorolac  (TORADOL ) 30 MG/ML injection 30 mg    Hgb 12.4 today, no evidence of anemia. BP wnl in MAU. Pt given antiemetics and Tylenol  and symptoms improved but not resolved. Pt to go home and rest, return precautions reviewed.  Increase PO fluids. Keep scheduled appts in the office.    ASSESSMENT 1. Dizziness   2. Preterm delivery   3. Acute nonintractable headache, unspecified headache type   4. Postural dizziness with near syncope   5. Hypotension, unspecified hypotension type     PLAN Discharge home Pt had to leave MAU before provider put in discharge order due to her 25 year old hurting his arm and husband taking him to urgent care Attempt to place discharge order but it is 2 hours after pt discharged Pt did not leave AMA and met criteria for discharge   Follow-up Information     Your OB/Gyn providers at [facility] Follow up in 2  week(s).          [facility] Follow up.   Specialty: Obstetrics and Gynecology Why: As needed for emergencies Contact information: [address] [place]  [address] [phone]                Olam Boards Certified Nurse-Midwife 10/11/2023  3:17 AM

## 2023-10-10 NOTE — Progress Notes (Signed)
   10/10/23 1350  Departure Condition  Departure Condition Good  Mobility at [facility]  Patient/Caregiver Teaching Teach Back Method Used;Discharge instructions reviewed;Prescriptions reviewed;Follow-up care reviewed;Patient/caregiver verbalized understanding;Medications discussed;Pain management discussed  Special teaching needs: Emotional  Departure Mode With family  Was procedural sedation performed on this patient during this visit? No   Patient alert and oriented x4, VS and pain stable at discharge.

## 2023-10-10 NOTE — Progress Notes (Addendum)
 FACULTY PRACTICE GYNECOLOGY PROGRESS NOTE  Hannah Walker is a 23 y.o. [phone] s/p SAB at [redacted]w[redacted]d on 7/10 c/b hemorrhage with EBL 1L  Length of Stay:  0 Days. Admitted 10/09/2023  Subjective: S/p 2u pRBCs yesterday evening. Still felt pretty dizzy when she got up to the bathroom around 4am. Otherwise doing ok. No further heavy vaginal bleeding.  Vitals:  Blood pressure (!) 89/49, pulse (!) 52, temperature 97.8 F (36.6 C), temperature source Oral, resp. rate 16, height 5' 5 (1.651 m), last menstrual period 05/24/2023, SpO2 100%, unknown if currently breastfeeding. Physical Examination: CONSTITUTIONAL: Resting comfortably in bed, well appearing, no acute distress CARDIOVASCULAR: Mild bradycardia  RESPIRATORY: Effort normal, no problems with respiration noted ABDOMEN: Soft, nontender, nondistended  Results for orders placed or performed during the hospital encounter of 10/09/23 (from the past 48 hours)  Type and screen [facility]     Status: None (Preliminary result)   Collection Time: 10/09/23  5:36 AM  Result Value Ref Range   ABO/RH(D) O POS    Antibody Screen NEG    Sample Expiration 10/12/2023,2359    Unit Number [id number]    Blood Component Type RED CELLS,LR    Unit division 00    Status of Unit ISSUED    Transfusion Status OK TO TRANSFUSE    Crossmatch Result Compatible    Unit Number [id number]    Blood Component Type RBC LR PHER2    Unit division 00    Status of Unit ISSUED    Transfusion Status OK TO TRANSFUSE    Crossmatch Result      Compatible Performed at [facility], [address] [place], [place] [address]   CBC     Status: Abnormal   Collection Time: 10/09/23  5:38 AM  Result Value Ref Range   WBC 20.1 (H) 4.0 - 10.5 K/uL   RBC 3.89 3.87 - 5.11 MIL/uL   Hemoglobin 12.2 12.0 - 15.0 g/dL   HCT 64.6 (L) 63.9 - 53.9 %   MCV 90.7 80.0 - 100.0 fL   MCH 31.4 26.0 - 34.0 pg   MCHC 34.6 30.0 - 36.0 g/dL   RDW 86.1 88.4 - 84.4 %    Platelets 235 150 - 400 K/uL   nRBC 0.0 0.0 - 0.2 %    Comment: Performed at [facility], [address] [place], [place] [address]  Prepare RBC (crossmatch)     Status: None   Collection Time: 10/09/23 10:05 AM  Result Value Ref Range   Order Confirmation      ORDER PROCESSED BY BLOOD BANK Performed at [facility], [address] [place], [place] [address]   CBC     Status: Abnormal   Collection Time: 10/09/23 11:12 AM  Result Value Ref Range   WBC 15.3 (H) 4.0 - 10.5 K/uL   RBC 3.34 (L) 3.87 - 5.11 MIL/uL   Hemoglobin 10.4 (L) 12.0 - 15.0 g/dL   HCT 69.8 (L) 63.9 - 53.9 %   MCV 90.1 80.0 - 100.0 fL   MCH 31.1 26.0 - 34.0 pg   MCHC 34.6 30.0 - 36.0 g/dL   RDW 86.3 88.4 - 84.4 %   Platelets 209 150 - 400 K/uL   nRBC 0.0 0.0 - 0.2 %    Comment: Performed at [facility], [address], [place], [place] [address]  CBC     Status: Abnormal   Collection Time: 10/09/23 11:31 PM  Result Value Ref Range   WBC 10.4 4.0 - 10.5 K/uL   RBC 3.47 (L) 3.87 - 5.11 MIL/uL   Hemoglobin 10.9 (L) 12.0 - 15.0 g/dL   HCT 69.0 (L) 63.9 - 53.9 %   MCV 89.0 80.0 - 100.0 fL   MCH 31.4 26.0 - 34.0 pg   MCHC 35.3 30.0 - 36.0 g/dL   RDW 86.0 88.4 - 84.4 %   Platelets 174 150 - 400 K/uL   nRBC 0.0 0.0 - 0.2 %    Comment: Performed at [facility], [address], [place], [place] [address]    No results found.  Current scheduled medications  cholecalciferol   1,000 Units Oral Daily   docusate sodium   100 mg Oral Daily   doxycycline   100 mg Oral Q12H   metroNIDAZOLE   500 mg Oral BID   pyridOXINE   50 mg Oral Daily   scopolamine   1 patch Transdermal Q72H   sertraline   50 mg Oral Daily    I have reviewed the patient's current medications.  ASSESSMENT: Principal Problem:   Symptomatic Acute blood loss anemia Active Problems:   Spontaneous miscarriage at [redacted]w[redacted]d   Chlamydia infection   PLAN: S/p 2u pRBCs, post tranfusion Hgb 10.9 Offered TOC or chaplain for  support - she has appt with her established therapist on Monday, declines additional support at this time Continue doxy for chlamydia x 7d Anticipate discharge home today pending improvement in symptoms  Continue routine antenatal care.  Kieth Carolin, MD Obstetrician & Gynecologist, [facility] for [employer], [facility]

## 2023-10-15 LAB — SURGICAL PATHOLOGY

## 2023-11-10 DIAGNOSIS — N939 Abnormal uterine and vaginal bleeding, unspecified: Secondary | ICD-10-CM | POA: Diagnosis not present

## 2023-11-10 DIAGNOSIS — Z98891 History of uterine scar from previous surgery: Secondary | ICD-10-CM | POA: Diagnosis not present

## 2023-11-12 DIAGNOSIS — F39 Unspecified mood [affective] disorder: Secondary | ICD-10-CM | POA: Diagnosis not present

## 2023-11-12 DIAGNOSIS — F419 Anxiety disorder, unspecified: Secondary | ICD-10-CM | POA: Diagnosis not present

## 2023-11-12 DIAGNOSIS — F331 Major depressive disorder, recurrent, moderate: Secondary | ICD-10-CM | POA: Diagnosis not present

## 2023-12-12 DIAGNOSIS — U071 COVID-19: Secondary | ICD-10-CM | POA: Diagnosis not present

## 2023-12-16 DIAGNOSIS — Z8759 Personal history of other complications of pregnancy, childbirth and the puerperium: Secondary | ICD-10-CM | POA: Diagnosis not present

## 2023-12-16 DIAGNOSIS — G479 Sleep disorder, unspecified: Secondary | ICD-10-CM | POA: Diagnosis not present

## 2023-12-16 DIAGNOSIS — Z8659 Personal history of other mental and behavioral disorders: Secondary | ICD-10-CM | POA: Diagnosis not present

## 2023-12-16 DIAGNOSIS — F331 Major depressive disorder, recurrent, moderate: Secondary | ICD-10-CM | POA: Diagnosis not present

## 2024-01-16 DIAGNOSIS — N898 Other specified noninflammatory disorders of vagina: Secondary | ICD-10-CM | POA: Diagnosis not present

## 2024-01-16 DIAGNOSIS — R350 Frequency of micturition: Secondary | ICD-10-CM | POA: Diagnosis not present

## 2024-01-16 DIAGNOSIS — Z30430 Encounter for insertion of intrauterine contraceptive device: Secondary | ICD-10-CM | POA: Diagnosis not present

## 2024-02-10 DIAGNOSIS — G479 Sleep disorder, unspecified: Secondary | ICD-10-CM | POA: Diagnosis not present

## 2024-02-10 DIAGNOSIS — F331 Major depressive disorder, recurrent, moderate: Secondary | ICD-10-CM | POA: Diagnosis not present

## 2024-02-10 DIAGNOSIS — Z8659 Personal history of other mental and behavioral disorders: Secondary | ICD-10-CM | POA: Diagnosis not present

## 2024-02-10 DIAGNOSIS — F39 Unspecified mood [affective] disorder: Secondary | ICD-10-CM | POA: Diagnosis not present

## 2024-02-10 DIAGNOSIS — Z8759 Personal history of other complications of pregnancy, childbirth and the puerperium: Secondary | ICD-10-CM | POA: Diagnosis not present
